# Patient Record
Sex: Male | Born: 1975 | Race: Black or African American | Hispanic: No | Smoking: Never smoker
Health system: Southern US, Community
[De-identification: ages and names within clinical notes are randomized; demographics above are authoritative.]

## PROBLEM LIST (undated history)

## (undated) DIAGNOSIS — S5401XA Injury of ulnar nerve at forearm level, right arm, initial encounter: Secondary | ICD-10-CM

## (undated) DIAGNOSIS — M1990 Unspecified osteoarthritis, unspecified site: Secondary | ICD-10-CM

## (undated) DIAGNOSIS — I82409 Acute embolism and thrombosis of unspecified deep veins of unspecified lower extremity: Secondary | ICD-10-CM

## (undated) DIAGNOSIS — IMO0002 Reserved for concepts with insufficient information to code with codable children: Secondary | ICD-10-CM

## (undated) DIAGNOSIS — I10 Essential (primary) hypertension: Secondary | ICD-10-CM

## (undated) DIAGNOSIS — F0781 Postconcussional syndrome: Secondary | ICD-10-CM

## (undated) DIAGNOSIS — S12500A Unspecified displaced fracture of sixth cervical vertebra, initial encounter for closed fracture: Secondary | ICD-10-CM

## (undated) DIAGNOSIS — E119 Type 2 diabetes mellitus without complications: Secondary | ICD-10-CM

## (undated) HISTORY — DX: Reserved for concepts with insufficient information to code with codable children: IMO0002

## (undated) HISTORY — DX: Essential (primary) hypertension: I10

## (undated) HISTORY — PX: SHOULDER ARTHROSCOPY: SHX128

## (undated) HISTORY — DX: Postconcussional syndrome: F07.81

## (undated) HISTORY — DX: Injury of ulnar nerve at forearm level, right arm, initial encounter: S54.01XA

## (undated) HISTORY — DX: Acute embolism and thrombosis of unspecified deep veins of unspecified lower extremity: I82.409

## (undated) HISTORY — DX: Unspecified displaced fracture of sixth cervical vertebra, initial encounter for closed fracture: S12.500A

## (undated) HISTORY — DX: Type 2 diabetes mellitus without complications: E11.9

## (undated) HISTORY — DX: Unspecified osteoarthritis, unspecified site: M19.90

## (undated) HISTORY — PX: KNEE ARTHROSCOPY: SUR90

---

## 2008-04-08 ENCOUNTER — Ambulatory Visit: Payer: Self-pay | Admitting: Internal Medicine

## 2008-04-08 DIAGNOSIS — E119 Type 2 diabetes mellitus without complications: Secondary | ICD-10-CM

## 2008-04-08 DIAGNOSIS — I152 Hypertension secondary to endocrine disorders: Secondary | ICD-10-CM | POA: Insufficient documentation

## 2008-04-08 DIAGNOSIS — I10 Essential (primary) hypertension: Secondary | ICD-10-CM

## 2008-04-08 DIAGNOSIS — M1990 Unspecified osteoarthritis, unspecified site: Secondary | ICD-10-CM

## 2008-04-08 DIAGNOSIS — E1159 Type 2 diabetes mellitus with other circulatory complications: Secondary | ICD-10-CM | POA: Insufficient documentation

## 2008-04-08 HISTORY — DX: Type 2 diabetes mellitus without complications: E11.9

## 2008-04-08 HISTORY — DX: Unspecified osteoarthritis, unspecified site: M19.90

## 2008-04-08 HISTORY — DX: Essential (primary) hypertension: I10

## 2008-04-08 LAB — CONVERTED CEMR LAB: Hgb A1c MFr Bld: 11 % — ABNORMAL HIGH (ref 4.6–6.0)

## 2008-04-11 ENCOUNTER — Telehealth (INDEPENDENT_AMBULATORY_CARE_PROVIDER_SITE_OTHER): Payer: Self-pay

## 2008-08-19 ENCOUNTER — Ambulatory Visit: Payer: Self-pay | Admitting: Internal Medicine

## 2008-08-19 LAB — HM DIABETES EYE EXAM: HM Diabetic Eye Exam: NORMAL

## 2008-09-05 ENCOUNTER — Encounter: Payer: Self-pay | Admitting: Internal Medicine

## 2008-10-05 ENCOUNTER — Telehealth: Payer: Self-pay | Admitting: Internal Medicine

## 2008-11-11 ENCOUNTER — Ambulatory Visit: Payer: Self-pay | Admitting: Internal Medicine

## 2009-07-14 ENCOUNTER — Telehealth: Payer: Self-pay | Admitting: Internal Medicine

## 2009-07-25 ENCOUNTER — Ambulatory Visit: Payer: Self-pay | Admitting: Internal Medicine

## 2009-07-25 LAB — CONVERTED CEMR LAB: Blood Glucose, Fingerstick: 236

## 2009-09-12 ENCOUNTER — Encounter: Admission: RE | Admit: 2009-09-12 | Discharge: 2009-09-12 | Payer: Self-pay | Admitting: Infectious Diseases

## 2010-01-08 ENCOUNTER — Telehealth: Payer: Self-pay | Admitting: Internal Medicine

## 2010-01-14 ENCOUNTER — Emergency Department (HOSPITAL_COMMUNITY): Admission: EM | Admit: 2010-01-14 | Discharge: 2010-01-15 | Payer: Self-pay | Admitting: Emergency Medicine

## 2010-03-27 NOTE — Assessment & Plan Note (Signed)
Summary: MED CHECK/REFILLS/CJR   Vital Signs:  Patient profile:   35 year old male Weight:      281 pounds Temp:     98.2 degrees F oral BP sitting:   100 / 80  (left arm) Cuff size:   large  Vitals Entered By: Duard Brady LPN (Jul 25, 2009 4:02 PM) CC: medication review, check blood sugar - not taking meds as scheduled Is Patient Diabetic? Yes CBG Result 236   CC:  medication review and check blood sugar - not taking meds as scheduled.  History of Present Illness: 35 year old patient who has a history of type 2 diabetes and hypertension.  He is not been seen here in the number of months.  He has been off most of his medication for per days to weeks.  Random blood sugar 236.  His last hemoglobin A1c last fall was in excess of 10.  he feels quite well. He has had a difficult time keeping appointments and maintaining a proper diet due to [place].  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 Amoxicillin (Amoxicillin)  Past History:  Past Medical History: Reviewed history from 04/08/2008 and no changes required. Diabetes mellitus, type II Hypertension Osteoarthritis  Family History: Reviewed history from 04/08/2008 and no changes required. father died age 32 of Legionella pneumonia, history of hypercholesterolemia mother is 23, hypothyroidism, history of colon cancer  Social History: Smoking Status:  never  Review of Systems       The patient complains of weight gain.  The patient denies anorexia, fever, weight loss, vision loss, decreased hearing, hoarseness, chest pain, syncope, dyspnea on exertion, peripheral edema, prolonged cough, headaches, hemoptysis, abdominal pain, melena, hematochezia, severe indigestion/heartburn, hematuria, incontinence, genital sores, muscle weakness, suspicious skin lesions, transient blindness, difficulty walking, depression,  unusual weight change, abnormal bleeding, enlarged lymph nodes, angioedema, breast masses, and testicular masses.    Physical Exam  General:  overweight-appearing.  blood pressure low normaloverweight-appearing.   Head:  Normocephalic and atraumatic without obvious abnormalities. No apparent alopecia or balding. Eyes:  No corneal or conjunctival inflammation noted. EOMI. Perrla. Funduscopic exam benign, without hemorrhages, exudates or papilledema. Vision grossly normal. Mouth:  Oral mucosa and oropharynx without lesions or exudates.  Teeth in good repair. Neck:  No deformities, masses, or tenderness noted. Lungs:  Normal respiratory effort, chest expands symmetrically. Lungs are clear to auscultation, no crackles or wheezes. Heart:  Normal rate and regular rhythm. S1 and S2 normal without gallop, murmur, click, rub or other extra sounds. Abdomen:  Bowel sounds positive,abdomen soft and non-tender without masses, organomegaly or hernias noted. Msk:  No deformity or scoliosis noted of thoracic or lumbar spine.   Pulses:  R and L carotid,radial,femoral,dorsalis pedis and posterior tibial pulses are full and equal bilaterally Skin:  Intact without suspicious lesions or rashes  Diabetes Management Exam:    Foot Exam (with socks and/or shoes not present):       Sensory-Pinprick/Light touch:          Left medial foot (L-4): normal          Left dorsal foot (L-5): normal          Left lateral foot (S-1): normal          Right medial foot (L-4): normal          Right dorsal foot (L-5): normal  Right lateral foot (S-1): normal       Sensory-Monofilament:          Left foot: normal          Right foot: normal       Inspection:          Left foot: normal          Right foot: normal       Nails:          Left foot: normal          Right foot: normal    Foot Exam by Podiatrist:       Date: 07/25/2009       Results: no diabetic findings       Done by: PCP   Complete Medication  List: 1)  Victoza 18 Mg/3ml Soln (Liraglutide) .... Use daily 2)  Actos 45 Mg Tabs (Pioglitazone hcl) .... One daily 3)  Metformin Hcl 1000 Mg Tabs (Metformin hcl) .... One twice daily 4)  Novofine 32g X 6 Mm Misc (Insulin pen needle) .... Uad 5)  Glipizide Xl 10 Mg Xr24h-tab (Glipizide) .... One daily 6)  Lisinopril 20 Mg Tabs (Lisinopril) .... One daily  Other Orders: Capillary Blood Glucose/CBG (64332)  Patient Instructions: 1)  Please schedule a follow-up appointment in 3 months. 2)  Limit your Sodium (Salt) to less than 2 grams a day(slightly less than 1/2 a teaspoon) to prevent fluid retention, swelling, or worsening of symptoms. 3)  It is important that you exercise regularly at least 20 minutes 5 times a week. If you develop chest pain, have severe difficulty breathing, or feel very tired , stop exercising immediately and seek medical attention. 4)  You need to lose weight. Consider a lower calorie diet and regular exercise.  5)  Check your blood sugars regularly. If your readings are usually above : or below 70 you should contact our office. 6)  It is important that your Diabetic A1c level is checked every 3 months. 7)  See your eye doctor yearly to check for diabetic eye damage. Prescriptions: LISINOPRIL 20 MG TABS (LISINOPRIL) one daily  #90 x 6   Entered and Authorized by:   [name]  MD   Signed by:   [name]  MD on 07/25/2009   Method used:   Electronically to        [facility] [address] [facility] [address] #[id number]* (retail)       [address] [place], [place]  [address]       Ph: [phone]       Fax: [phone]   RxID:   [id number] GLIPIZIDE XL 10 MG XR24H-TAB (GLIPIZIDE) one daily  #90 x 6   Entered and Authorized by:   [name]  MD   Signed by:   [name]  MD on 07/25/2009   Method used:   Electronically to        [facility] [address] #[id number]* (retail)       [address] [place], [place]  [address]       Ph:  [phone]       Fax: [phone]   RxID:   [id number] NOVOFINE 32G X 6 MM MISC (INSULIN PEN NEEDLE) UAD  #100 x 6   Entered and Authorized by:   [name]  MD   Signed by:   [name]  MD on 07/25/2009  Method used:   Electronically to        [facility] #[id number]* (retail)       [address] [place], [place]  [address]       Ph: [phone]       Fax: [phone]   RxID:   [id number] METFORMIN HCL 1000 MG TABS (METFORMIN HCL) one twice daily  #180 x 6   Entered and Authorized by:   [name]  MD   Signed by:   [name]  MD on 07/25/2009   Method used:   Electronically to        [facility] [address] #[id number]* (retail)       [address] [place], [place]  [address]       Ph: [phone]       Fax: [phone]   RxID:   [id number] ACTOS 45 MG TABS (PIOGLITAZONE HCL) one daily  #90 x 6   Entered and Authorized by:   [name]  MD   Signed by:   [name]  MD on 07/25/2009   Method used:   Electronically to        [facility] [address] #[id number]* (retail)       [address] [place], [place]  [address]       Ph: [phone]       Fax: [phone]   RxID:   [id number] JIRCVEL 38 MG/3ML SOLN (LIRAGLUTIDE) use daily  #3 x 6   Entered and Authorized by:   [name]  MD   Signed by:   [name]  MD on 07/25/2009   Method used:   Electronically to        [facility] [id number]* (retail)       [address] [place], [place]  [address]       Ph: [phone]       Fax: [phone]   RxID:   [id number]   Appended Document: MED CHECK/REFILLS/CJR Impression:  DM2- Poor control Noncompliance HTN

## 2010-03-27 NOTE — Progress Notes (Signed)
Summary: refill novofine needles   Phone Note Refill Request Message from:  Fax from Pharmacy on January 08, 2010 1:20 PM  Refills Requested: Medication #1:  NOVOFINE 32G X 6 MM MISC UAD [facility]    Method Requested: Fax to Local Pharmacy Initial call taken by: Duard Brady LPN,  January 08, 2010 1:20 PM    Prescriptions: NOVOFINE 32G X 6 MM MISC (INSULIN PEN NEEDLE) UAD  #100 x 6   Entered by:   Duard Brady LPN   Authorized by:   [name]  MD   Signed by:   Duard Brady LPN on 29/51/8841   Method used:   Historical   RxID:   [id number]  faxed back to [facility]   KIk

## 2010-03-27 NOTE — Progress Notes (Signed)
Summary: No Call No Show  Phone Note Outgoing Call   Call placed by: Duard Brady LPN,  Jul 14, 2009 4:58 PM Call placed to: Patient Summary of Call: NO Call No Show - rov , attempt to call - ans mach - LMTCB and r/s   Initial call taken by: Duard Brady LPN,  Jul 14, 2009 4:59 PM

## 2010-05-08 LAB — POCT I-STAT, CHEM 8
Calcium, Ion: 1.08 mmol/L — ABNORMAL LOW (ref 1.12–1.32)
Chloride: 100 mEq/L (ref 96–112)
Glucose, Bld: 450 mg/dL — ABNORMAL HIGH (ref 70–99)
HCT: 46 % (ref 39.0–52.0)
Hemoglobin: 15.6 g/dL (ref 13.0–17.0)
TCO2: 26 mmol/L (ref 0–100)

## 2010-05-08 LAB — CBC
HCT: 42.8 % (ref 39.0–52.0)
Hemoglobin: 15.2 g/dL (ref 13.0–17.0)
MCH: 31.4 pg (ref 26.0–34.0)
MCHC: 35.5 g/dL (ref 30.0–36.0)
MCV: 88.4 fL (ref 78.0–100.0)
RDW: 12.1 % (ref 11.5–15.5)

## 2010-05-08 LAB — DIFFERENTIAL
Basophils Absolute: 0 10*3/uL (ref 0.0–0.1)
Basophils Relative: 0 % (ref 0–1)
Eosinophils Absolute: 0.1 10*3/uL (ref 0.0–0.7)
Eosinophils Relative: 1 % (ref 0–5)
Monocytes Absolute: 0.9 10*3/uL (ref 0.1–1.0)
Monocytes Relative: 10 % (ref 3–12)
Neutro Abs: 5.2 10*3/uL (ref 1.7–7.7)

## 2010-05-08 LAB — GLUCOSE, CAPILLARY: Glucose-Capillary: 320 mg/dL — ABNORMAL HIGH (ref 70–99)

## 2010-08-12 ENCOUNTER — Other Ambulatory Visit: Payer: Self-pay | Admitting: Internal Medicine

## 2011-02-02 ENCOUNTER — Other Ambulatory Visit: Payer: Self-pay | Admitting: Internal Medicine

## 2011-02-04 NOTE — Telephone Encounter (Signed)
Must be seen for future refills - last seen 06/2009

## 2011-02-05 ENCOUNTER — Encounter: Payer: Self-pay | Admitting: Internal Medicine

## 2011-02-05 ENCOUNTER — Ambulatory Visit (INDEPENDENT_AMBULATORY_CARE_PROVIDER_SITE_OTHER): Payer: Self-pay | Admitting: Internal Medicine

## 2011-02-05 VITALS — BP 130/90 | Temp 98.3°F | Ht 76.25 in | Wt 257.0 lb

## 2011-02-05 DIAGNOSIS — I10 Essential (primary) hypertension: Secondary | ICD-10-CM

## 2011-02-05 DIAGNOSIS — E1169 Type 2 diabetes mellitus with other specified complication: Secondary | ICD-10-CM

## 2011-02-05 DIAGNOSIS — E119 Type 2 diabetes mellitus without complications: Secondary | ICD-10-CM

## 2011-02-05 MED ORDER — INSULIN PEN NEEDLE 32G X 6 MM MISC: Status: DC

## 2011-02-05 MED ORDER — GLIPIZIDE ER 10 MG PO TB24: 10.0000 mg | ORAL_TABLET | Freq: Every day | ORAL | Status: DC

## 2011-02-05 MED ORDER — METFORMIN HCL 1000 MG PO TABS: 1000.0000 mg | ORAL_TABLET | Freq: Two times a day (BID) | ORAL | Status: DC

## 2011-02-05 MED ORDER — LISINOPRIL 20 MG PO TABS: 20.0000 mg | ORAL_TABLET | Freq: Every day | ORAL | Status: DC

## 2011-02-05 MED ORDER — PIOGLITAZONE HCL 45 MG PO TABS: 45.0000 mg | ORAL_TABLET | Freq: Every day | ORAL | Status: DC

## 2011-02-05 MED ORDER — LIRAGLUTIDE 18 MG/3ML ~~LOC~~ SOLN: 1.2000 mg | SUBCUTANEOUS | Status: DC

## 2011-02-05 NOTE — Patient Instructions (Signed)
It is important that you exercise regularly, at least 20 minutes 3 to 4 times per week.  If you develop chest pain or shortness of breath seek  medical attention.   Please check your hemoglobin A1c every 3 months  You need to lose weight.  Consider a lower calorie diet and regular exercise. 

## 2011-02-05 NOTE — Progress Notes (Signed)
  Subjective:    Patient ID: Gene Weeks, male    DOB: 10-Dec-1975, 35 y.o.   MRN: [id number]  HPI  35 year old patient who has a history of type 2 diabetes. He has not been seen in over one year. He has lost his job and the health benefits and has been off of all his medications. He has lost a considerable amount of weight since his last visit here and is exercising regularly his prior weight was 281 and today 257 he has treated hypertension;  in general he feels well. 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  HENT: Negative for hearing loss, ear pain, congestion, sore throat, trouble swallowing, neck stiffness, dental problem, voice change and tinnitus.   Eyes: Negative for pain, discharge and visual disturbance.  Respiratory: Negative for cough, chest tightness, wheezing and stridor.   Cardiovascular: Negative for chest pain, palpitations and leg swelling.  Gastrointestinal: Negative for nausea, vomiting, abdominal pain, diarrhea, constipation, blood in stool and abdominal distention.  Genitourinary: Negative for urgency, hematuria, flank pain, discharge, difficulty urinating and genital sores.  Musculoskeletal: Negative for myalgias, back pain, joint swelling, arthralgias and gait problem.  Skin: Negative for rash.  Neurological: Negative for dizziness, syncope, speech difficulty, weakness, numbness and headaches.  Hematological: Negative for adenopathy. Does not bruise/bleed easily.  Psychiatric/Behavioral: Negative for behavioral problems and dysphoric mood. The patient is not nervous/anxious.        Objective:   Physical Exam  Constitutional: He is oriented to person, place, and time. He appears well-developed.  HENT:  Head: Normocephalic.  Right Ear: External ear normal.  Left Ear: External ear normal.  Eyes: Conjunctivae and EOM are normal.  Neck: Normal  range of motion.  Cardiovascular: Normal rate and normal heart sounds.   Pulmonary/Chest: Breath sounds normal.  Abdominal: Bowel sounds are normal.  Musculoskeletal: Normal range of motion. He exhibits no edema and no tenderness.  Neurological: He is alert and oriented to person, place, and time.  Psychiatric: He has a normal mood and affect. His behavior is normal.          Assessment & Plan:    Diabetes mellitus poor control off medications. Will resume all his medications and recheck in 1 month  Hypertension. We'll resume lisinopril   We'll continue efforts at exercise diet and weight loss

## 2011-04-15 ENCOUNTER — Other Ambulatory Visit: Payer: Self-pay | Admitting: Internal Medicine

## 2011-07-04 ENCOUNTER — Ambulatory Visit (INDEPENDENT_AMBULATORY_CARE_PROVIDER_SITE_OTHER): Payer: BC Managed Care – PPO | Admitting: Family

## 2011-07-04 ENCOUNTER — Encounter: Payer: Self-pay | Admitting: Family

## 2011-07-04 DIAGNOSIS — H6120 Impacted cerumen, unspecified ear: Secondary | ICD-10-CM

## 2011-07-04 DIAGNOSIS — J309 Allergic rhinitis, unspecified: Secondary | ICD-10-CM

## 2011-07-04 DIAGNOSIS — R221 Localized swelling, mass and lump, neck: Secondary | ICD-10-CM

## 2011-07-04 DIAGNOSIS — R220 Localized swelling, mass and lump, head: Secondary | ICD-10-CM

## 2011-07-04 MED ORDER — METHYLPREDNISOLONE 4 MG PO KIT: PACK | ORAL | Status: AC

## 2011-07-04 NOTE — Patient Instructions (Signed)
Allergic Rhinitis  Allergic rhinitis is when the mucous membranes in the nose respond to allergens. Allergens are particles in the air that cause your body to have an allergic reaction. This causes you to release allergic antibodies. Through a chain of events, these eventually cause you to release histamine into the blood stream (hence the use of antihistamines). Although meant to be protective to the body, it is this release that causes your discomfort, such as frequent sneezing, congestion and an itchy runny nose.    CAUSES    The pollen allergens may come from grasses, trees, and weeds. This is seasonal allergic rhinitis, or "hay fever." Other allergens cause year-round allergic rhinitis (perennial allergic rhinitis) such as house dust mite allergen, pet dander and mold spores.    SYMPTOMS     Nasal stuffiness (congestion).   Runny, itchy nose with sneezing and tearing of the eyes.   There is often an itching of the mouth, eyes and ears.  It cannot be cured, but it can be controlled with medications.  DIAGNOSIS    If you are unable to determine the offending allergen, skin or blood testing may find it.  TREATMENT     Avoid the allergen.   Medications and allergy shots (immunotherapy) can help.   Hay fever may often be treated with antihistamines in pill or nasal spray forms. Antihistamines block the effects of histamine. There are over-the-counter medicines that may help with nasal congestion and swelling around the eyes. Check with your caregiver before taking or giving this medicine.  If the treatment above does not work, there are many new medications your caregiver can prescribe. Stronger medications may be used if initial measures are ineffective. Desensitizing injections can be used if medications and avoidance fails. Desensitization is when a patient is given ongoing shots until the body becomes less sensitive to the allergen. Make sure you follow up with your caregiver if problems continue.  SEEK  MEDICAL CARE IF:     You develop fever (more than 100.5 F (38.1 C).   You develop a cough that does not stop easily (persistent).   You have shortness of breath.   You start wheezing.   Symptoms interfere with normal daily activities.  Document Released: 11/06/2000 Document Revised: 01/31/2011 Document Reviewed: 05/18/2008  [employer] Patient Information 2012 [employer].

## 2011-07-04 NOTE — Progress Notes (Signed)
Subjective:    Patient ID: Gene Weeks, male    DOB: Dec 09, 1975, 36 y.o.   MRN: [id number]  HPI 36 year old [place] American male, nonsmoker, patient of Dr. Kirtland Bouchard. is in today with complaints of a mass in his left neck times one week. He recently had an allergy flare with sneezing, coughing, and congestion. She's taken Zyrtec and feels much better today. However, the mass in his neck has not gone away. He also believes he has mild left side swelling to his face. He is allergic to pollen and dust.  Patient will acute ears checked. He believes he has a cerumen impaction. Has a history of cerumen impaction.   Review of Systems  Constitutional: Negative.   HENT: Positive for hearing loss, congestion, sneezing and postnasal drip.        Palpable lesion to the left neck x 1 week.   Eyes: Negative.   Respiratory: Negative.   Cardiovascular: Negative.   Gastrointestinal: Negative.   Neurological: Negative.   Psychiatric/Behavioral: Negative.    Past Medical History  Diagnosis Date  . DIABETES MELLITUS, TYPE II 04/08/2008  . HYPERTENSION 04/08/2008  . OSTEOARTHRITIS 04/08/2008    History   Social History  . Marital Status: Single    Spouse Name: N/A    Number of Children: N/A  . Years of Education: N/A   Occupational History  . Not on file.   Social History Main Topics  . Smoking status: Never Smoker   . Smokeless tobacco: Never Used  . Alcohol Use: Yes  . Drug Use: No  . Sexually Active: Not on file   Other Topics Concern  . Not on file   Social History Narrative  . No narrative on file    Past Surgical History  Procedure Date  . Knee arthroscopy     left    Family History  Problem Relation Age of Onset  . Cancer Mother     colon ca  . Hypothyroidism Mother   . Hyperlipidemia Father     Allergies  Allergen Reactions  . Amoxicillin     Current Outpatient Prescriptions on File Prior to Visit  Medication Sig Dispense Refill  . glipiZIDE (GLUCOTROL XL) 10 MG  24 hr tablet Take 1 tablet (10 mg total) by mouth daily.  90 tablet  2  . Insulin Pen Needle (NOVOFINE) 32G X 6 MM MISC These daily as directed  100 each  6  . Liraglutide (VICTOZA) 18 MG/3ML SOLN Inject 0.2 mLs (1.2 mg total) into the skin 1 day or 1 dose.  6 mL  4  . lisinopril (PRINIVIL,ZESTRIL) 20 MG tablet Take 1 tablet (20 mg total) by mouth daily.  90 tablet  5  . metFORMIN (GLUCOPHAGE) 1000 MG tablet Take 1 tablet (1,000 mg total) by mouth 2 (two) times daily with a meal.  120 tablet  4  . pioglitazone (ACTOS) 45 MG tablet Take 1 tablet (45 mg total) by mouth daily.  90 tablet  5  . VICTOZA 18 MG/3ML SOLN USE DAILY AS DIRECTED  6 mL  1    BP 118/90  Temp(Src) 98.8 F (37.1 C) (Oral)  Wt 255 lb (115.667 kg)chart    Objective:   Physical Exam  Constitutional: He is oriented to person, place, and time. He appears well-developed and well-nourished.  HENT:  Right Ear: External ear normal.  Left Ear: External ear normal.  Nose: Nose normal.  Mouth/Throat: Oropharynx is clear and moist.  Neck: Normal range of motion. Neck supple.  Small somewhat mobile mass to the left neck underneath the jawline. Tender to touch. Approximately 1 cm.  Cardiovascular: Normal rate, regular rhythm and normal heart sounds.   Pulmonary/Chest: Effort normal and breath sounds normal.  Abdominal: Soft.  Musculoskeletal: Normal range of motion.  Neurological: He is alert and oriented to person, place, and time.  Skin: Skin is warm and dry.  Psychiatric: He has a normal mood and affect.      Informed consent was obtained and peroxide gel was inserted into the ears bilaterally using the lavage kit the ears were lavaged until clean.Inspection with a cerumen spoon removed residual wax. Patient tolerated the procedure well.    Assessment & Plan:  Assessment: Left neck mass, Likely a swollen node, Allergic Rhinitis, Cerumen Impaction  Plan:Medrol dosepak as directed. Consider an [place] or CT of the neck if  the mass does not resolve in 1 week. Patient will call the office and let us know.

## 2011-07-10 ENCOUNTER — Ambulatory Visit: Payer: BC Managed Care – PPO | Admitting: Family

## 2011-07-11 ENCOUNTER — Ambulatory Visit (INDEPENDENT_AMBULATORY_CARE_PROVIDER_SITE_OTHER)
Admission: RE | Admit: 2011-07-11 | Discharge: 2011-07-11 | Disposition: A | Discharge status: Home / Self Care | Payer: BC Managed Care – PPO | Source: Ambulatory Visit | Attending: Family | Admitting: Family

## 2011-07-11 ENCOUNTER — Ambulatory Visit (INDEPENDENT_AMBULATORY_CARE_PROVIDER_SITE_OTHER): Payer: BC Managed Care – PPO | Admitting: Family

## 2011-07-11 ENCOUNTER — Other Ambulatory Visit: Payer: Self-pay

## 2011-07-11 DIAGNOSIS — R220 Localized swelling, mass and lump, head: Secondary | ICD-10-CM

## 2011-07-11 DIAGNOSIS — I10 Essential (primary) hypertension: Secondary | ICD-10-CM

## 2011-07-11 DIAGNOSIS — R221 Localized swelling, mass and lump, neck: Secondary | ICD-10-CM

## 2011-07-11 DIAGNOSIS — E119 Type 2 diabetes mellitus without complications: Secondary | ICD-10-CM

## 2011-07-11 LAB — COMPREHENSIVE METABOLIC PANEL
AST: 23 U/L (ref 0–37)
Albumin: 4.2 g/dL (ref 3.5–5.2)
Alkaline Phosphatase: 73 U/L (ref 39–117)
BUN: 15 mg/dL (ref 6–23)
Calcium: 9.4 mg/dL (ref 8.4–10.5)
Chloride: 99 mEq/L (ref 96–112)
Glucose, Bld: 290 mg/dL — ABNORMAL HIGH (ref 70–99)
Potassium: 4.3 mEq/L (ref 3.5–5.1)
Sodium: 135 mEq/L (ref 135–145)
Total Protein: 7.3 g/dL (ref 6.0–8.3)

## 2011-07-11 MED ORDER — IOHEXOL 300 MG/ML  SOLN
75.0000 mL | Freq: Once | INTRAMUSCULAR | Status: AC | PRN
  Administered 2011-07-11: 75 mL via INTRAVENOUS

## 2011-07-11 NOTE — Progress Notes (Signed)
  Subjective:    Patient ID: Gene Weeks, male    DOB: 1975/11/03, 36 y.o.   MRN: [id number]  HPI    Review of Systems     Objective:   Physical Exam  Error, no OV      Assessment & Plan:

## 2011-07-12 ENCOUNTER — Other Ambulatory Visit: Payer: Self-pay | Admitting: Family

## 2011-07-12 ENCOUNTER — Telehealth: Payer: Self-pay | Admitting: Family

## 2011-07-12 MED ORDER — DOXYCYCLINE HYCLATE 100 MG PO TABS: 100.0000 mg | ORAL_TABLET | Freq: Two times a day (BID) | ORAL | Status: AC

## 2011-07-12 NOTE — Telephone Encounter (Signed)
Pt would like ct scan of neck results. °

## 2011-07-12 NOTE — Telephone Encounter (Signed)
See lab or CT note

## 2011-09-23 ENCOUNTER — Other Ambulatory Visit: Payer: Self-pay | Admitting: Internal Medicine

## 2011-12-04 ENCOUNTER — Other Ambulatory Visit: Payer: Self-pay | Admitting: Internal Medicine

## 2012-01-22 ENCOUNTER — Other Ambulatory Visit: Payer: Self-pay | Admitting: Internal Medicine

## 2012-03-23 ENCOUNTER — Ambulatory Visit (INDEPENDENT_AMBULATORY_CARE_PROVIDER_SITE_OTHER): Payer: BC Managed Care – PPO | Admitting: Family Medicine

## 2012-03-23 ENCOUNTER — Encounter: Payer: Self-pay | Admitting: Family Medicine

## 2012-03-23 VITALS — BP 122/80 | HR 119 | Temp 98.1°F | Wt 263.0 lb

## 2012-03-23 DIAGNOSIS — G589 Mononeuropathy, unspecified: Secondary | ICD-10-CM

## 2012-03-23 DIAGNOSIS — E119 Type 2 diabetes mellitus without complications: Secondary | ICD-10-CM

## 2012-03-23 DIAGNOSIS — G629 Polyneuropathy, unspecified: Secondary | ICD-10-CM

## 2012-03-23 LAB — BASIC METABOLIC PANEL
BUN: 19 mg/dL (ref 6–23)
Calcium: 9.6 mg/dL (ref 8.4–10.5)
GFR: 109.54 mL/min (ref >60.00)
Glucose, Bld: 174 mg/dL — ABNORMAL HIGH (ref 70–99)
Sodium: 138 mEq/L (ref 135–145)

## 2012-03-23 LAB — HEPATIC FUNCTION PANEL
ALT: 25 U/L (ref 0–53)
AST: 22 U/L (ref 0–37)
Albumin: 4.3 g/dL (ref 3.5–5.2)

## 2012-03-23 LAB — CBC WITH DIFFERENTIAL/PLATELET
Basophils Absolute: 0 10*3/uL (ref 0.0–0.1)
Eosinophils Relative: 2.6 % (ref 0.0–5.0)
Lymphocytes Relative: 36.2 % (ref 12.0–46.0)
Lymphs Abs: 2.4 10*3/uL (ref 0.7–4.0)
Monocytes Relative: 6.6 % (ref 3.0–12.0)
Neutrophils Relative %: 54.1 % (ref 43.0–77.0)
Platelets: 212 10*3/uL (ref 150.0–400.0)
RDW: 12.7 % (ref 11.5–14.6)
WBC: 6.7 10*3/uL (ref 4.5–10.5)

## 2012-03-23 LAB — TSH: TSH: 0.17 u[IU]/mL — ABNORMAL LOW (ref 0.35–5.50)

## 2012-03-23 LAB — HEMOGLOBIN A1C: Hgb A1c MFr Bld: 11.7 % — ABNORMAL HIGH (ref 4.6–6.5)

## 2012-03-23 LAB — VITAMIN B12: Vitamin B-12: 495 pg/mL (ref 211–911)

## 2012-03-23 NOTE — Progress Notes (Signed)
  Subjective:    Patient ID: Gene Weeks, male    DOB: 12-17-1975, 37 y.o.   MRN: [id number]  HPI Here to discuss numbness in the feet which started about 3 months ago. The left foot is more involved than the right one. No burning or pain. No hand symptoms. He admits that he does not pay very much attention to his diabetes. He exercises but eats a poor diet. He does not check his glucoses at home. His last A1c from May 2013 was over 11.    Review of Systems  Constitutional: Negative.   Respiratory: Negative.   Cardiovascular: Negative.   Neurological: Positive for numbness. Negative for dizziness, tremors, seizures, syncope, facial asymmetry, speech difficulty, weakness, light-headedness and headaches.       Objective:   Physical Exam  Constitutional: He is oriented to person, place, and time. He appears well-developed and well-nourished.  Cardiovascular: Normal rate, regular rhythm, normal heart sounds and intact distal pulses.   Pulmonary/Chest: Effort normal and breath sounds normal.  Neurological: He is alert and oriented to person, place, and time.       Light touch is slightly decreased in both feet. Distal pulses are full. Skin is warm.           Assessment & Plan:  This looks like classic diabetic neuropathy. We will screen for other etiologies today including thyroid problems and B12 deficiency. I reminded him that he needs to follow a strict low calorie diet. Check an A1c today. He needs to follow up with Dr. Amador Cunas soon.

## 2012-03-27 NOTE — Progress Notes (Signed)
Quick Note:  I left voice message with results. ______ 

## 2012-03-31 ENCOUNTER — Other Ambulatory Visit: Payer: Self-pay | Admitting: Internal Medicine

## 2012-04-29 ENCOUNTER — Other Ambulatory Visit: Payer: Self-pay | Admitting: *Deleted

## 2012-04-29 MED ORDER — LISINOPRIL 20 MG PO TABS: 20.0000 mg | ORAL_TABLET | Freq: Every day | ORAL | Status: DC

## 2012-05-05 ENCOUNTER — Emergency Department (HOSPITAL_COMMUNITY)
Admission: EM | Admit: 2012-05-05 | Discharge: 2012-05-06 | Disposition: A | Discharge status: Home / Self Care | Payer: BC Managed Care – PPO | Attending: Emergency Medicine | Admitting: Emergency Medicine

## 2012-05-05 ENCOUNTER — Encounter (HOSPITAL_COMMUNITY): Payer: Self-pay | Admitting: Physical Medicine and Rehabilitation

## 2012-05-05 DIAGNOSIS — R1084 Generalized abdominal pain: Secondary | ICD-10-CM | POA: Insufficient documentation

## 2012-05-05 DIAGNOSIS — Z79899 Other long term (current) drug therapy: Secondary | ICD-10-CM | POA: Insufficient documentation

## 2012-05-05 DIAGNOSIS — Z8739 Personal history of other diseases of the musculoskeletal system and connective tissue: Secondary | ICD-10-CM | POA: Insufficient documentation

## 2012-05-05 DIAGNOSIS — I10 Essential (primary) hypertension: Secondary | ICD-10-CM | POA: Insufficient documentation

## 2012-05-05 DIAGNOSIS — E119 Type 2 diabetes mellitus without complications: Secondary | ICD-10-CM | POA: Insufficient documentation

## 2012-05-05 DIAGNOSIS — Z794 Long term (current) use of insulin: Secondary | ICD-10-CM | POA: Insufficient documentation

## 2012-05-05 DIAGNOSIS — R112 Nausea with vomiting, unspecified: Secondary | ICD-10-CM

## 2012-05-05 DIAGNOSIS — R197 Diarrhea, unspecified: Secondary | ICD-10-CM | POA: Insufficient documentation

## 2012-05-05 DIAGNOSIS — R509 Fever, unspecified: Secondary | ICD-10-CM

## 2012-05-05 DIAGNOSIS — R000 Tachycardia, unspecified: Secondary | ICD-10-CM | POA: Insufficient documentation

## 2012-05-05 LAB — CBC WITH DIFFERENTIAL/PLATELET
Basophils Absolute: 0 10*3/uL (ref 0.0–0.1)
Basophils Relative: 0 % (ref 0–1)
Eosinophils Absolute: 0.1 10*3/uL (ref 0.0–0.7)
Eosinophils Relative: 2 % (ref 0–5)
HCT: 44.3 % (ref 39.0–52.0)
Hemoglobin: 16.2 g/dL (ref 13.0–17.0)
Lymphocytes Relative: 8 % — ABNORMAL LOW (ref 12–46)
Lymphs Abs: 0.5 10*3/uL — ABNORMAL LOW (ref 0.7–4.0)
MCH: 32.5 pg (ref 26.0–34.0)
MCHC: 36.6 g/dL — ABNORMAL HIGH (ref 30.0–36.0)
MCV: 89 fL (ref 78.0–100.0)
Monocytes Absolute: 0.4 10*3/uL (ref 0.1–1.0)
Monocytes Relative: 6 % (ref 3–12)
Neutro Abs: 5.2 10*3/uL (ref 1.7–7.7)
Neutrophils Relative %: 84 % — ABNORMAL HIGH (ref 43–77)
Platelets: 187 10*3/uL (ref 150–400)
RBC: 4.98 MIL/uL (ref 4.22–5.81)
RDW: 12.7 % (ref 11.5–15.5)
WBC: 6.3 10*3/uL (ref 4.0–10.5)

## 2012-05-05 LAB — URINALYSIS, ROUTINE W REFLEX MICROSCOPIC
Bilirubin Urine: NEGATIVE
Glucose, UA: 500 mg/dL — AB
Hgb urine dipstick: NEGATIVE
Ketones, ur: 15 mg/dL — AB
Leukocytes, UA: NEGATIVE
Nitrite: NEGATIVE
Protein, ur: NEGATIVE mg/dL
Specific Gravity, Urine: 1.031 — ABNORMAL HIGH (ref 1.005–1.030)
Urobilinogen, UA: 0.2 mg/dL (ref 0.0–1.0)
pH: 5 (ref 5.0–8.0)

## 2012-05-05 LAB — COMPREHENSIVE METABOLIC PANEL
ALT: 21 U/L (ref 0–53)
AST: 20 U/L (ref 0–37)
Albumin: 4.2 g/dL (ref 3.5–5.2)
Alkaline Phosphatase: 69 U/L (ref 39–117)
BUN: 15 mg/dL (ref 6–23)
CO2: 26 mEq/L (ref 19–32)
Calcium: 9.7 mg/dL (ref 8.4–10.5)
Chloride: 100 mEq/L (ref 96–112)
Creatinine, Ser: 1.05 mg/dL (ref 0.50–1.35)
GFR calc Af Amer: >90 mL/min (ref >90)
GFR calc non Af Amer: 90 mL/min — ABNORMAL LOW (ref >90)
Glucose, Bld: 204 mg/dL — ABNORMAL HIGH (ref 70–99)
Potassium: 4.1 mEq/L (ref 3.5–5.1)
Sodium: 137 mEq/L (ref 135–145)
Total Bilirubin: 0.9 mg/dL (ref 0.3–1.2)
Total Protein: 7.7 g/dL (ref 6.0–8.3)

## 2012-05-05 LAB — LIPASE, BLOOD: Lipase: 21 U/L (ref 11–59)

## 2012-05-05 LAB — GLUCOSE, CAPILLARY: Glucose-Capillary: 196 mg/dL — ABNORMAL HIGH (ref 70–99)

## 2012-05-05 MED ORDER — ACETAMINOPHEN 325 MG PO TABS
650.0000 mg | ORAL_TABLET | Freq: Once | ORAL | Status: AC
Start: 2012-05-05 — End: 2012-05-05
  Administered 2012-05-05: 650 mg via ORAL
  Filled 2012-05-05: qty 2

## 2012-05-05 MED ORDER — ONDANSETRON HCL 4 MG PO TABS: 4.0000 mg | ORAL_TABLET | Freq: Four times a day (QID) | ORAL | Status: DC

## 2012-05-05 MED ORDER — ONDANSETRON HCL 4 MG/2ML IJ SOLN
4.0000 mg | Freq: Once | INTRAMUSCULAR | Status: AC
  Administered 2012-05-05: 4 mg via INTRAVENOUS
  Filled 2012-05-05: qty 2

## 2012-05-05 MED ORDER — SODIUM CHLORIDE 0.9 % IV BOLUS (SEPSIS)
2000.0000 mL | Freq: Once | INTRAVENOUS | Status: AC
  Administered 2012-05-05: 2000 mL via INTRAVENOUS

## 2012-05-05 NOTE — ED Notes (Signed)
Informed pt to take tylenol for fever. Verbalized understanding. Educated pt on need for diabetes medication.

## 2012-05-05 NOTE — ED Notes (Signed)
MD at bedside. 

## 2012-05-05 NOTE — ED Notes (Signed)
CBG 196 

## 2012-05-05 NOTE — ED Notes (Signed)
Pt presents to department for evaluation of diffuse abdominal pain and N/V/D. Onset this morning. Also states elevated blood sugar. 8/10 pain at the time. Denies urinary symptoms. Pt is alert and oriented x4. Skin warm and dry.

## 2012-05-05 NOTE — ED Notes (Signed)
Alecia Lemming (roommate): [phone]

## 2012-05-05 NOTE — ED Notes (Signed)
Pt. C/o N/V/D starting at 0900 this AM with midabdominal pain, tender to palpation. Pt also reports hx of diabetes, takes PO medicine, states PCP will be changing to insulin soon. Reports feet "feeling numb" occasionally.

## 2012-05-07 NOTE — ED Provider Notes (Addendum)
History     37 year old male with nausea, vomiting and diarrhea since around 9 o clock this morning. Multiple episodes of each. No blood in his emesis or stool. Diffuse crampy, pain. Waxes wanes but does not completely go away. No appreciable exacerbating factors. The urinary complaints. No sick contacts. Subjective fever.diabetic.  CSN: [id number]  Arrival date & time 05/05/12  1478   First MD Initiated Contact with Patient 05/05/12 1939      Chief Complaint  Patient presents with  . Nausea  . Emesis  . Diarrhea  . Abdominal Pain    (Consider location/radiation/quality/duration/timing/severity/associated sxs/prior treatment) HPI  Past Medical History  Diagnosis Date  . DIABETES MELLITUS, TYPE II 04/08/2008  . HYPERTENSION 04/08/2008  . OSTEOARTHRITIS 04/08/2008    Past Surgical History  Procedure Laterality Date  . Knee arthroscopy      left    Family History  Problem Relation Age of Onset  . Cancer Mother     colon ca  . Hypothyroidism Mother   . Hyperlipidemia Father     History  Substance Use Topics  . Smoking status: Never Smoker   . Smokeless tobacco: Never Used  . Alcohol Use: No      Review of Systems  All systems reviewed and negative, other than as noted in HPI.   Allergies  Amoxicillin  Home Medications   Current Outpatient Rx  Name  Route  Sig  Dispense  Refill  . glipiZIDE (GLUCOTROL XL) 10 MG 24 hr tablet   Oral   Take 1 tablet (10 mg total) by mouth daily.   90 tablet   2     Must be seen for future refills- last seen 06/2009   . Insulin Pen Needle (NOVOFINE) 32G X 6 MM MISC      These daily as directed   100 each   6     Must be seen for future refills - last seen 06/2009   . Liraglutide (VICTOZA) 18 MG/3ML SOLN injection   Subcutaneous   Inject 0.6 mg into the skin daily.         . ondansetron (ZOFRAN) 4 MG tablet   Oral   Take 1 tablet (4 mg total) by mouth every 6 (six) hours.   12 tablet   0     BP 141/79   Pulse 108  Temp(Src) 100.7 F (38.2 C) (Oral)  Resp 20  SpO2 99%  Physical Exam  Nursing note and vitals reviewed. Constitutional: He appears well-developed and well-nourished. No distress.  HENT:  Head: Normocephalic and atraumatic.  Eyes: Conjunctivae are normal. Right eye exhibits no discharge. Left eye exhibits no discharge.  Neck: Neck supple.  Cardiovascular: Regular rhythm and normal heart sounds.  Exam reveals no gallop and no friction rub.   No murmur heard. tachycardia  Pulmonary/Chest: Effort normal and breath sounds normal. No respiratory distress.  Abdominal: Soft. He exhibits no distension. There is tenderness. There is no rebound and no guarding.  Mild diffuse tenderness  Musculoskeletal: He exhibits no edema and no tenderness.  Neurological: He is alert.  Skin: Skin is warm and dry.  Psychiatric: He has a normal mood and affect. His behavior is normal. Thought content normal.    ED Course  Procedures (including critical care time)  Labs Reviewed  CBC WITH DIFFERENTIAL - Abnormal; Notable for the following:    MCHC 36.6 (*)    Neutrophils Relative 84 (*)    Lymphocytes Relative 8 (*)    Lymphs Abs  0.5 (*)    All other components within normal limits  COMPREHENSIVE METABOLIC PANEL - Abnormal; Notable for the following:    Glucose, Bld 204 (*)    GFR calc non Af Amer 90 (*)    All other components within normal limits  URINALYSIS, ROUTINE W REFLEX MICROSCOPIC - Abnormal; Notable for the following:    Specific Gravity, Urine 1.031 (*)    Glucose, UA 500 (*)    Ketones, ur 15 (*)    All other components within normal limits  GLUCOSE, CAPILLARY - Abnormal; Notable for the following:    Glucose-Capillary 196 (*)    All other components within normal limits  LIPASE, BLOOD   No results found.   1. Nausea vomiting and diarrhea   2. Fever       MDM  36yM with n/v/d and fever. Suspect viral illness. Mild diffuse tenderness on exam. Not peritoneal. Labs  reassuring. Tachycardia improved with IVF as well are symptoms. i feel he is safe for DC. Return precautions discussed.         Raeford Razor, MD 05/07/12 [id number]  Raeford Razor, MD 05/07/12 [id number]

## 2012-05-08 ENCOUNTER — Other Ambulatory Visit: Payer: Self-pay | Admitting: Internal Medicine

## 2012-05-08 ENCOUNTER — Ambulatory Visit (INDEPENDENT_AMBULATORY_CARE_PROVIDER_SITE_OTHER): Payer: BC Managed Care – PPO | Admitting: Internal Medicine

## 2012-05-08 ENCOUNTER — Encounter: Payer: Self-pay | Admitting: Internal Medicine

## 2012-05-08 VITALS — BP 140/90 | HR 108 | Temp 98.1°F | Resp 20 | Wt 265.0 lb

## 2012-05-08 DIAGNOSIS — I10 Essential (primary) hypertension: Secondary | ICD-10-CM

## 2012-05-08 DIAGNOSIS — E119 Type 2 diabetes mellitus without complications: Secondary | ICD-10-CM

## 2012-05-08 MED ORDER — SILDENAFIL CITRATE 100 MG PO TABS: 50.0000 mg | ORAL_TABLET | Freq: Every day | ORAL | Status: DC | PRN

## 2012-05-08 MED ORDER — TRAMADOL HCL 50 MG PO TABS: 50.0000 mg | ORAL_TABLET | Freq: Three times a day (TID) | ORAL | Status: DC | PRN

## 2012-05-08 MED ORDER — METFORMIN HCL ER 500 MG PO TB24: 1000.0000 mg | ORAL_TABLET | Freq: Every day | ORAL | Status: DC

## 2012-05-08 MED ORDER — GLUCOSE BLOOD VI STRP: 1.0000 | ORAL_STRIP | Freq: Two times a day (BID) | Status: DC | PRN

## 2012-05-08 MED ORDER — FREESTYLE LANCETS MISC: 1.0000 | Freq: Two times a day (BID) | Status: DC | PRN

## 2012-05-08 NOTE — Progress Notes (Signed)
Subjective:    Patient ID: Gene Weeks, male    DOB: Sep 08, 1975, 37 y.o.   MRN: [id number]  HPI  37 year old patient who has long-standing diabetes which has not been well tolerated. He has not been seen for followup in some time. He hasn't taken his medication sporadically mainly for cost concerns. He does use glipizide daily but has discontinue metformin do to periodic GI upset. He remains on Victoza but on a submaximal dose of 0.6 mg but he takes this only sporadically. He states when he exercises regularly and follows his diet blood sugars are occasionally reasonably controlled. He states his fasting blood sugar yesterday 138. Hemoglobin A1c is have been consistently greater than 11  Past Medical History  Diagnosis Date  . DIABETES MELLITUS, TYPE II 04/08/2008  . HYPERTENSION 04/08/2008  . OSTEOARTHRITIS 04/08/2008    History   Social History  . Marital Status: Single    Spouse Name: N/A    Number of Children: N/A  . Years of Education: N/A   Occupational History  . Not on file.   Social History Main Topics  . Smoking status: Never Smoker   . Smokeless tobacco: Never Used  . Alcohol Use: No  . Drug Use: No  . Sexually Active: Not on file   Other Topics Concern  . Not on file   Social History Narrative  . No narrative on file    Past Surgical History  Procedure Laterality Date  . Knee arthroscopy      left    Family History  Problem Relation Age of Onset  . Cancer Mother     colon ca  . Hypothyroidism Mother   . Hyperlipidemia Father     Allergies  Allergen Reactions  . Amoxicillin     Current Outpatient Prescriptions on File Prior to Visit  Medication Sig Dispense Refill  . glipiZIDE (GLUCOTROL XL) 10 MG 24 hr tablet Take 1 tablet (10 mg total) by mouth daily.  90 tablet  2  . Insulin Pen Needle (NOVOFINE) 32G X 6 MM MISC These daily as directed  100 each  6  . Liraglutide (VICTOZA) 18 MG/3ML SOLN injection Inject 0.6 mg into the skin daily.       No  current facility-administered medications on file prior to visit.    BP 140/90  Pulse 108  Temp(Src) 98.1 F (36.7 C) (Oral)  Resp 20  Wt 265 lb (120.203 kg)  BMI 32.04 kg/m2  SpO2 98%        Review of Systems  Constitutional: Negative for fever, chills, appetite change and fatigue.  HENT: Negative for hearing loss, ear pain, congestion, sore throat, trouble swallowing, neck stiffness, dental problem, voice change and tinnitus.   Eyes: Negative for pain, discharge and visual disturbance.  Respiratory: Negative for cough, chest tightness, wheezing and stridor.   Cardiovascular: Negative for chest pain, palpitations and leg swelling.  Gastrointestinal: Negative for nausea, vomiting, abdominal pain, diarrhea, constipation, blood in stool and abdominal distention.  Genitourinary: Negative for urgency, hematuria, flank pain, discharge, difficulty urinating and genital sores.  Musculoskeletal: Negative for myalgias, back pain, joint swelling, arthralgias and gait problem.  Skin: Negative for rash.  Neurological: Positive for numbness. Negative for dizziness, syncope, speech difficulty, weakness and headaches.  Hematological: Negative for adenopathy. Does not bruise/bleed easily.  Psychiatric/Behavioral: Negative for behavioral problems and dysphoric mood. The patient is not nervous/anxious.        Objective:   Physical Exam  Constitutional: He is oriented to person,  place, and time. He appears well-nourished. He appears distressed.  HENT:  Head: Normocephalic.  Right Ear: External ear normal.  Left Ear: External ear normal.  Eyes: Conjunctivae and EOM are normal.  Neck: Normal range of motion.  Cardiovascular: Normal rate and normal heart sounds.   Pulmonary/Chest: Breath sounds normal.  Abdominal: Bowel sounds are normal.  Musculoskeletal: Normal range of motion. He exhibits no edema and no tenderness.  Neurological: He is alert and oriented to person, place, and time.   Pedal pulses full Intact to vibratory sensation and monofilament testing  Psychiatric: He has a normal mood and affect. His behavior is normal.          Assessment & Plan:   Diabetes mellitus. Poor control. Possible early diabetic neuropathy. Importance of good diabetic control discussed at length  We'll continue glipizide 10 mg daily We'll resume metformin extended release. We'll start treatment with 2 500 mg tablets once daily and attempt to up titrate to 2 g daily but may be limited by GI side effects Increased Victoza  To 1.2 mg daily. If this is well tolerated increase to 1.6 in 2 weeks  Recheck 1 month

## 2012-05-08 NOTE — Patient Instructions (Addendum)
Limit your sodium (Salt) intake    It is important that you exercise regularly, at least 20 minutes 3 to 4 times per week.  If you develop chest pain or shortness of breath seek  medical attention.   Please check your hemoglobin A1c every 3 months  Metformin XL 500:  Titrate to 3 daily.  If this is well tolerated titrate to 4 daily (2 tablets twice daily)  Victoza-  increase dose to 1.2;  if this is well tolerated increase to the full dose of 1.8

## 2012-05-11 ENCOUNTER — Telehealth: Payer: Self-pay | Admitting: *Deleted

## 2012-05-11 MED ORDER — INSULIN PEN NEEDLE 32G X 6 MM MISC: Status: DC

## 2012-05-11 NOTE — Telephone Encounter (Signed)
Rx refill sent to pharmacy. 

## 2012-05-18 ENCOUNTER — Telehealth: Payer: Self-pay | Admitting: Internal Medicine

## 2012-05-18 MED ORDER — GLUCOSE BLOOD VI STRP: 1.0000 | ORAL_STRIP | Freq: Four times a day (QID) | Status: DC | PRN

## 2012-05-18 NOTE — Telephone Encounter (Signed)
Left message on voicemail.

## 2012-05-18 NOTE — Telephone Encounter (Signed)
Left detailed message for pt new Rx for test strips sent to pharmacy and have 1 box of test strips for pt to pick up here. Rx sent to pharmacy.

## 2012-05-18 NOTE — Telephone Encounter (Signed)
Patient called stating that he need samples of test strips for his free style lite glucose meter. Patient's rx states that he tests twice per day and patient is actually testing 4 times per day and has run completely out. Please assist.

## 2012-05-18 NOTE — Telephone Encounter (Signed)
Pt came by office told him sent new Rx to pharmacy for Test strips and gave him one bottle.

## 2012-05-21 ENCOUNTER — Other Ambulatory Visit: Payer: Self-pay | Admitting: *Deleted

## 2012-05-21 MED ORDER — GLUCOSE BLOOD VI STRP: 1.0000 | ORAL_STRIP | Freq: Four times a day (QID) | Status: DC | PRN

## 2012-05-22 ENCOUNTER — Telehealth: Payer: Self-pay | Admitting: Internal Medicine

## 2012-05-22 NOTE — Telephone Encounter (Signed)
Left message on voicemail to call office.  

## 2012-05-22 NOTE — Telephone Encounter (Signed)
[employer] does not cover FREESTYLE LITE test strips. Preferred strips are: Child psychotherapist) or Life Scan (One Touch).  Will only cover Freestyle if patient has: insulin pump, visual impairment, or physical/mental disability that prevent him from using one of the preferred. Please advise. Pt uses [facility] - [address]

## 2012-05-25 MED ORDER — GLUCOSE BLOOD VI STRP: 1.0000 | ORAL_STRIP | Freq: Three times a day (TID) | Status: DC | PRN

## 2012-05-25 MED ORDER — BAYER MICROLET LANCETS MISC: 1.0000 | Freq: Three times a day (TID) | Status: DC | PRN

## 2012-05-25 NOTE — Telephone Encounter (Signed)
Left detailed message for pt that need to change glucose meter and test strips due to insurance, have meter here for you. Please call office.

## 2012-05-25 NOTE — Telephone Encounter (Signed)
Pt stopped by office and went with Bayer Contour Next Meter. New Rx's given to pt to take to pharmacy.

## 2012-06-19 ENCOUNTER — Ambulatory Visit (INDEPENDENT_AMBULATORY_CARE_PROVIDER_SITE_OTHER): Payer: BC Managed Care – PPO | Admitting: Internal Medicine

## 2012-06-19 ENCOUNTER — Encounter: Payer: Self-pay | Admitting: Internal Medicine

## 2012-06-19 VITALS — BP 120/88 | HR 100 | Temp 98.6°F | Wt 263.0 lb

## 2012-06-19 DIAGNOSIS — E119 Type 2 diabetes mellitus without complications: Secondary | ICD-10-CM

## 2012-06-19 DIAGNOSIS — I10 Essential (primary) hypertension: Secondary | ICD-10-CM

## 2012-06-19 LAB — HEMOGLOBIN A1C
Hgb A1c MFr Bld: 7.1 % — ABNORMAL HIGH (ref <5.7)
Mean Plasma Glucose: 157 mg/dL — ABNORMAL HIGH (ref <117)

## 2012-06-19 MED ORDER — METFORMIN HCL ER 500 MG PO TB24: ORAL_TABLET | ORAL | Status: DC

## 2012-06-19 NOTE — Progress Notes (Signed)
Subjective:    Patient ID: Gene Weeks, male    DOB: 04/17/1975, 37 y.o.   MRN: [id number]  HPI  37 year old patient who is seen today for followup of diabetes. Has a history of treated hypertension. He has noted the greatly improved glycemic control since the addition ofVictoza. Presently he is at a dose of 1.2 mg daily. At the present time he is also up titrated metformin to 1500 mg tablets daily. He has had difficulty in tolerating this medication in the past do to GI symptoms. He is also on glipizide 10 mg daily. No symptomatic hypoglycemia but he has noted a single blood sugar in the 60s. He does exercise regularly almost daily  Past Medical History  Diagnosis Date  . DIABETES MELLITUS, TYPE II 04/08/2008  . HYPERTENSION 04/08/2008  . OSTEOARTHRITIS 04/08/2008    History   Social History  . Marital Status: Single    Spouse Name: N/A    Number of Children: N/A  . Years of Education: N/A   Occupational History  . Not on file.   Social History Main Topics  . Smoking status: Never Smoker   . Smokeless tobacco: Never Used  . Alcohol Use: No  . Drug Use: No  . Sexually Active: Not on file   Other Topics Concern  . Not on file   Social History Narrative  . No narrative on file    Past Surgical History  Procedure Laterality Date  . Knee arthroscopy      left    Family History  Problem Relation Age of Onset  . Cancer Mother     colon ca  . Hypothyroidism Mother   . Hyperlipidemia Father     Allergies  Allergen Reactions  . Amoxicillin     Current Outpatient Prescriptions on File Prior to Visit  Medication Sig Dispense Refill  . BAYER MICROLET LANCETS lancets 1 each by Other route 3 (three) times daily as needed for other. Use as instructed  100 each  12  . glipiZIDE (GLUCOTROL XL) 10 MG 24 hr tablet Take 1 tablet (10 mg total) by mouth daily.  90 tablet  2  . glucose blood (BAYER CONTOUR NEXT TEST) test strip 1 each by Other route 3 (three) times daily as  needed for other. Use as instructed  100 each  12  . Insulin Pen Needle (NOVOFINE) 32G X 6 MM MISC Test once daily PRN  100 each  6  . Liraglutide (VICTOZA) 18 MG/3ML SOLN injection Inject 1.2 mg into the skin daily.       . sildenafil (VIAGRA) 100 MG tablet Take 0.5-1 tablets (50-100 mg total) by mouth daily as needed for erectile dysfunction.  3 tablet  11  . traMADol (ULTRAM) 50 MG tablet Take 1 tablet (50 mg total) by mouth every 8 (eight) hours as needed for pain.  90 tablet  2   No current facility-administered medications on file prior to visit.    BP 120/88  Pulse 100  Temp(Src) 98.6 F (37 C) (Oral)  Wt 263 lb (119.296 kg)  BMI 31.8 kg/m2       Review of Systems  Constitutional: Negative for fever, chills, appetite change and fatigue.  HENT: Negative for hearing loss, ear pain, congestion, sore throat, trouble swallowing, neck stiffness, dental problem, voice change and tinnitus.   Eyes: Negative for pain, discharge and visual disturbance.  Respiratory: Negative for cough, chest tightness, wheezing and stridor.   Cardiovascular: Negative for chest pain, palpitations and leg  swelling.  Gastrointestinal: Negative for nausea, vomiting, abdominal pain, diarrhea, constipation, blood in stool and abdominal distention.  Genitourinary: Negative for urgency, hematuria, flank pain, discharge, difficulty urinating and genital sores.  Musculoskeletal: Negative for myalgias, back pain, joint swelling, arthralgias and gait problem.  Skin: Negative for rash.  Neurological: Negative for dizziness, syncope, speech difficulty, weakness, numbness and headaches.  Hematological: Negative for adenopathy. Does not bruise/bleed easily.  Psychiatric/Behavioral: Negative for behavioral problems and dysphoric mood. The patient is not nervous/anxious.        Objective:   Physical Exam  Constitutional: He appears well-developed and well-nourished. No distress.  Blood pressure 120/84  Weight 263           Assessment & Plan:   Diabetes mellitus. Will attempt to increase metformin to 2 g daily in divided dosages. If unable to tolerate well down titrate back to 1500 mg daily. Will check a hemoglobin A1c. Samples provided Hypertension blood pressure controlled off medications  Recheck 3 months

## 2012-06-19 NOTE — Patient Instructions (Signed)
Limit your sodium (Salt) intake    It is important that you exercise regularly, at least 20 minutes 3 to 4 times per week.  If you develop chest pain or shortness of breath seek  medical attention.   Please check your hemoglobin A1c every 3 months   

## 2012-09-02 ENCOUNTER — Other Ambulatory Visit: Payer: Self-pay | Admitting: Internal Medicine

## 2012-09-11 ENCOUNTER — Ambulatory Visit: Payer: BC Managed Care – PPO | Admitting: Internal Medicine

## 2012-12-28 ENCOUNTER — Other Ambulatory Visit: Payer: Self-pay | Admitting: Internal Medicine

## 2013-02-15 ENCOUNTER — Other Ambulatory Visit: Payer: Self-pay | Admitting: Internal Medicine

## 2013-02-15 NOTE — Telephone Encounter (Signed)
Pt has had a death in the family and must catch a flight at 12:45. Pt request a refill of Liraglutide (VICTOZA) 18 MG/3ML SOLN injection 1/bow w/ 3 in each [facility]/ [address]

## 2013-02-23 ENCOUNTER — Telehealth: Payer: Self-pay | Admitting: Internal Medicine

## 2013-02-23 NOTE — Telephone Encounter (Signed)
Please add on as  final appointment of the day tomorrow

## 2013-02-23 NOTE — Telephone Encounter (Signed)
lmom for pt to arrive at 12pm

## 2013-02-23 NOTE — Telephone Encounter (Signed)
Pt was dc'd from hosp on sat. Pt had a stroke. Pt is going out of town on thurs and asked to see dr Kirtland Bouchard tomorrow.  The hosp changed some of his meds and added some meds.  pt not comfortable w/ these decisions and says he must see dr Kirtland Bouchard tomorrow! pls advise.

## 2013-02-23 NOTE — Telephone Encounter (Signed)
Dr. Kirtland Bouchard, please advise there are no appointments.

## 2013-02-24 ENCOUNTER — Encounter: Payer: Self-pay | Admitting: Internal Medicine

## 2013-02-24 ENCOUNTER — Ambulatory Visit (INDEPENDENT_AMBULATORY_CARE_PROVIDER_SITE_OTHER): Payer: BC Managed Care – PPO | Admitting: Internal Medicine

## 2013-02-24 VITALS — BP 130/90 | HR 88 | Temp 98.0°F | Resp 20 | Wt 259.0 lb

## 2013-02-24 DIAGNOSIS — I6789 Other cerebrovascular disease: Secondary | ICD-10-CM

## 2013-02-24 DIAGNOSIS — IMO0002 Reserved for concepts with insufficient information to code with codable children: Secondary | ICD-10-CM | POA: Insufficient documentation

## 2013-02-24 DIAGNOSIS — I10 Essential (primary) hypertension: Secondary | ICD-10-CM

## 2013-02-24 DIAGNOSIS — E119 Type 2 diabetes mellitus without complications: Secondary | ICD-10-CM

## 2013-02-24 HISTORY — DX: Reserved for concepts with insufficient information to code with codable children: IMO0002

## 2013-02-24 MED ORDER — ATORVASTATIN CALCIUM 20 MG PO TABS: 20.0000 mg | ORAL_TABLET | Freq: Every day | ORAL | Status: DC

## 2013-02-24 MED ORDER — AMLODIPINE BESY-BENAZEPRIL HCL 5-20 MG PO CAPS: 1.0000 | ORAL_CAPSULE | Freq: Every day | ORAL | Status: DC

## 2013-02-24 MED ORDER — ASPIRIN EC 81 MG PO TBEC: 81.0000 mg | DELAYED_RELEASE_TABLET | Freq: Every day | ORAL | Status: DC

## 2013-02-24 NOTE — Progress Notes (Signed)
Pre-visit discussion using our clinic review tool. No additional management support is needed unless otherwise documented below in the visit note.  

## 2013-02-24 NOTE — Patient Instructions (Addendum)
Limit your sodium (Salt) intake    It is important that you exercise regularly, at least 20 minutes 3 to 4 times per week.  If you develop chest pain or shortness of breath seek  medical attention.   Please check your hemoglobin A1c every 3 months  Please check your blood pressure on a regular basis.  If it is consistently greater than 150/90, please make an office appointment.  

## 2013-02-24 NOTE — Progress Notes (Signed)
Subjective:    Patient ID: Gene Weeks, male    DOB: 1975-12-31, 37 y.o.   MRN: [id number]  HPI  37 year old patient who has a history of hypertension and diabetes who was hospitalized at [facility] on Christmas Eve. He presented with right-sided numbness and weakness and was treated with TPA. All symptoms resolved 20 minutes following infusion with total duration of symptoms approximately 60 minutes. Since his discharge he has done well. Postprocedure MRI was negative for infarction. Neurovascularly she included echocardiogram and carotid artery Doppler studies that were normal  Hospital records reviewed  Past Medical History  Diagnosis Date  . DIABETES MELLITUS, TYPE II 04/08/2008  . HYPERTENSION 04/08/2008  . OSTEOARTHRITIS 04/08/2008    History   Social History  . Marital Status: Single    Spouse Name: N/A    Number of Children: N/A  . Years of Education: N/A   Occupational History  . Not on file.   Social History Main Topics  . Smoking status: Never Smoker   . Smokeless tobacco: Never Used  . Alcohol Use: No  . Drug Use: No  . Sexual Activity: Not on file   Other Topics Concern  . Not on file   Social History Narrative  . No narrative on file    Past Surgical History  Procedure Laterality Date  . Knee arthroscopy      left    Family History  Problem Relation Age of Onset  . Cancer Mother     colon ca  . Hypothyroidism Mother   . Hyperlipidemia Father     Allergies  Allergen Reactions  . Amoxicillin     Current Outpatient Prescriptions on File Prior to Visit  Medication Sig Dispense Refill  . BAYER MICROLET LANCETS lancets 1 each by Other route 3 (three) times daily as needed for other. Use as instructed  100 each  12  . glipiZIDE (GLUCOTROL XL) 10 MG 24 hr tablet TAKE 1 TABLET BY MOUTH DAILY  90 tablet  1  . glucose blood (BAYER CONTOUR NEXT TEST) test strip 1 each by Other route 3 (three) times daily as needed for other. Use as  instructed  100 each  12  . Insulin Pen Needle (NOVOFINE) 32G X 6 MM MISC Test once daily PRN  100 each  6  . Liraglutide (VICTOZA) 18 MG/3ML SOLN injection Inject 1.2 mg into the skin daily.       . metFORMIN (GLUCOPHAGE-XR) 500 MG 24 hr tablet 2 tablets twice daily  180 tablet  6  . sildenafil (VIAGRA) 100 MG tablet Take 0.5-1 tablets (50-100 mg total) by mouth daily as needed for erectile dysfunction.  3 tablet  11  . traMADol (ULTRAM) 50 MG tablet Take 1 tablet (50 mg total) by mouth every 8 (eight) hours as needed for pain.  90 tablet  2  . VICTOZA 18 MG/3ML SOPN INJECT 0.6 MG(0.1 ML) INTO THE SKIN DAILY  9 mL  3   No current facility-administered medications on file prior to visit.    BP 130/90  Pulse 88  Temp(Src) 98 F (36.7 C) (Oral)  Resp 20  Wt 259 lb (117.482 kg)  SpO2 98%       Review of Systems  Constitutional: Negative for fever, chills, appetite change and fatigue.  HENT: Negative for congestion, dental problem, ear pain, hearing loss, sore throat, tinnitus, trouble swallowing and voice change.   Eyes: Negative for pain, discharge and visual disturbance.  Respiratory: Negative for  cough, chest tightness, wheezing and stridor.   Cardiovascular: Negative for chest pain, palpitations and leg swelling.  Gastrointestinal: Negative for nausea, vomiting, abdominal pain, diarrhea, constipation, blood in stool and abdominal distention.  Genitourinary: Negative for urgency, hematuria, flank pain, discharge, difficulty urinating and genital sores.  Musculoskeletal: Negative for arthralgias, back pain, gait problem, joint swelling, myalgias and neck stiffness.  Skin: Negative for rash.  Neurological: Positive for weakness and numbness. Negative for dizziness, syncope, speech difficulty and headaches.  Hematological: Negative for adenopathy. Does not bruise/bleed easily.  Psychiatric/Behavioral: Negative for behavioral problems and dysphoric mood. The patient is not  nervous/anxious.        Objective:   Physical Exam  Constitutional: He is oriented to person, place, and time. He appears well-developed.  Blood pressure 130/90  HENT:  Head: Normocephalic.  Right Ear: External ear normal.  Left Ear: External ear normal.  Eyes: Conjunctivae and EOM are normal.  Neck: Normal range of motion.  Cardiovascular: Normal rate and normal heart sounds.   Pulmonary/Chest: Breath sounds normal.  Abdominal: Bowel sounds are normal.  Musculoskeletal: Normal range of motion. He exhibits no edema and no tenderness.  Neurological: He is alert and oriented to person, place, and time. No cranial nerve deficit. Coordination normal.  Psychiatric: He has a normal mood and affect. His behavior is normal.          Assessment & Plan:   Status post acute left brain ischemic stroke. Status post TPA with full neurological recovery. We'll continue aggressive risk factor modification. We'll place on statin therapy today. Diabetic control and blood pressure control emphasized Diabetes mellitus. Last hemoglobin A1c 7.1 but this was in the spring. We'll check today and repeat in 3 months Hypertension. Blood pressure 130/90 without medications today. Home blood pressure monitor and encouraged we'll recheck in 3 months

## 2013-02-25 LAB — HEMOGLOBIN A1C
Hgb A1c MFr Bld: 9.6 % — ABNORMAL HIGH (ref <5.7)
Mean Plasma Glucose: 229 mg/dL — ABNORMAL HIGH (ref <117)

## 2013-03-01 ENCOUNTER — Other Ambulatory Visit: Payer: Self-pay | Admitting: *Deleted

## 2013-07-02 ENCOUNTER — Telehealth: Payer: Self-pay | Admitting: Internal Medicine

## 2013-07-02 MED ORDER — LIRAGLUTIDE 18 MG/3ML ~~LOC~~ SOPN: 1.2000 mg | PEN_INJECTOR | Freq: Every day | SUBCUTANEOUS | Status: DC

## 2013-07-02 NOTE — Telephone Encounter (Signed)
Pt states he is out of VICTOZA 18 MG/3ML SOPN Pt states he cannot get refill until 5/28.  Pt states dr Kirtland Bouchard took him up to 1.2 from .6 and he has run out. Pt is at work today, sugar is high and he is feeling sluggish.  Per donna, ok to pu sample.  Pt states for future reference, his rx may need to be changed so he doesn't run out again. pls advise [facility]

## 2013-07-02 NOTE — Telephone Encounter (Signed)
Left detailed message new Rx for Victoza was sent to pharmacy.

## 2013-09-03 ENCOUNTER — Telehealth: Payer: Self-pay | Admitting: Internal Medicine

## 2013-09-03 NOTE — Telephone Encounter (Signed)
lmom for pt to cb. Pt needs diabetic bundle a1c and ldl. Pt also needs follow up on diabetes

## 2013-09-08 NOTE — Telephone Encounter (Signed)
appt scheduled for pt.  

## 2013-10-11 ENCOUNTER — Other Ambulatory Visit: Payer: Self-pay | Admitting: Internal Medicine

## 2013-10-26 ENCOUNTER — Other Ambulatory Visit (INDEPENDENT_AMBULATORY_CARE_PROVIDER_SITE_OTHER): Payer: BC Managed Care – PPO

## 2013-10-26 ENCOUNTER — Other Ambulatory Visit: Payer: Self-pay | Admitting: *Deleted

## 2013-10-26 DIAGNOSIS — E785 Hyperlipidemia, unspecified: Secondary | ICD-10-CM

## 2013-10-26 DIAGNOSIS — IMO0001 Reserved for inherently not codable concepts without codable children: Secondary | ICD-10-CM

## 2013-10-26 DIAGNOSIS — E1165 Type 2 diabetes mellitus with hyperglycemia: Principal | ICD-10-CM

## 2013-10-26 LAB — LIPID PANEL
CHOLESTEROL: 154 mg/dL (ref 0–200)
HDL: 55 mg/dL (ref >39.00)
LDL CALC: 82 mg/dL (ref 0–99)
NonHDL: 99
TRIGLYCERIDES: 87 mg/dL (ref 0.0–149.0)
Total CHOL/HDL Ratio: 3
VLDL: 17.4 mg/dL (ref 0.0–40.0)

## 2013-10-26 LAB — HEMOGLOBIN A1C: HEMOGLOBIN A1C: 11 % — AB (ref 4.6–6.5)

## 2013-10-26 LAB — BASIC METABOLIC PANEL
BUN: 12 mg/dL (ref 6–23)
CHLORIDE: 98 meq/L (ref 96–112)
CO2: 28 mEq/L (ref 19–32)
Calcium: 9.1 mg/dL (ref 8.4–10.5)
Creatinine, Ser: 0.9 mg/dL (ref 0.4–1.5)
GFR: 124.41 mL/min (ref >60.00)
Glucose, Bld: 283 mg/dL — ABNORMAL HIGH (ref 70–99)
POTASSIUM: 3.9 meq/L (ref 3.5–5.1)
SODIUM: 134 meq/L — AB (ref 135–145)

## 2013-10-26 LAB — MICROALBUMIN / CREATININE URINE RATIO
CREATININE, U: 112.2 mg/dL
MICROALB UR: 1.5 mg/dL (ref 0.0–1.9)
MICROALB/CREAT RATIO: 1.3 mg/g (ref 0.0–30.0)

## 2013-10-26 MED ORDER — LIRAGLUTIDE 18 MG/3ML ~~LOC~~ SOPN: 1.2000 mg | PEN_INJECTOR | Freq: Every day | SUBCUTANEOUS | Status: DC

## 2013-11-02 ENCOUNTER — Ambulatory Visit (INDEPENDENT_AMBULATORY_CARE_PROVIDER_SITE_OTHER): Payer: BC Managed Care – PPO | Admitting: Internal Medicine

## 2013-11-02 ENCOUNTER — Encounter: Payer: Self-pay | Admitting: Internal Medicine

## 2013-11-02 VITALS — BP 110/78 | HR 100 | Temp 98.0°F | Resp 20 | Ht 76.25 in | Wt 264.0 lb

## 2013-11-02 DIAGNOSIS — Z23 Encounter for immunization: Secondary | ICD-10-CM

## 2013-11-02 DIAGNOSIS — I10 Essential (primary) hypertension: Secondary | ICD-10-CM

## 2013-11-02 DIAGNOSIS — E119 Type 2 diabetes mellitus without complications: Secondary | ICD-10-CM

## 2013-11-02 MED ORDER — LIRAGLUTIDE 18 MG/3ML ~~LOC~~ SOPN: 1.2000 mg | PEN_INJECTOR | Freq: Every day | SUBCUTANEOUS | Status: DC

## 2013-11-02 NOTE — Progress Notes (Signed)
Pre visit review using our clinic review tool, if applicable. No additional management support is needed unless otherwise documented below in the visit note. 

## 2013-11-02 NOTE — Progress Notes (Signed)
Subjective:    Patient ID: Gene Weeks, male    DOB: Dec 09, 1975, 38 y.o.   MRN: [id number]  HPI  38 year old patient who has not been seen in several months.  He has hypertension, type 2 diabetes.  He has been out of all his medications through most of the summer.  Recent hemoglobin A1c was 11.  He generally feels well. No focal neurological symptoms.  He has 2 hypertension, which has been controlled.  Remains on atorvastatin. No cardiopulmonary complaints  Past Medical History  Diagnosis Date  . DIABETES MELLITUS, TYPE II 04/08/2008  . HYPERTENSION 04/08/2008  . OSTEOARTHRITIS 04/08/2008    History   Social History  . Marital Status: Single    Spouse Name: N/A    Number of Children: N/A  . Years of Education: N/A   Occupational History  . Not on file.   Social History Main Topics  . Smoking status: Never Smoker   . Smokeless tobacco: Never Used  . Alcohol Use: No  . Drug Use: No  . Sexual Activity: Not on file   Other Topics Concern  . Not on file   Social History Narrative  . No narrative on file    Past Surgical History  Procedure Laterality Date  . Knee arthroscopy      left    Family History  Problem Relation Age of Onset  . Cancer Mother     colon ca  . Hypothyroidism Mother   . Hyperlipidemia Father     Allergies  Allergen Reactions  . Amoxicillin     Current Outpatient Prescriptions on File Prior to Visit  Medication Sig Dispense Refill  . aspirin EC 81 MG tablet Take 1 tablet (81 mg total) by mouth daily.      Marland Kitchen atorvastatin (LIPITOR) 20 MG tablet Take 1 tablet (20 mg total) by mouth daily.  90 tablet  3  . BAYER MICROLET LANCETS lancets 1 each by Other route 3 (three) times daily as needed for other. Use as instructed  100 each  12  . glipiZIDE (GLUCOTROL XL) 10 MG 24 hr tablet TAKE 1 TABLET BY MOUTH DAILY  90 tablet  1  . glucose blood (BAYER CONTOUR NEXT TEST) test strip 1 each by Other route 3 (three) times daily as needed for other.  Use as instructed  100 each  12  . Liraglutide (VICTOZA) 18 MG/3ML SOPN Inject 1.2 mg into the skin daily.  1 pen  0  . lisinopril (PRINIVIL,ZESTRIL) 20 MG tablet Take 20 mg by mouth daily.       . metFORMIN (GLUCOPHAGE-XR) 500 MG 24 hr tablet 2 tablets twice daily  180 tablet  6  . NOVOFINE 32G X 6 MM MISC TEST ONCE DAILY AS NEEDED  100 each  0  . sildenafil (VIAGRA) 100 MG tablet Take 0.5-1 tablets (50-100 mg total) by mouth daily as needed for erectile dysfunction.  3 tablet  11  . traMADol (ULTRAM) 50 MG tablet Take 1 tablet (50 mg total) by mouth every 8 (eight) hours as needed for pain.  90 tablet  2   No current facility-administered medications on file prior to visit.    BP 110/78  Pulse 100  Temp(Src) 98 F (36.7 C) (Oral)  Resp 20  Ht 6' 4.25" (1.937 m)  Wt 264 lb (119.75 kg)  BMI 31.92 kg/m2  SpO2 98%     Review of Systems  Constitutional: Negative for fever, chills, appetite change and fatigue.  HENT: Negative  for congestion, dental problem, ear pain, hearing loss, sore throat, tinnitus, trouble swallowing and voice change.   Eyes: Negative for pain, discharge and visual disturbance.  Respiratory: Negative for cough, chest tightness, wheezing and stridor.   Cardiovascular: Negative for chest pain, palpitations and leg swelling.  Gastrointestinal: Negative for nausea, vomiting, abdominal pain, diarrhea, constipation, blood in stool and abdominal distention.  Genitourinary: Negative for urgency, hematuria, flank pain, discharge, difficulty urinating and genital sores.  Musculoskeletal: Negative for arthralgias, back pain, gait problem, joint swelling, myalgias and neck stiffness.  Skin: Negative for rash.  Neurological: Negative for dizziness, syncope, speech difficulty, weakness, numbness and headaches.  Hematological: Negative for adenopathy. Does not bruise/bleed easily.  Psychiatric/Behavioral: Negative for behavioral problems and dysphoric mood. The patient is not  nervous/anxious.        Objective:   Physical Exam  Constitutional: He is oriented to person, place, and time. He appears well-developed.  HENT:  Head: Normocephalic.  Right Ear: External ear normal.  Left Ear: External ear normal.  Eyes: Conjunctivae and EOM are normal.  Neck: Normal range of motion.  Cardiovascular: Normal rate, normal heart sounds and intact distal pulses.   Pulmonary/Chest: Breath sounds normal.  Abdominal: Bowel sounds are normal.  Musculoskeletal: Normal range of motion. He exhibits no edema and no tenderness.  Neurological: He is alert and oriented to person, place, and time.  Psychiatric: He has a normal mood and affect. His behavior is normal.          Assessment & Plan:   Diabetes mellitus.  Compliance with his medications discussed.  All medications refilled.  We'll recheck in 3 months and check a hemoglobin A1c at that time Hypertension stable  Dyslipidemia.  Continue statin therapy History of stroke syndrome.  No focal neurological complaints  Recheck 3 months

## 2013-11-02 NOTE — Patient Instructions (Signed)
Limit your sodium (Salt) intake    It is important that you exercise regularly, at least 20 minutes 3 to 4 times per week.  If you develop chest pain or shortness of breath seek  medical attention.  You need to lose weight.  Consider a lower calorie diet and regular exercise. 

## 2013-12-20 ENCOUNTER — Other Ambulatory Visit: Payer: Self-pay | Admitting: Internal Medicine

## 2013-12-29 ENCOUNTER — Encounter: Payer: Self-pay | Admitting: Internal Medicine

## 2013-12-29 ENCOUNTER — Ambulatory Visit (INDEPENDENT_AMBULATORY_CARE_PROVIDER_SITE_OTHER): Payer: BC Managed Care – PPO | Admitting: Internal Medicine

## 2013-12-29 DIAGNOSIS — I10 Essential (primary) hypertension: Secondary | ICD-10-CM

## 2013-12-29 DIAGNOSIS — E119 Type 2 diabetes mellitus without complications: Secondary | ICD-10-CM

## 2013-12-29 DIAGNOSIS — M5442 Lumbago with sciatica, left side: Secondary | ICD-10-CM

## 2013-12-29 MED ORDER — METHYLPREDNISOLONE ACETATE 80 MG/ML IJ SUSP
40.0000 mg | Freq: Once | INTRAMUSCULAR | Status: AC
  Administered 2013-12-29: 40 mg via INTRAMUSCULAR

## 2013-12-29 MED ORDER — TRAMADOL HCL 50 MG PO TABS: 50.0000 mg | ORAL_TABLET | Freq: Three times a day (TID) | ORAL | Status: DC | PRN

## 2013-12-29 NOTE — Progress Notes (Signed)
Pre visit review using our clinic review tool, if applicable. No additional management support is needed unless otherwise documented below in the visit note. 

## 2013-12-29 NOTE — Progress Notes (Signed)
Subjective:    Patient ID: Gene Weeks, male    DOB: 1975/09/15, 38 y.o.   MRN: [id number]  HPI  38 year old patient who has had a history of episodic low back pain.  4 days ago the patient began noting tightness in the left lumbar area.  This progressed over 24 hours and now has had significant left lumbar pain with radiation to the left thigh region.  Pain is aggravated by movement and alleviated by rest. No motor weakness. He has a history of type 2 diabetes.  No medications today.  Last hemoglobin A1c poorly controlled  Past Medical History  Diagnosis Date  . DIABETES MELLITUS, TYPE II 04/08/2008  . HYPERTENSION 04/08/2008  . OSTEOARTHRITIS 04/08/2008    History   Social History  . Marital Status: Single    Spouse Name: N/A    Number of Children: N/A  . Years of Education: N/A   Occupational History  . Not on file.   Social History Main Topics  . Smoking status: Never Smoker   . Smokeless tobacco: Never Used  . Alcohol Use: No  . Drug Use: No  . Sexual Activity: Not on file   Other Topics Concern  . Not on file   Social History Narrative    Past Surgical History  Procedure Laterality Date  . Knee arthroscopy      left    Family History  Problem Relation Age of Onset  . Cancer Mother     colon ca  . Hypothyroidism Mother   . Hyperlipidemia Father     Allergies  Allergen Reactions  . Amoxicillin     Current Outpatient Prescriptions on File Prior to Visit  Medication Sig Dispense Refill  . aspirin EC 81 MG tablet Take 1 tablet (81 mg total) by mouth daily.    Marland Kitchen atorvastatin (LIPITOR) 20 MG tablet Take 1 tablet (20 mg total) by mouth daily. 90 tablet 3  . BAYER MICROLET LANCETS lancets 1 each by Other route 3 (three) times daily as needed for other. Use as instructed 100 each 12  . glipiZIDE (GLUCOTROL XL) 10 MG 24 hr tablet TAKE 1 TABLET BY MOUTH DAILY 90 tablet 1  . glucose blood (BAYER CONTOUR NEXT TEST) test strip 1 each by Other route 3 (three)  times daily as needed for other. Use as instructed 100 each 12  . Liraglutide (VICTOZA) 18 MG/3ML SOPN Inject 1.2 mg into the skin daily. 1 pen 0  . lisinopril (PRINIVIL,ZESTRIL) 20 MG tablet TAKE 1 TABLET BY MOUTH DAILY 90 tablet 1  . metFORMIN (GLUCOPHAGE-XR) 500 MG 24 hr tablet 2 tablets twice daily 180 tablet 6  . NOVOFINE 32G X 6 MM MISC TEST ONCE DAILY AS NEEDED 100 each 0  . sildenafil (VIAGRA) 100 MG tablet Take 0.5-1 tablets (50-100 mg total) by mouth daily as needed for erectile dysfunction. 3 tablet 11   No current facility-administered medications on file prior to visit.    BP 130/90 mmHg  Pulse 98  Temp(Src) 98.2 F (36.8 C) (Oral)  Resp 20  Ht 6' 4.25" (1.937 m)  Wt 263 lb (119.296 kg)  BMI 31.80 kg/m2  SpO2 98%     Review of Systems  Constitutional: Negative for fever, chills, appetite change and fatigue.  HENT: Negative for congestion, dental problem, ear pain, hearing loss, sore throat, tinnitus, trouble swallowing and voice change.   Eyes: Negative for pain, discharge and visual disturbance.  Respiratory: Negative for cough, chest tightness, wheezing and stridor.  Cardiovascular: Negative for chest pain, palpitations and leg swelling.  Gastrointestinal: Negative for nausea, vomiting, abdominal pain, diarrhea, constipation, blood in stool and abdominal distention.  Genitourinary: Negative for urgency, hematuria, flank pain, discharge, difficulty urinating and genital sores.  Musculoskeletal: Positive for back pain. Negative for myalgias, joint swelling, arthralgias, gait problem and neck stiffness.  Skin: Negative for rash.  Neurological: Negative for dizziness, syncope, speech difficulty, weakness, numbness and headaches.  Hematological: Negative for adenopathy. Does not bruise/bleed easily.  Psychiatric/Behavioral: Negative for behavioral problems and dysphoric mood. The patient is not nervous/anxious.        Objective:   Physical Exam  Constitutional: He  appears well-developed and well-nourished. No distress.  Musculoskeletal:  Bilateral straight leg test, more prominent on the left No motor weakness Reflexes intact          Assessment & Plan:   Acute low back pain with radicular symptoms.  Will treat with Depo-Medrol, rest, anti-inflammatories Note for excuse from work written  Diabetes mellitus.  Will recheck in 2 months as scheduled.  Compliance discussed and encouraged.  May need an endocrinology referral

## 2013-12-29 NOTE — Patient Instructions (Signed)
You  may move around, but avoid painful motions and activities.  Apply heat  to the sore area for 15 to 20 minutes 3 or 4 times daily for the next two to 3 days.Back Injury Prevention Back injuries can be extremely painful and difficult to heal. After having one back injury, you are much more likely to experience another later on. It is important to learn how to avoid injuring or re-injuring your back. The following tips can help you to prevent a back injury. PHYSICAL FITNESS  Exercise regularly and try to develop good tone in your abdominal muscles. Your abdominal muscles provide a lot of the support needed by your back.  Do aerobic exercises (walking, jogging, biking, swimming) regularly.  Do exercises that increase balance and strength (tai chi, yoga) regularly. This can decrease your risk of falling and injuring your back.  Stretch before and after exercising.  Maintain a healthy weight. The more you weigh, the more stress is placed on your back. For every pound of weight, 10 times that amount of pressure is placed on the back. DIET  Talk to your caregiver about how much calcium and vitamin D you need per day. These nutrients help to prevent weakening of the bones (osteoporosis). Osteoporosis can cause broken (fractured) bones that lead to back pain.  Include good sources of calcium in your diet, such as dairy products, green, leafy vegetables, and products with calcium added (fortified).  Include good sources of vitamin D in your diet, such as milk and foods that are fortified with vitamin D.  Consider taking a nutritional supplement or a multivitamin if needed.  Stop smoking if you smoke. POSTURE  Sit and stand up straight. Avoid leaning forward when you sit or hunching over when you stand.  Choose chairs with good low back (lumbar) support.  If you work at a desk, sit close to your work so you do not need to lean over. Keep your chin tucked in. Keep your neck drawn back and  elbows bent at a right angle. Your arms should look like the letter "L."  Sit high and close to the steering wheel when you drive. Add a lumbar support to your car seat if needed.  Avoid sitting or standing in one position for too long. Take breaks to get up, stretch, and walk around at least once every hour. Take breaks if you are driving for long periods of time.  Sleep on your side with your knees slightly bent, or sleep on your back with a pillow under your knees. Do not sleep on your stomach. LIFTING, TWISTING, AND REACHING  Avoid heavy lifting, especially repetitive lifting. If you must do heavy lifting:  Stretch before lifting.  Work slowly.  Rest between lifts.  Use carts and dollies to move objects when possible.  Make several small trips instead of carrying 1 heavy load.  Ask for help when you need it.  Ask for help when moving big, awkward objects.  Follow these steps when lifting:  Stand with your feet shoulder-width apart.  Get as close to the object as you can. Do not try to pick up heavy objects that are far from your body.  Use handles or lifting straps if they are available.  Bend at your knees. Squat down, but keep your heels off the floor.  Keep your shoulders pulled back, your chin tucked in, and your back straight.  Lift the object slowly, tightening the muscles in your legs, abdomen, and buttocks. Keep the  object as close to the center of your body as possible.  When you put a load down, use these same guidelines in reverse.  Do not:  Lift the object above your waist.  Twist at the waist while lifting or carrying a load. Move your feet if you need to turn, not your waist.  Bend over without bending at your knees.  Avoid reaching over your head, across a table, or for an object on a high surface. OTHER TIPS  Avoid wet floors and keep sidewalks clear of ice to prevent falls.  Do not sleep on a mattress that is too soft or too hard.  Keep  items that are used frequently within easy reach.  Put heavier objects on shelves at waist level and lighter objects on lower or higher shelves.  Find ways to decrease your stress, such as exercise, massage, or relaxation techniques. Stress can build up in your muscles. Tense muscles are more vulnerable to injury.  Seek treatment for depression or anxiety if needed. These conditions can increase your risk of developing back pain. SEEK MEDICAL CARE IF:  You injure your back.  You have questions about diet, exercise, or other ways to prevent back injuries. MAKE SURE YOU:  Understand these instructions.  Will watch your condition.  Will get help right away if you are not doing well or get worse. Document Released: 03/21/2004 Document Revised: 05/06/2011 Document Reviewed: 03/25/2011 [facility] Patient Information 2015 [place], [place]. This information is not intended to replace advice given to you by your health care provider. Make sure you discuss any questions you have with your health care provider. Lumbosacral Strain Lumbosacral strain is a strain of any of the parts that make up your lumbosacral vertebrae. Your lumbosacral vertebrae are the bones that make up the lower third of your backbone. Your lumbosacral vertebrae are held together by muscles and tough, fibrous tissue (ligaments).  CAUSES  A sudden blow to your back can cause lumbosacral strain. Also, anything that causes an excessive stretch of the muscles in the low back can cause this strain. This is typically seen when people exert themselves strenuously, fall, lift heavy objects, bend, or crouch repeatedly. RISK FACTORS  Physically demanding work.  Participation in pushing or pulling sports or sports that require a sudden twist of the back (tennis, golf, baseball).  Weight lifting.  Excessive lower back curvature.  Forward-tilted pelvis.  Weak back or abdominal muscles or both.  Tight hamstrings. SIGNS AND SYMPTOMS    Lumbosacral strain may cause pain in the area of your injury or pain that moves (radiates) down your leg.  DIAGNOSIS Your health care provider can often diagnose lumbosacral strain through a physical exam. In some cases, you may need tests such as X-ray exams.  TREATMENT  Treatment for your lower back injury depends on many factors that your clinician will have to evaluate. However, most treatment will include the use of anti-inflammatory medicines. HOME CARE INSTRUCTIONS   Avoid hard physical activities (tennis, racquetball, waterskiing) if you are not in proper physical condition for it. This may aggravate or create problems.  If you have a back problem, avoid sports requiring sudden body movements. Swimming and walking are generally safer activities.  Maintain good posture.  Maintain a healthy weight.  For acute conditions, you may put ice on the injured area.  Put ice in a plastic bag.  Place a towel between your skin and the bag.  Leave the ice on for 20 minutes, 2-3 times a day.  When the low back starts healing, stretching and strengthening exercises may be recommended. SEEK MEDICAL CARE IF:  Your back pain is getting worse.  You experience severe back pain not relieved with medicines. SEEK IMMEDIATE MEDICAL CARE IF:   You have numbness, tingling, weakness, or problems with the use of your arms or legs.  There is a change in bowel or bladder control.  You have increasing pain in any area of the body, including your belly (abdomen).  You notice shortness of breath, dizziness, or feel faint.  You feel sick to your stomach (nauseous), are throwing up (vomiting), or become sweaty.  You notice discoloration of your toes or legs, or your feet get very cold. MAKE SURE YOU:   Understand these instructions.  Will watch your condition.  Will get help right away if you are not doing well or get worse. Document Released: 11/21/2004 Document Revised: 02/16/2013 Document  Reviewed: 09/30/2012 [facility] Patient Information 2015 [place], [place]. This information is not intended to replace advice given to you by your health care provider. Make sure you discuss any questions you have with your health care provider.

## 2013-12-30 ENCOUNTER — Telehealth: Payer: Self-pay | Admitting: Internal Medicine

## 2013-12-30 NOTE — Telephone Encounter (Signed)
emmi emailed °

## 2014-01-25 ENCOUNTER — Ambulatory Visit: Payer: BC Managed Care – PPO | Admitting: Internal Medicine

## 2014-02-04 ENCOUNTER — Other Ambulatory Visit: Payer: Self-pay | Admitting: Internal Medicine

## 2014-03-14 ENCOUNTER — Other Ambulatory Visit: Payer: Self-pay | Admitting: Internal Medicine

## 2014-03-14 ENCOUNTER — Ambulatory Visit: Payer: BC Managed Care – PPO | Admitting: Internal Medicine

## 2014-06-24 ENCOUNTER — Other Ambulatory Visit: Payer: Self-pay | Admitting: Internal Medicine

## 2014-07-19 ENCOUNTER — Ambulatory Visit (INDEPENDENT_AMBULATORY_CARE_PROVIDER_SITE_OTHER): Payer: BC Managed Care – PPO | Admitting: Family Medicine

## 2014-07-19 ENCOUNTER — Encounter: Payer: Self-pay | Admitting: Family Medicine

## 2014-07-19 VITALS — BP 114/64 | HR 103 | Temp 99.4°F | Resp 14 | Wt 265.0 lb

## 2014-07-19 DIAGNOSIS — R6889 Other general symptoms and signs: Secondary | ICD-10-CM | POA: Diagnosis not present

## 2014-07-19 DIAGNOSIS — B349 Viral infection, unspecified: Secondary | ICD-10-CM | POA: Diagnosis not present

## 2014-07-19 LAB — POCT INFLUENZA A/B
INFLUENZA A, POC: NEGATIVE
INFLUENZA B, POC: NEGATIVE

## 2014-07-19 MED ORDER — HYDROCODONE-HOMATROPINE 5-1.5 MG/5ML PO SYRP: 5.0000 mL | ORAL_SOLUTION | Freq: Four times a day (QID) | ORAL | Status: AC | PRN

## 2014-07-19 NOTE — Patient Instructions (Signed)

## 2014-07-19 NOTE — Progress Notes (Signed)
   Subjective:    Patient ID: Gene Weeks, male    DOB: [birth year], 39 y.o.   MRN: [id number]  HPI Patient seen with onset 2 days ago of cough, body aches, and mild sore throat. He developed fever yesterday around 100.5. Body,aches have gotten even worse. He had occasional headaches. No nausea or vomiting. No diarrhea. No skin rashes.  Past Medical History  Diagnosis Date  . DIABETES MELLITUS, TYPE II 04/08/2008  . HYPERTENSION 04/08/2008  . OSTEOARTHRITIS 04/08/2008   Past Surgical History  Procedure Laterality Date  . Knee arthroscopy      left    reports that he has never smoked. He has never used smokeless tobacco. He reports that he does not drink alcohol or use illicit drugs. family history includes Cancer in his mother; Hyperlipidemia in his father; Hypothyroidism in his mother. Allergies  Allergen Reactions  . Amoxicillin       Review of Systems  Constitutional: Positive for fever, chills and fatigue.  HENT: Positive for congestion.   Respiratory: Positive for cough.   Musculoskeletal: Positive for myalgias.  Neurological: Positive for headaches.       Objective:   Physical Exam  Constitutional: He appears well-developed and well-nourished.  HENT:  Right Ear: External ear normal.  Left Ear: External ear normal.  Mouth/Throat: Oropharynx is clear and moist.  Neck: Neck supple.  Cardiovascular: Normal rate and regular rhythm.   Pulmonary/Chest: Effort normal and breath sounds normal. No respiratory distress. He has no wheezes. He has no rales.  Lymphadenopathy:    He has no cervical adenopathy.          Assessment & Plan:  Febrile illness. Suspect viral. Rule out influenza.   Flu screen negative.   Hycodan cough syrup as needed for cough.  symptomatic treatment.

## 2014-07-19 NOTE — Progress Notes (Signed)
Pre visit review using our clinic review tool, if applicable. No additional management support is needed unless otherwise documented below in the visit note. 

## 2014-08-23 ENCOUNTER — Other Ambulatory Visit: Payer: Self-pay | Admitting: Internal Medicine

## 2014-09-06 ENCOUNTER — Encounter: Payer: Self-pay | Admitting: Internal Medicine

## 2014-09-06 ENCOUNTER — Ambulatory Visit (INDEPENDENT_AMBULATORY_CARE_PROVIDER_SITE_OTHER): Payer: BC Managed Care – PPO | Admitting: Internal Medicine

## 2014-09-06 VITALS — BP 122/84 | HR 116 | Temp 97.0°F | Resp 20 | Ht 76.25 in | Wt 263.0 lb

## 2014-09-06 DIAGNOSIS — E1151 Type 2 diabetes mellitus with diabetic peripheral angiopathy without gangrene: Secondary | ICD-10-CM

## 2014-09-06 DIAGNOSIS — E1159 Type 2 diabetes mellitus with other circulatory complications: Secondary | ICD-10-CM | POA: Insufficient documentation

## 2014-09-06 DIAGNOSIS — I10 Essential (primary) hypertension: Secondary | ICD-10-CM | POA: Diagnosis not present

## 2014-09-06 DIAGNOSIS — E1165 Type 2 diabetes mellitus with hyperglycemia: Secondary | ICD-10-CM | POA: Insufficient documentation

## 2014-09-06 LAB — MICROALBUMIN / CREATININE URINE RATIO
CREATININE, U: 111 mg/dL
MICROALB UR: 0.7 mg/dL (ref 0.0–1.9)
MICROALB/CREAT RATIO: 0.6 mg/g (ref 0.0–30.0)

## 2014-09-06 LAB — HEMOGLOBIN A1C: Hgb A1c MFr Bld: 11.5 % — ABNORMAL HIGH (ref 4.6–6.5)

## 2014-09-06 NOTE — Progress Notes (Signed)
Subjective:    Patient ID: Gene Weeks, male    DOB: 12/14/1975, 39 y.o.   MRN: [id number]  HPI 39 year old patient who has a history of diabetes since his mid twenties.  He was seen today as a walk-in complaining of numbness and tingling of his feet.  He has not been seen in approximately 8 months and hemoglobin A1c was poorly controlled.  He states that he is having a difficult time due to the cost of his medications.  He has been out of Victoza for a few days.  He states he works 2 jobs and hasn't had time for his exercise program.  He has never been on insulin therapy He has a history of ED and uses Viagra. One of his jobs is at [employer] and he is on his feet walking most of the day. He describes his feet is being tight and also a burning sensation  Lab Results  Component Value Date   HGBA1C 11.0* 10/26/2013     Past Medical History  Diagnosis Date  . DIABETES MELLITUS, TYPE II 04/08/2008  . HYPERTENSION 04/08/2008  . OSTEOARTHRITIS 04/08/2008    History   Social History  . Marital Status: Single    Spouse Name: N/A  . Number of Children: N/A  . Years of Education: N/A   Occupational History  . Not on file.   Social History Main Topics  . Smoking status: Never Smoker   . Smokeless tobacco: Never Used  . Alcohol Use: No  . Drug Use: No  . Sexual Activity: Not on file   Other Topics Concern  . Not on file   Social History Narrative    Past Surgical History  Procedure Laterality Date  . Knee arthroscopy      left    Family History  Problem Relation Age of Onset  . Cancer Mother     colon ca  . Hypothyroidism Mother   . Hyperlipidemia Father     Allergies  Allergen Reactions  . Amoxicillin     Current Outpatient Prescriptions on File Prior to Visit  Medication Sig Dispense Refill  . aspirin EC 81 MG tablet Take 1 tablet (81 mg total) by mouth daily.    Marland Kitchen atorvastatin (LIPITOR) 20 MG tablet Take 1 tablet (20 mg total) by mouth daily. 90 tablet 3  .  BAYER MICROLET LANCETS lancets 1 each by Other route 3 (three) times daily as needed for other. Use as instructed 100 each 12  . glipiZIDE (GLUCOTROL XL) 10 MG 24 hr tablet TAKE 1 TABLET BY MOUTH DAILY 90 tablet 1  . glucose blood (BAYER CONTOUR NEXT TEST) test strip 1 each by Other route 3 (three) times daily as needed for other. Use as instructed 100 each 12  . ibuprofen (ADVIL,MOTRIN) 200 MG tablet Take 800 mg by mouth every 6 (six) hours as needed.    . Liraglutide (VICTOZA) 18 MG/3ML SOPN Inject 1.2 mg into the skin daily. 1 pen 0  . lisinopril (PRINIVIL,ZESTRIL) 20 MG tablet TAKE 1 TABLET BY MOUTH DAILY 90 tablet 1  . metFORMIN (GLUCOPHAGE-XR) 500 MG 24 hr tablet TAKE 2 TABLETS BY MOUTH TWICE DAILY 180 tablet 0  . NOVOFINE 32G X 6 MM MISC TEST ONCE DAILY AS NEEDED 100 each 0  . sildenafil (VIAGRA) 100 MG tablet Take 0.5-1 tablets (50-100 mg total) by mouth daily as needed for erectile dysfunction. 3 tablet 11  . traMADol (ULTRAM) 50 MG tablet Take 1 tablet (50 mg total) by  mouth every 8 (eight) hours as needed. 90 tablet 2  . VICTOZA 18 MG/3ML SOPN INJECT 1.2 MG UNDER THE SKIN DAILY 12 mL 1   No current facility-administered medications on file prior to visit.    BP 122/84 mmHg  Pulse 116  Temp(Src) 97 F (36.1 C) (Oral)  Resp 20  Ht 6' 4.25" (1.937 m)  Wt 263 lb (119.296 kg)  BMI 31.80 kg/m2  SpO2 97%      Review of Systems  Constitutional: Negative for fever, chills, appetite change and fatigue.  HENT: Negative for congestion, dental problem, ear pain, hearing loss, sore throat, tinnitus, trouble swallowing and voice change.   Eyes: Negative for pain, discharge and visual disturbance.  Respiratory: Negative for cough, chest tightness, wheezing and stridor.   Cardiovascular: Negative for chest pain, palpitations and leg swelling.  Gastrointestinal: Negative for nausea, vomiting, abdominal pain, diarrhea, constipation, blood in stool and abdominal distention.  Genitourinary:  Negative for urgency, hematuria, flank pain, discharge, difficulty urinating and genital sores.  Musculoskeletal: Negative for myalgias, back pain, joint swelling, arthralgias, gait problem and neck stiffness.  Skin: Negative for rash.  Neurological: Positive for numbness. Negative for dizziness, syncope, speech difficulty, weakness and headaches.  Hematological: Negative for adenopathy. Does not bruise/bleed easily.  Psychiatric/Behavioral: Negative for behavioral problems and dysphoric mood. The patient is not nervous/anxious.        Objective:   Physical Exam  Constitutional: He appears well-developed and well-nourished. No distress.  Blood pressure 120/82  Cardiovascular: Intact distal pulses.   Musculoskeletal:  Hammertoe deformities of the left second, third and fourth toes Callus formation over the lateral aspects of the feet  Increase hypesthesia involving the fifth toes bilaterally Full pedal pulses          Assessment & Plan:   Diabetes mellitus.  Poorly controlled.  Will check hemoglobin A1c.  Likely will refer to endocrinology for initiation of insulin therapy and further management.  Annual eye examination encouraged.  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

## 2014-09-06 NOTE — Progress Notes (Signed)
Pre visit review using our clinic review tool, if applicable. No additional management support is needed unless otherwise documented below in the visit note. 

## 2014-09-06 NOTE — Patient Instructions (Addendum)
Please check your hemoglobin A1c every 3 months  Diabetes and Foot Care Diabetes may cause you to have problems because of poor blood supply (circulation) to your feet and legs. This may cause the skin on your feet to become thinner, break easier, and heal more slowly. Your skin may become dry, and the skin may peel and crack. You may also have nerve damage in your legs and feet causing decreased feeling in them. You may not notice minor injuries to your feet that could lead to infections or more serious problems. Taking care of your feet is one of the most important things you can do for yourself.  HOME CARE INSTRUCTIONS  Wear shoes at all times, even in the house. Do not go barefoot. Bare feet are easily injured.  Check your feet daily for blisters, cuts, and redness. If you cannot see the bottom of your feet, use a mirror or ask someone for help.  Wash your feet with warm water (do not use hot water) and mild soap. Then pat your feet and the areas between your toes until they are completely dry. Do not soak your feet as this can dry your skin.  Apply a moisturizing lotion or petroleum jelly (that does not contain alcohol and is unscented) to the skin on your feet and to dry, brittle toenails. Do not apply lotion between your toes.  Trim your toenails straight across. Do not dig under them or around the cuticle. File the edges of your nails with an emery board or nail file.  Do not cut corns or calluses or try to remove them with medicine.  Wear clean socks or stockings every day. Make sure they are not too tight. Do not wear knee-high stockings since they may decrease blood flow to your legs.  Wear shoes that fit properly and have enough cushioning. To break in new shoes, wear them for just a few hours a day. This prevents you from injuring your feet. Always look in your shoes before you put them on to be sure there are no objects inside.  Do not cross your legs. This may decrease the blood  flow to your feet.  If you find a minor scrape, cut, or break in the skin on your feet, keep it and the skin around it clean and dry. These areas may be cleansed with mild soap and water. Do not cleanse the area with peroxide, alcohol, or iodine.  When you remove an adhesive bandage, be sure not to damage the skin around it.  If you have a wound, look at it several times a day to make sure it is healing.  Do not use heating pads or hot water bottles. They may burn your skin. If you have lost feeling in your feet or legs, you may not know it is happening until it is too late.  Make sure your health care provider performs a complete foot exam at least annually or more often if you have foot problems. Report any cuts, sores, or bruises to your health care provider immediately. SEEK MEDICAL CARE IF:   You have an injury that is not healing.  You have cuts or breaks in the skin.  You have an ingrown nail.  You notice redness on your legs or feet.  You feel burning or tingling in your legs or feet.  You have pain or cramps in your legs and feet.  Your legs or feet are numb.  Your feet always feel cold. SEEK IMMEDIATE  MEDICAL CARE IF:   There is increasing redness, swelling, or pain in or around a wound.  There is a red line that goes up your leg.  Pus is coming from a wound.  You develop a fever or as directed by your health care provider.  You notice a bad smell coming from an ulcer or wound. Document Released: 02/09/2000 Document Revised: 10/14/2012 Document Reviewed: 07/21/2012 [facility] Patient Information 2015 [place], [place]. This information is not intended to replace advice given to you by your health care provider. Make sure you discuss any questions you have with your health care provider.   Please see your eye doctor yearly to check for diabetic eye damage

## 2014-09-07 ENCOUNTER — Other Ambulatory Visit: Payer: Self-pay | Admitting: Internal Medicine

## 2014-09-07 DIAGNOSIS — E1159 Type 2 diabetes mellitus with other circulatory complications: Secondary | ICD-10-CM

## 2014-09-08 ENCOUNTER — Ambulatory Visit (INDEPENDENT_AMBULATORY_CARE_PROVIDER_SITE_OTHER): Payer: BC Managed Care – PPO | Admitting: Internal Medicine

## 2014-09-08 ENCOUNTER — Encounter: Payer: Self-pay | Admitting: Internal Medicine

## 2014-09-08 VITALS — BP 112/78 | HR 105 | Temp 98.6°F | Resp 20 | Ht 76.25 in | Wt 266.0 lb

## 2014-09-08 DIAGNOSIS — E1159 Type 2 diabetes mellitus with other circulatory complications: Secondary | ICD-10-CM

## 2014-09-08 DIAGNOSIS — I10 Essential (primary) hypertension: Secondary | ICD-10-CM

## 2014-09-08 DIAGNOSIS — E1151 Type 2 diabetes mellitus with diabetic peripheral angiopathy without gangrene: Secondary | ICD-10-CM

## 2014-09-08 MED ORDER — LIRAGLUTIDE 18 MG/3ML ~~LOC~~ SOPN: 1.6000 mg | PEN_INJECTOR | Freq: Every day | SUBCUTANEOUS | Status: DC

## 2014-09-08 MED ORDER — LIRAGLUTIDE 18 MG/3ML ~~LOC~~ SOPN
1.6000 mg | PEN_INJECTOR | Freq: Every morning | SUBCUTANEOUS | Status: DC
Start: 2014-09-08 — End: 2014-10-06

## 2014-09-08 MED ORDER — INSULIN ASPART 100 UNIT/ML FLEXPEN: PEN_INJECTOR | SUBCUTANEOUS | Status: DC

## 2014-09-08 MED ORDER — INSULIN DEGLUDEC 200 UNIT/ML ~~LOC~~ SOPN: 20.0000 [IU] | PEN_INJECTOR | Freq: Every day | SUBCUTANEOUS | Status: DC

## 2014-09-08 NOTE — Patient Instructions (Addendum)
Endocrinology follow-up as scheduled  Discontinue glyburide  Check blood sugars 3 times daily before each meal  Humalog/Nololog prior to your evening meal.  As directed  Keep diary of your blood sugars and call early next week to consider further insulin titration

## 2014-09-08 NOTE — Progress Notes (Signed)
Subjective:    Patient ID: Gene Weeks, male    DOB: 1975-04-24, 39 y.o.   MRN: [id number]  HPI  39 year old patient who is seen today to initiate insulin therapy due to poorly controlled diabetes  Lab Results  Component Value Date   HGBA1C 11.5* 09/06/2014    He seems motivated, but is having a very difficult time due to cost.  He was seen recently and given samples of Victoza, which he was not able to afford.  No home blood sugar monitoring, also due to cost considerations.  The patient has been referred to endocrinology, consultation pending  He works a varied schedule and therefore has a variable bedtime.  His largest meal is his evening meal, which he eats quite late.  He often works to 10 or 11 PM.  Past Medical History  Diagnosis Date  . DIABETES MELLITUS, TYPE II 04/08/2008  . HYPERTENSION 04/08/2008  . OSTEOARTHRITIS 04/08/2008    History   Social History  . Marital Status: Single    Spouse Name: N/A  . Number of Children: N/A  . Years of Education: N/A   Occupational History  . Not on file.   Social History Main Topics  . Smoking status: Never Smoker   . Smokeless tobacco: Never Used  . Alcohol Use: No  . Drug Use: No  . Sexual Activity: Not on file   Other Topics Concern  . Not on file   Social History Narrative    Past Surgical History  Procedure Laterality Date  . Knee arthroscopy      left    Family History  Problem Relation Age of Onset  . Cancer Mother     colon ca  . Hypothyroidism Mother   . Hyperlipidemia Father     Allergies  Allergen Reactions  . Amoxicillin     Current Outpatient Prescriptions on File Prior to Visit  Medication Sig Dispense Refill  . aspirin EC 81 MG tablet Take 1 tablet (81 mg total) by mouth daily.    Marland Kitchen atorvastatin (LIPITOR) 20 MG tablet Take 1 tablet (20 mg total) by mouth daily. 90 tablet 3  . BAYER MICROLET LANCETS lancets 1 each by Other route 3 (three) times daily as needed for other. Use as  instructed 100 each 12  . glucose blood (BAYER CONTOUR NEXT TEST) test strip 1 each by Other route 3 (three) times daily as needed for other. Use as instructed 100 each 12  . ibuprofen (ADVIL,MOTRIN) 200 MG tablet Take 800 mg by mouth every 6 (six) hours as needed.    Marland Kitchen lisinopril (PRINIVIL,ZESTRIL) 20 MG tablet TAKE 1 TABLET BY MOUTH DAILY 90 tablet 1  . metFORMIN (GLUCOPHAGE-XR) 500 MG 24 hr tablet TAKE 2 TABLETS BY MOUTH TWICE DAILY 180 tablet 0  . NOVOFINE 32G X 6 MM MISC TEST ONCE DAILY AS NEEDED 100 each 0  . sildenafil (VIAGRA) 100 MG tablet Take 0.5-1 tablets (50-100 mg total) by mouth daily as needed for erectile dysfunction. 3 tablet 11  . traMADol (ULTRAM) 50 MG tablet Take 1 tablet (50 mg total) by mouth every 8 (eight) hours as needed. 90 tablet 2   No current facility-administered medications on file prior to visit.    BP 112/78 mmHg  Pulse 105  Temp(Src) 98.6 F (37 C) (Oral)  Resp 20  Ht 6' 4.25" (1.937 m)  Wt 266 lb (120.657 kg)  BMI 32.16 kg/m2  SpO2 98%     Review of Systems  No hyperglycemic  symptoms    Objective:   Physical Exam  Constitutional:  Blood pressure low normal Weight 266          Assessment & Plan:   Diabetes mellitus.  Poorly controlled.  Basal bolus insulin therapy started today with mealtime insulin at his largest meal only.  Will likely need multiple daily injections for glycemic control.  Patient remains on Victoza (samples provided) , but unclear whether he will be able to afford this medicine in the future.  Glipizide discontinued but will continue metformin  Will start Tresiba as basal insulin due to his variable bedtime schedule  A new home glucometer dispensed  Considerable diabetic information/instructions dispensed today  Detailed patient instructions discussed and dispensed concerning insulin therapy  Patient has been asked to call the office early next week for further titration of his insulin pending evaluation by  endocrine

## 2014-09-08 NOTE — Progress Notes (Signed)
Pre visit review using our clinic review tool, if applicable. No additional management support is needed unless otherwise documented below in the visit note. 

## 2014-09-09 ENCOUNTER — Other Ambulatory Visit: Payer: Self-pay | Admitting: *Deleted

## 2014-09-09 MED ORDER — ONETOUCH LANCETS MISC
Status: DC
Start: 2014-09-09 — End: 2017-12-17

## 2014-09-09 MED ORDER — GLUCOSE BLOOD VI STRP: ORAL_STRIP | Status: DC

## 2014-09-16 ENCOUNTER — Telehealth: Payer: Self-pay | Admitting: *Deleted

## 2014-09-16 NOTE — Telephone Encounter (Signed)
Pt called back. Asked him how his sugars have been running? Pt stated they are better and his feet feel a little better. Sugars are running: Breakfast 171-190, Lunch 94-168, Dinner 150-160. Pt said he is not taking Victoza every morning, did one morning and sugar dropped and did not feel good so has been trying without. Told him okay I will let Dr.K know and if you need anything please call. Pt verbalized understanding.

## 2014-09-16 NOTE — Telephone Encounter (Signed)
Noted.  Endocrine referral as scheduled

## 2014-09-16 NOTE — Telephone Encounter (Signed)
Left message on voicemail to call office. Called to see how pt's sugars are doing?

## 2014-09-16 NOTE — Telephone Encounter (Signed)
Dr. Kirtland Bouchard, [place] Child[place] see message.

## 2014-09-28 ENCOUNTER — Other Ambulatory Visit: Payer: Self-pay | Admitting: Internal Medicine

## 2014-10-06 ENCOUNTER — Ambulatory Visit (INDEPENDENT_AMBULATORY_CARE_PROVIDER_SITE_OTHER): Payer: BC Managed Care – PPO | Admitting: Endocrinology

## 2014-10-06 ENCOUNTER — Encounter: Payer: Self-pay | Admitting: Endocrinology

## 2014-10-06 VITALS — BP 138/80 | HR 95 | Temp 98.2°F | Ht 76.25 in | Wt 277.0 lb

## 2014-10-06 DIAGNOSIS — N529 Male erectile dysfunction, unspecified: Secondary | ICD-10-CM | POA: Insufficient documentation

## 2014-10-06 LAB — TESTOSTERONE: Testosterone: 449.16 ng/dL (ref 300.00–890.00)

## 2014-10-06 MED ORDER — INSULIN DEGLUDEC 200 UNIT/ML ~~LOC~~ SOPN: 30.0000 [IU] | PEN_INJECTOR | Freq: Every day | SUBCUTANEOUS | Status: DC

## 2014-10-06 NOTE — Progress Notes (Signed)
Subjective:    Patient ID: Gene Weeks, male    DOB: [birth year], 39 y.o.   MRN: [id number]  HPI pt states DM was dx'ed in 2001; he has mild neuropathy of the lower extremities, and associated PAD; he has been on insulin x 1 month; pt says his diet is not good, but exercise is very good; he has never had pancreatitis, severe hypoglycemia or DKA.  He says he sometimes does not fill prescriptions, due to cost (especially the victoza).  He takes novolog only at breakfast (8-12 units).  He seldom consumes EtOH.  he brings a record of his cbg's which i have reviewed today.  It varies from 75-185.   Past Medical History  Diagnosis Date  . DIABETES MELLITUS, TYPE II 04/08/2008  . HYPERTENSION 04/08/2008  . OSTEOARTHRITIS 04/08/2008    Past Surgical History  Procedure Laterality Date  . Knee arthroscopy      left    Social History   Social History  . Marital Status: Single    Spouse Name: N/A  . Number of Children: N/A  . Years of Education: N/A   Occupational History  . Not on file.   Social History Main Topics  . Smoking status: Never Smoker   . Smokeless tobacco: Never Used  . Alcohol Use: No  . Drug Use: No  . Sexual Activity: Not on file   Other Topics Concern  . Not on file   Social History Narrative    Current Outpatient Prescriptions on File Prior to Visit  Medication Sig Dispense Refill  . glucose blood (ONETOUCH VERIO) test strip USE TO CHECK BLOOD SUGAR THREE TIMES A DAY BEFORE MEALS AND PRN 100 each 12  . lisinopril (PRINIVIL,ZESTRIL) 20 MG tablet TAKE 1 TABLET BY MOUTH DAILY 90 tablet 0  . metFORMIN (GLUCOPHAGE-XR) 500 MG 24 hr tablet TAKE 2 TABLETS BY MOUTH TWICE DAILY 180 tablet 0  . NOVOFINE 32G X 6 MM MISC TEST ONCE DAILY AS NEEDED 100 each 0  . ONE TOUCH LANCETS MISC USE TO CHECK BLOOD SUGAR THREE TIMES A BEFORE MEALS AND PRN 100 each 12  . sildenafil (VIAGRA) 100 MG tablet Take 0.5-1 tablets (50-100 mg total) by mouth daily as needed for erectile  dysfunction. 3 tablet 11  . traMADol (ULTRAM) 50 MG tablet Take 1 tablet (50 mg total) by mouth every 8 (eight) hours as needed. 90 tablet 2  . aspirin EC 81 MG tablet Take 1 tablet (81 mg total) by mouth daily. (Patient not taking: Reported on 10/06/2014)    . atorvastatin (LIPITOR) 20 MG tablet Take 1 tablet (20 mg total) by mouth daily. (Patient not taking: Reported on 10/06/2014) 90 tablet 3  . ibuprofen (ADVIL,MOTRIN) 200 MG tablet Take 800 mg by mouth every 6 (six) hours as needed.     No current facility-administered medications on file prior to visit.    Allergies  Allergen Reactions  . Amoxicillin     Family History  Problem Relation Age of Onset  . Cancer Mother     colon ca  . Hypothyroidism Mother   . Hyperlipidemia Father   . Diabetes Cousin     BP 138/80 mmHg  Pulse 95  Temp(Src) 98.2 F (36.8 C) (Oral)  Ht 6' 4.25" (1.937 m)  Wt 277 lb (125.646 kg)  BMI 33.49 kg/m2  SpO2 97%  Review of Systems denies blurry vision, headache, chest pain, sob, n/v, urinary frequency, muscle cramps, excessive diaphoresis, depression, cold intolerance, rhinorrhea, and easy bruising.  He has ED sxs.  He has weight gain.     Objective:   Physical Exam VS: see vs page GEN: no distress HEAD: head: no deformity eyes: no periorbital swelling, no proptosis external nose and ears are normal mouth: no lesion seen NECK: supple, thyroid is not enlarged CHEST WALL: no deformity LUNGS: clear to auscultation BREASTS:  No gynecomastia.   CV: reg rate and rhythm, no murmur ABD: abdomen is soft, nontender.  no hepatosplenomegaly.  not distended.  no hernia MUSCULOSKELETAL: muscle bulk and strength are grossly normal.  no obvious joint swelling.  gait is normal and steady EXTEMITIES: no deformity.  no ulcer on the feet.  feet are of normal color and temp.  no edema PULSES: dorsalis pedis intact bilat.  no carotid bruit NEURO:  cn 2-12 grossly intact.   readily moves all 4's.  sensation is  intact to touch on the feet SKIN:  Normal texture and temperature.  No rash or suspicious lesion is visible.   NODES:  None palpable at the neck PSYCH: alert, well-oriented.  Does not appear anxious nor depressed.   Lab Results  Component Value Date   HGBA1C 11.5* 09/06/2014  i personally reviewed electrocardiogram tracing (02/17/13): only borderline abnormal. Lab Results  Component Value Date   TESTOSTERONE 449.16 10/06/2014      Assessment & Plan:  DM: severe exacerbation.  therapy limited by multiple copays. Weight gain: new to me ED: new to me: i offered ref urol.  Patient is advised the following: Patient Instructions  good diet and exercise significantly improve the control of your diabetes.  please let me know if you wish to be referred to a dietician.  high blood sugar is very risky to your health.  you should see an eye doctor and dentist every year.  It is very important to get all recommended vaccinations.  controlling your blood pressure and cholesterol drastically reduces the damage diabetes does to your body.  Those who smoke should quit.  please discuss these with your doctor.  check your blood sugar twice a day.  vary the time of day when you check, between before the 3 meals, and at bedtime.  also check if you have symptoms of your blood sugar being too high or too low.  please keep a record of the readings and bring it to your next appointment here.  You can write it on any piece of paper.  please call us sooner if your blood sugar goes below 70, or if you have a lot of readings over 200. For now, please stop taking the victoza and novolog.   Please increase the tresiba to 30 units daily.   Please continue the same metformin.    Please call us next week, to tell us how the blood sugar is doing.   Please come back for a follow-up appointment in 6 weeks.   blood tests are requested for you today.  We'll let you know about the results.  If this is normal, i would be happy  to refer you to a specialist.

## 2014-10-06 NOTE — Patient Instructions (Addendum)
good diet and exercise significantly improve the control of your diabetes.  please let me know if you wish to be referred to a dietician.  high blood sugar is very risky to your health.  you should see an eye doctor and dentist every year.  It is very important to get all recommended vaccinations.  controlling your blood pressure and cholesterol drastically reduces the damage diabetes does to your body.  Those who smoke should quit.  please discuss these with your doctor.  check your blood sugar twice a day.  vary the time of day when you check, between before the 3 meals, and at bedtime.  also check if you have symptoms of your blood sugar being too high or too low.  please keep a record of the readings and bring it to your next appointment here.  You can write it on any piece of paper.  please call us sooner if your blood sugar goes below 70, or if you have a lot of readings over 200. For now, please stop taking the victoza and novolog.   Please increase the tresiba to 30 units daily.   Please continue the same metformin.    Please call us next week, to tell us how the blood sugar is doing.   Please come back for a follow-up appointment in 6 weeks.   blood tests are requested for you today.  We'll let you know about the results.  If this is normal, i would be happy to refer you to a specialist.

## 2014-11-05 ENCOUNTER — Other Ambulatory Visit: Payer: Self-pay | Admitting: Internal Medicine

## 2014-11-17 ENCOUNTER — Ambulatory Visit (INDEPENDENT_AMBULATORY_CARE_PROVIDER_SITE_OTHER): Payer: BC Managed Care – PPO | Admitting: Endocrinology

## 2014-11-17 ENCOUNTER — Encounter: Payer: Self-pay | Admitting: Endocrinology

## 2014-11-17 VITALS — BP 118/86 | HR 107 | Temp 98.2°F | Resp 17 | Ht 77.0 in | Wt 285.0 lb

## 2014-11-17 DIAGNOSIS — E1159 Type 2 diabetes mellitus with other circulatory complications: Secondary | ICD-10-CM

## 2014-11-17 DIAGNOSIS — E1151 Type 2 diabetes mellitus with diabetic peripheral angiopathy without gangrene: Secondary | ICD-10-CM | POA: Diagnosis not present

## 2014-11-17 MED ORDER — CANAGLIFLOZIN 300 MG PO TABS: 300.0000 mg | ORAL_TABLET | Freq: Every day | ORAL | Status: DC

## 2014-11-17 NOTE — Progress Notes (Signed)
Subjective:    Patient ID: Gene Weeks, male    DOB: 11-04-1975, 39 y.o.   MRN: [id number]  HPI Pt returns for f/u of diabetes mellitus: DM type: Insulin-requiring type 2 Dx'ed: 2001 Complications: polyneuropathy and PAD Therapy: insulin since mid-2016 DKA: never Severe hypoglycemia: never Pancreatitis: never Other: therapy has been limited by pt's need for low copays. Interval history: no cbg record, but states cbg's vary from 69-250.  It is in general higher as the day goes on.  pt states he feels well in general.  He wants to d/c insulin and also minimize copays, if possible.   Past Medical History  Diagnosis Date  . DIABETES MELLITUS, TYPE II 04/08/2008  . HYPERTENSION 04/08/2008  . OSTEOARTHRITIS 04/08/2008    Past Surgical History  Procedure Laterality Date  . Knee arthroscopy      left    Social History   Social History  . Marital Status: Single    Spouse Name: N/A  . Number of Children: N/A  . Years of Education: N/A   Occupational History  . Not on file.   Social History Main Topics  . Smoking status: Never Smoker   . Smokeless tobacco: Never Used  . Alcohol Use: No  . Drug Use: No  . Sexual Activity: Not on file   Other Topics Concern  . Not on file   Social History Narrative    Current Outpatient Prescriptions on File Prior to Visit  Medication Sig Dispense Refill  . glucose blood (ONETOUCH VERIO) test strip USE TO CHECK BLOOD SUGAR THREE TIMES A DAY BEFORE MEALS AND PRN 100 each 12  . ibuprofen (ADVIL,MOTRIN) 200 MG tablet Take 800 mg by mouth every 6 (six) hours as needed.    . Insulin Degludec (TRESIBA FLEXTOUCH) 200 UNIT/ML SOPN Inject 30 Units into the skin at bedtime. 3 mL 6  . lisinopril (PRINIVIL,ZESTRIL) 20 MG tablet TAKE 1 TABLET BY MOUTH DAILY 90 tablet 0  . metFORMIN (GLUCOPHAGE-XR) 500 MG 24 hr tablet TAKE 2 TABLETS BY MOUTH TWICE DAILY 180 tablet 0  . NOVOFINE 32G X 6 MM MISC TEST ONCE DAILY AS NEEDED 100 each 4  . ONE TOUCH  LANCETS MISC USE TO CHECK BLOOD SUGAR THREE TIMES A BEFORE MEALS AND PRN 100 each 12  . sildenafil (VIAGRA) 100 MG tablet Take 0.5-1 tablets (50-100 mg total) by mouth daily as needed for erectile dysfunction. 3 tablet 11  . aspirin EC 81 MG tablet Take 1 tablet (81 mg total) by mouth daily. (Patient not taking: Reported on 10/06/2014)    . atorvastatin (LIPITOR) 20 MG tablet Take 1 tablet (20 mg total) by mouth daily. (Patient not taking: Reported on 10/06/2014) 90 tablet 3  . traMADol (ULTRAM) 50 MG tablet Take 1 tablet (50 mg total) by mouth every 8 (eight) hours as needed. (Patient not taking: Reported on 11/17/2014) 90 tablet 2   No current facility-administered medications on file prior to visit.    Allergies  Allergen Reactions  . Amoxicillin     Family History  Problem Relation Age of Onset  . Cancer Mother     colon ca  . Hypothyroidism Mother   . Hyperlipidemia Father   . Diabetes Cousin     BP 118/86 mmHg  Pulse 107  Temp(Src) 98.2 F (36.8 C) (Oral)  Resp 17  Ht 6\' 5"  (1.956 m)  Wt 285 lb (129.275 kg)  BMI 33.79 kg/m2  SpO2 97%   Review of Systems He denies weight  change.     Objective:   Physical Exam VITAL SIGNS:  See vs page GENERAL: no distress Ext: no edema.    Lab Results  Component Value Date   CREATININE 0.9 10/26/2013   BUN 12 10/26/2013   NA 134* 10/26/2013   K 3.9 10/26/2013   CL 98 10/26/2013   CO2 28 10/26/2013       Assessment & Plan:  DM: i told pt we'll try to rx low low copays, and d/c insulin if possible.   Patient is advised the following: Patient Instructions  check your blood sugar twice a day.  vary the time of day when you check, between before the 3 meals, and at bedtime.  also check if you have symptoms of your blood sugar being too high or too low.  please keep a record of the readings and bring it to your next appointment here.  You can write it on any piece of paper.  please call us sooner if your blood sugar goes below  70, or if you have a lot of readings over 200.   i have sent a prescription to your pharmacy, to add "invokana."  Please continue the same metformin and tresiba.   Please come back for a follow-up appointment in 6 weeks.

## 2014-11-17 NOTE — Patient Instructions (Addendum)
check your blood sugar twice a day.  vary the time of day when you check, between before the 3 meals, and at bedtime.  also check if you have symptoms of your blood sugar being too high or too low.  please keep a record of the readings and bring it to your next appointment here.  You can write it on any piece of paper.  please call us sooner if your blood sugar goes below 70, or if you have a lot of readings over 200.   i have sent a prescription to your pharmacy, to add "invokana."  Please continue the same metformin and tresiba.   Please come back for a follow-up appointment in 6 weeks.

## 2014-11-21 ENCOUNTER — Other Ambulatory Visit: Payer: Self-pay | Admitting: Internal Medicine

## 2014-12-20 ENCOUNTER — Other Ambulatory Visit: Payer: Self-pay | Admitting: Internal Medicine

## 2014-12-21 ENCOUNTER — Telehealth: Payer: Self-pay | Admitting: Endocrinology

## 2014-12-21 ENCOUNTER — Telehealth: Payer: Self-pay | Admitting: Internal Medicine

## 2014-12-21 NOTE — Telephone Encounter (Signed)
Pt is not on Novolog he is on [place] and is followed by Dr. Everardo All for diabetes.

## 2014-12-21 NOTE — Telephone Encounter (Signed)
Pt will call dr Everardo All office

## 2014-12-21 NOTE — Telephone Encounter (Signed)
Pt needs samples of novolog flex pen and needles. Pt is out and has no money

## 2014-12-21 NOTE — Telephone Encounter (Signed)
Patient called stating that he is currently out of novolog insulin  He wanted to know if we have a sample pen ?  Please advise patient   Pharmacy: [facility] [address]    Thank you

## 2014-12-22 NOTE — Telephone Encounter (Signed)
Left voicemail advising the patient we not keep samples for medications in our office.

## 2014-12-29 ENCOUNTER — Ambulatory Visit: Payer: BC Managed Care – PPO | Admitting: Endocrinology

## 2014-12-29 DIAGNOSIS — Z0289 Encounter for other administrative examinations: Secondary | ICD-10-CM

## 2015-01-24 ENCOUNTER — Other Ambulatory Visit: Payer: Self-pay | Admitting: Internal Medicine

## 2015-03-22 ENCOUNTER — Telehealth: Payer: Self-pay | Admitting: Endocrinology

## 2015-03-22 NOTE — Telephone Encounter (Signed)
please call patient: ov is due

## 2015-03-23 NOTE — Telephone Encounter (Signed)
Left a voicemail advising of note below. Requested the pt to call back and schedule his appointment.

## 2015-03-27 ENCOUNTER — Ambulatory Visit (INDEPENDENT_AMBULATORY_CARE_PROVIDER_SITE_OTHER): Payer: BC Managed Care – PPO | Admitting: Endocrinology

## 2015-03-27 ENCOUNTER — Encounter: Payer: Self-pay | Admitting: Endocrinology

## 2015-03-27 VITALS — BP 152/92 | HR 85 | Temp 98.7°F | Ht 77.0 in | Wt 291.0 lb

## 2015-03-27 DIAGNOSIS — E1159 Type 2 diabetes mellitus with other circulatory complications: Secondary | ICD-10-CM | POA: Diagnosis not present

## 2015-03-27 LAB — POCT GLYCOSYLATED HEMOGLOBIN (HGB A1C): HEMOGLOBIN A1C: 8.2

## 2015-03-27 MED ORDER — GLIPIZIDE ER 10 MG PO TB24: 10.0000 mg | ORAL_TABLET | Freq: Every day | ORAL | Status: DC

## 2015-03-27 MED ORDER — DAPAGLIFLOZIN PROPANEDIOL 10 MG PO TABS: 10.0000 mg | ORAL_TABLET | Freq: Every day | ORAL | Status: DC

## 2015-03-27 MED ORDER — METFORMIN HCL ER 500 MG PO TB24: 1000.0000 mg | ORAL_TABLET | Freq: Two times a day (BID) | ORAL | Status: DC

## 2015-03-27 NOTE — Progress Notes (Signed)
Subjective:    Patient ID: Gene Weeks, male    DOB: [birth year], 40 y.o.   MRN: [id number]  HPI Pt returns for f/u of diabetes mellitus: DM type: Insulin-requiring type 2 Dx'ed: 2001 Complications: polyneuropathy and PAD Therapy: insulin (since mid-2016), and 3 oral meds DKA: never Severe hypoglycemia: never Pancreatitis: never Other: therapy has been limited by pt's need for low copays. Interval history:  pt states he feels well in general.  He wants to d/c insulin and also minimize copays, if possible.  He takes tresiba, 16-30 units qd.  He also takes novolog, 0-17 units, 3 times a day (just before each meal) Past Medical History  Diagnosis Date  . DIABETES MELLITUS, TYPE II 04/08/2008  . HYPERTENSION 04/08/2008  . OSTEOARTHRITIS 04/08/2008    Past Surgical History  Procedure Laterality Date  . Knee arthroscopy      left    Social History   Social History  . Marital Status: Single    Spouse Name: N/A  . Number of Children: N/A  . Years of Education: N/A   Occupational History  . Not on file.   Social History Main Topics  . Smoking status: Never Smoker   . Smokeless tobacco: Never Used  . Alcohol Use: No  . Drug Use: No  . Sexual Activity: Not on file   Other Topics Concern  . Not on file   Social History Narrative    Current Outpatient Prescriptions on File Prior to Visit  Medication Sig Dispense Refill  . glucose blood (ONETOUCH VERIO) test strip USE TO CHECK BLOOD SUGAR THREE TIMES A DAY BEFORE MEALS AND PRN 100 each 12  . lisinopril (PRINIVIL,ZESTRIL) 20 MG tablet TAKE 1 TABLET BY MOUTH DAILY 90 tablet 0  . NOVOFINE 32G X 6 MM MISC TEST ONCE DAILY AS NEEDED 100 each 4  . ONE TOUCH LANCETS MISC USE TO CHECK BLOOD SUGAR THREE TIMES A BEFORE MEALS AND PRN 100 each 12  . aspirin EC 81 MG tablet Take 1 tablet (81 mg total) by mouth daily. (Patient not taking: Reported on 10/06/2014)    . atorvastatin (LIPITOR) 20 MG tablet Take 1 tablet (20 mg total) by  mouth daily. (Patient not taking: Reported on 10/06/2014) 90 tablet 3  . ibuprofen (ADVIL,MOTRIN) 200 MG tablet Take 800 mg by mouth every 6 (six) hours as needed. Reported on 03/27/2015    . sildenafil (VIAGRA) 100 MG tablet Take 0.5-1 tablets (50-100 mg total) by mouth daily as needed for erectile dysfunction. (Patient not taking: Reported on 03/27/2015) 3 tablet 11  . traMADol (ULTRAM) 50 MG tablet Take 1 tablet (50 mg total) by mouth every 8 (eight) hours as needed. (Patient not taking: Reported on 11/17/2014) 90 tablet 2   No current facility-administered medications on file prior to visit.    Allergies  Allergen Reactions  . Amoxicillin     Family History  Problem Relation Age of Onset  . Cancer Mother     colon ca  . Hypothyroidism Mother   . Hyperlipidemia Father   . Diabetes Cousin     BP 152/92 mmHg  Pulse 85  Temp(Src) 98.7 F (37.1 C) (Oral)  Ht  (1.956 m)  Wt 291 lb (131.997 kg)  BMI 34.50 kg/m2  SpO2 96%  Review of Systems He denies hypoglycemia    Objective:   Physical Exam VITAL SIGNS:  See vs page GENERAL: no distress Pulses: dorsalis pedis intact bilat.   MSK: no deformity of the feet  CV: no leg edema Skin:  no ulcer on the feet.  normal color and temp on the feet.  Neuro: sensation is intact to touch on the feet.   A1c=8.2%    Assessment & Plan:  DM: he may be manageable off insulin.    Patient is advised the following: Patient Instructions  Please resume the glipizide and metformin.   i have also sent a prescription to your pharmacy, to change invokana to farxiga. If this does not work, please call, and I'll do the prior authorization for invokana.  Please try stopping the tresiba, and take the novolog as your only insulin. Out goal is to see if you can get by without the insulin.   Please call next week, to tell us how the blood sugar is doing.  If necessary, i can prescribe for an additional pill, to further reduce your need for insulin.     Please come back for a follow-up appointment in 1 month.  check your blood sugar twice a day.  vary the time of day when you check, between before the 3 meals, and at bedtime.  also check if you have symptoms of your blood sugar being too high or too low.  please keep a record of the readings and bring it to your next appointment here (or you can bring the meter itself).  You can write it on any piece of paper.  please call us sooner if your blood sugar goes below 70, or if you have a lot of readings over 200.

## 2015-03-27 NOTE — Patient Instructions (Addendum)
Please resume the glipizide and metformin.   i have also sent a prescription to your pharmacy, to change invokana to farxiga. If this does not work, please call, and I'll do the prior authorization for invokana.  Please try stopping the tresiba, and take the novolog as your only insulin. Out goal is to see if you can get by without the insulin.   Please call next week, to tell us how the blood sugar is doing.  If necessary, i can prescribe for an additional pill, to further reduce your need for insulin.   Please come back for a follow-up appointment in 1 month.  check your blood sugar twice a day.  vary the time of day when you check, between before the 3 meals, and at bedtime.  also check if you have symptoms of your blood sugar being too high or too low.  please keep a record of the readings and bring it to your next appointment here (or you can bring the meter itself).  You can write it on any piece of paper.  please call us sooner if your blood sugar goes below 70, or if you have a lot of readings over 200.

## 2015-04-26 ENCOUNTER — Emergency Department (HOSPITAL_COMMUNITY)
Admission: EM | Admit: 2015-04-26 | Discharge: 2015-04-26 | Disposition: A | Discharge status: Home / Self Care | Payer: BC Managed Care – PPO | Attending: Emergency Medicine | Admitting: Emergency Medicine

## 2015-04-26 ENCOUNTER — Encounter (HOSPITAL_COMMUNITY): Payer: Self-pay | Admitting: Emergency Medicine

## 2015-04-26 ENCOUNTER — Emergency Department (HOSPITAL_COMMUNITY): Payer: BC Managed Care – PPO

## 2015-04-26 DIAGNOSIS — Z880 Allergy status to penicillin: Secondary | ICD-10-CM | POA: Diagnosis not present

## 2015-04-26 DIAGNOSIS — S0181XA Laceration without foreign body of other part of head, initial encounter: Secondary | ICD-10-CM | POA: Insufficient documentation

## 2015-04-26 DIAGNOSIS — S12501A Unspecified nondisplaced fracture of sixth cervical vertebra, initial encounter for closed fracture: Secondary | ICD-10-CM | POA: Insufficient documentation

## 2015-04-26 DIAGNOSIS — S12601A Unspecified nondisplaced fracture of seventh cervical vertebra, initial encounter for closed fracture: Secondary | ICD-10-CM | POA: Diagnosis not present

## 2015-04-26 DIAGNOSIS — S0990XA Unspecified injury of head, initial encounter: Secondary | ICD-10-CM | POA: Diagnosis not present

## 2015-04-26 DIAGNOSIS — Y92410 Unspecified street and highway as the place of occurrence of the external cause: Secondary | ICD-10-CM | POA: Diagnosis not present

## 2015-04-26 DIAGNOSIS — Y9389 Activity, other specified: Secondary | ICD-10-CM | POA: Diagnosis not present

## 2015-04-26 DIAGNOSIS — Z7984 Long term (current) use of oral hypoglycemic drugs: Secondary | ICD-10-CM | POA: Insufficient documentation

## 2015-04-26 DIAGNOSIS — I10 Essential (primary) hypertension: Secondary | ICD-10-CM | POA: Insufficient documentation

## 2015-04-26 DIAGNOSIS — E119 Type 2 diabetes mellitus without complications: Secondary | ICD-10-CM | POA: Diagnosis not present

## 2015-04-26 DIAGNOSIS — Z23 Encounter for immunization: Secondary | ICD-10-CM | POA: Insufficient documentation

## 2015-04-26 DIAGNOSIS — Z79899 Other long term (current) drug therapy: Secondary | ICD-10-CM | POA: Diagnosis not present

## 2015-04-26 DIAGNOSIS — S129XXA Fracture of neck, unspecified, initial encounter: Secondary | ICD-10-CM

## 2015-04-26 DIAGNOSIS — Y998 Other external cause status: Secondary | ICD-10-CM | POA: Diagnosis not present

## 2015-04-26 DIAGNOSIS — Z794 Long term (current) use of insulin: Secondary | ICD-10-CM | POA: Diagnosis not present

## 2015-04-26 DIAGNOSIS — S199XXA Unspecified injury of neck, initial encounter: Secondary | ICD-10-CM | POA: Diagnosis present

## 2015-04-26 DIAGNOSIS — M1990 Unspecified osteoarthritis, unspecified site: Secondary | ICD-10-CM | POA: Diagnosis not present

## 2015-04-26 DIAGNOSIS — R55 Syncope and collapse: Secondary | ICD-10-CM | POA: Insufficient documentation

## 2015-04-26 MED ORDER — METHOCARBAMOL 500 MG PO TABS
500.0000 mg | ORAL_TABLET | Freq: Two times a day (BID) | ORAL | Status: DC
Start: 2015-04-26 — End: 2015-06-07

## 2015-04-26 MED ORDER — HYDROCODONE-ACETAMINOPHEN 5-325 MG PO TABS
2.0000 | ORAL_TABLET | Freq: Once | ORAL | Status: AC
  Administered 2015-04-26: 2 via ORAL
  Filled 2015-04-26: qty 2

## 2015-04-26 MED ORDER — HYDROCODONE-ACETAMINOPHEN 5-325 MG PO TABS: 2.0000 | ORAL_TABLET | ORAL | Status: DC | PRN

## 2015-04-26 MED ORDER — TETANUS-DIPHTH-ACELL PERTUSSIS 5-2.5-18.5 LF-MCG/0.5 IM SUSP
0.5000 mL | Freq: Once | INTRAMUSCULAR | Status: AC
  Administered 2015-04-26: 0.5 mL via INTRAMUSCULAR
  Filled 2015-04-26: qty 0.5
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
## 2015-05-03 ENCOUNTER — Other Ambulatory Visit: Payer: Self-pay | Admitting: *Deleted

## 2015-05-03 MED ORDER — HYDROCODONE-ACETAMINOPHEN 5-325 MG PO TABS: 2.0000 | ORAL_TABLET | ORAL | Status: DC | PRN
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
## 2015-05-05 ENCOUNTER — Other Ambulatory Visit: Payer: Self-pay | Admitting: Neurosurgery

## 2015-05-05 DIAGNOSIS — S12501A Unspecified nondisplaced fracture of sixth cervical vertebra, initial encounter for closed fracture: Secondary | ICD-10-CM

## 2015-05-12 ENCOUNTER — Other Ambulatory Visit: Payer: Self-pay | Admitting: Internal Medicine

## 2015-05-13 ENCOUNTER — Ambulatory Visit
Admission: RE | Admit: 2015-05-13 | Discharge: 2015-05-13 | Disposition: A | Discharge status: Home / Self Care | Payer: BC Managed Care – PPO | Source: Ambulatory Visit | Attending: Neurosurgery | Admitting: Neurosurgery

## 2015-05-13 DIAGNOSIS — S12501A Unspecified nondisplaced fracture of sixth cervical vertebra, initial encounter for closed fracture: Secondary | ICD-10-CM

## 2015-06-06 ENCOUNTER — Ambulatory Visit: Payer: BC Managed Care – PPO | Admitting: Physical Medicine & Rehabilitation

## 2015-06-07 ENCOUNTER — Encounter
Payer: BC Managed Care – PPO | Attending: Physical Medicine & Rehabilitation | Admitting: Physical Medicine & Rehabilitation

## 2015-06-07 ENCOUNTER — Encounter: Payer: Self-pay | Admitting: Physical Medicine & Rehabilitation

## 2015-06-07 VITALS — BP 156/73 | HR 107

## 2015-06-07 DIAGNOSIS — M25522 Pain in left elbow: Secondary | ICD-10-CM | POA: Diagnosis not present

## 2015-06-07 DIAGNOSIS — S4992XA Unspecified injury of left shoulder and upper arm, initial encounter: Secondary | ICD-10-CM | POA: Diagnosis not present

## 2015-06-07 DIAGNOSIS — Z79899 Other long term (current) drug therapy: Secondary | ICD-10-CM

## 2015-06-07 DIAGNOSIS — G894 Chronic pain syndrome: Secondary | ICD-10-CM | POA: Insufficient documentation

## 2015-06-07 DIAGNOSIS — E119 Type 2 diabetes mellitus without complications: Secondary | ICD-10-CM | POA: Insufficient documentation

## 2015-06-07 DIAGNOSIS — M25512 Pain in left shoulder: Secondary | ICD-10-CM | POA: Insufficient documentation

## 2015-06-07 DIAGNOSIS — F0781 Postconcussional syndrome: Secondary | ICD-10-CM | POA: Diagnosis not present

## 2015-06-07 DIAGNOSIS — R4189 Other symptoms and signs involving cognitive functions and awareness: Secondary | ICD-10-CM | POA: Diagnosis present

## 2015-06-07 DIAGNOSIS — I10 Essential (primary) hypertension: Secondary | ICD-10-CM | POA: Diagnosis not present

## 2015-06-07 DIAGNOSIS — M1990 Unspecified osteoarthritis, unspecified site: Secondary | ICD-10-CM | POA: Diagnosis not present

## 2015-06-07 DIAGNOSIS — S5401XA Injury of ulnar nerve at forearm level, right arm, initial encounter: Secondary | ICD-10-CM

## 2015-06-07 DIAGNOSIS — R51 Headache: Secondary | ICD-10-CM | POA: Insufficient documentation

## 2015-06-07 DIAGNOSIS — G4700 Insomnia, unspecified: Secondary | ICD-10-CM | POA: Insufficient documentation

## 2015-06-07 DIAGNOSIS — R202 Paresthesia of skin: Secondary | ICD-10-CM | POA: Diagnosis not present

## 2015-06-07 DIAGNOSIS — Z5181 Encounter for therapeutic drug level monitoring: Secondary | ICD-10-CM | POA: Diagnosis not present

## 2015-06-07 DIAGNOSIS — S12500A Unspecified displaced fracture of sixth cervical vertebra, initial encounter for closed fracture: Secondary | ICD-10-CM | POA: Diagnosis not present

## 2015-06-07 DIAGNOSIS — F4310 Post-traumatic stress disorder, unspecified: Secondary | ICD-10-CM | POA: Insufficient documentation

## 2015-06-07 DIAGNOSIS — S5401XS Injury of ulnar nerve at forearm level, right arm, sequela: Secondary | ICD-10-CM

## 2015-06-07 HISTORY — DX: Injury of ulnar nerve at forearm level, right arm, initial encounter: S54.01XA

## 2015-06-07 MED ORDER — TOPIRAMATE 25 MG PO TABS: 25.0000 mg | ORAL_TABLET | Freq: Every day | ORAL | Status: DC

## 2015-06-07 MED ORDER — ESCITALOPRAM OXALATE 5 MG PO TABS: 5.0000 mg | ORAL_TABLET | Freq: Every day | ORAL | Status: DC
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
## 2015-06-07 NOTE — Patient Instructions (Signed)
  PLEASE CALL ME WITH ANY PROBLEMS OR QUESTIONS (#[phone]).      

## 2015-06-13 LAB — TOXASSURE SELECT,+ANTIDEPR,UR: PDF: 0

## 2015-06-15 NOTE — Addendum Note (Signed)
Addended by: Doreene Eland on: 06/15/2015 09:57 AM   Modules accepted: Orders, Medications

## 2015-06-15 NOTE — Progress Notes (Signed)
Urine drug screen for this encounter is consistent for no prescribed narcotic medication 

## 2015-06-23 ENCOUNTER — Ambulatory Visit: Payer: BC Managed Care – PPO | Attending: Psychology | Admitting: Psychology

## 2015-06-23 DIAGNOSIS — F0781 Postconcussional syndrome: Secondary | ICD-10-CM | POA: Insufficient documentation

## 2015-06-23 DIAGNOSIS — F4310 Post-traumatic stress disorder, unspecified: Secondary | ICD-10-CM | POA: Diagnosis not present
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
## 2015-06-27 ENCOUNTER — Encounter: Payer: Self-pay | Admitting: Psychology

## 2015-06-30 ENCOUNTER — Ambulatory Visit: Payer: BC Managed Care – PPO | Attending: Psychology | Admitting: Psychology

## 2015-06-30 ENCOUNTER — Encounter: Payer: Self-pay | Admitting: Psychology

## 2015-06-30 DIAGNOSIS — F0781 Postconcussional syndrome: Secondary | ICD-10-CM | POA: Insufficient documentation

## 2015-06-30 DIAGNOSIS — F4310 Post-traumatic stress disorder, unspecified: Secondary | ICD-10-CM | POA: Diagnosis not present
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
## 2015-07-04 ENCOUNTER — Encounter
Payer: BC Managed Care – PPO | Attending: Physical Medicine & Rehabilitation | Admitting: Physical Medicine & Rehabilitation

## 2015-07-04 ENCOUNTER — Encounter: Payer: Self-pay | Admitting: Physical Medicine & Rehabilitation

## 2015-07-04 VITALS — BP 133/72 | HR 102 | Resp 15

## 2015-07-04 DIAGNOSIS — S5401XS Injury of ulnar nerve at forearm level, right arm, sequela: Secondary | ICD-10-CM | POA: Diagnosis not present

## 2015-07-04 DIAGNOSIS — I10 Essential (primary) hypertension: Secondary | ICD-10-CM | POA: Insufficient documentation

## 2015-07-04 DIAGNOSIS — M25512 Pain in left shoulder: Secondary | ICD-10-CM | POA: Insufficient documentation

## 2015-07-04 DIAGNOSIS — R51 Headache: Secondary | ICD-10-CM | POA: Diagnosis not present

## 2015-07-04 DIAGNOSIS — E119 Type 2 diabetes mellitus without complications: Secondary | ICD-10-CM | POA: Insufficient documentation

## 2015-07-04 DIAGNOSIS — F4310 Post-traumatic stress disorder, unspecified: Secondary | ICD-10-CM

## 2015-07-04 DIAGNOSIS — S4992XA Unspecified injury of left shoulder and upper arm, initial encounter: Secondary | ICD-10-CM | POA: Insufficient documentation

## 2015-07-04 DIAGNOSIS — S12500A Unspecified displaced fracture of sixth cervical vertebra, initial encounter for closed fracture: Secondary | ICD-10-CM

## 2015-07-04 DIAGNOSIS — G4700 Insomnia, unspecified: Secondary | ICD-10-CM | POA: Insufficient documentation

## 2015-07-04 DIAGNOSIS — X58XXXA Exposure to other specified factors, initial encounter: Secondary | ICD-10-CM | POA: Insufficient documentation

## 2015-07-04 DIAGNOSIS — M25522 Pain in left elbow: Secondary | ICD-10-CM | POA: Insufficient documentation

## 2015-07-04 DIAGNOSIS — R4189 Other symptoms and signs involving cognitive functions and awareness: Secondary | ICD-10-CM | POA: Insufficient documentation

## 2015-07-04 DIAGNOSIS — F0781 Postconcussional syndrome: Secondary | ICD-10-CM | POA: Diagnosis present

## 2015-07-04 DIAGNOSIS — G894 Chronic pain syndrome: Secondary | ICD-10-CM | POA: Insufficient documentation

## 2015-07-04 DIAGNOSIS — M1990 Unspecified osteoarthritis, unspecified site: Secondary | ICD-10-CM | POA: Insufficient documentation

## 2015-07-04 DIAGNOSIS — R202 Paresthesia of skin: Secondary | ICD-10-CM | POA: Insufficient documentation

## 2015-07-04 MED ORDER — ESCITALOPRAM OXALATE 10 MG PO TABS: 10.0000 mg | ORAL_TABLET | Freq: Every day | ORAL | Status: DC

## 2015-07-04 NOTE — Patient Instructions (Signed)
ASK PT IF THEY HAVE ORDERS TO WORK ON YOUR LEFT ARM.
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
## 2015-07-07 ENCOUNTER — Ambulatory Visit (INDEPENDENT_AMBULATORY_CARE_PROVIDER_SITE_OTHER): Payer: BC Managed Care – PPO | Admitting: Psychology

## 2015-07-07 DIAGNOSIS — F0781 Postconcussional syndrome: Secondary | ICD-10-CM

## 2015-07-07 DIAGNOSIS — F4310 Post-traumatic stress disorder, unspecified: Secondary | ICD-10-CM | POA: Diagnosis not present
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
## 2015-07-10 ENCOUNTER — Other Ambulatory Visit: Payer: Self-pay | Admitting: Neurosurgery

## 2015-07-10 DIAGNOSIS — S12501A Unspecified nondisplaced fracture of sixth cervical vertebra, initial encounter for closed fracture: Secondary | ICD-10-CM

## 2015-07-14 ENCOUNTER — Ambulatory Visit
Admission: RE | Admit: 2015-07-14 | Discharge: 2015-07-14 | Disposition: A | Discharge status: Home / Self Care | Payer: BC Managed Care – PPO | Source: Ambulatory Visit | Attending: Neurosurgery | Admitting: Neurosurgery

## 2015-07-14 ENCOUNTER — Ambulatory Visit (INDEPENDENT_AMBULATORY_CARE_PROVIDER_SITE_OTHER): Payer: BC Managed Care – PPO | Admitting: Psychology

## 2015-07-14 DIAGNOSIS — F4310 Post-traumatic stress disorder, unspecified: Secondary | ICD-10-CM | POA: Diagnosis not present

## 2015-07-14 DIAGNOSIS — S12501A Unspecified nondisplaced fracture of sixth cervical vertebra, initial encounter for closed fracture: Secondary | ICD-10-CM
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
## 2015-07-17 ENCOUNTER — Encounter: Payer: Self-pay | Admitting: Psychology

## 2015-07-21 ENCOUNTER — Ambulatory Visit (INDEPENDENT_AMBULATORY_CARE_PROVIDER_SITE_OTHER): Payer: BC Managed Care – PPO | Admitting: Psychology

## 2015-07-21 DIAGNOSIS — F4310 Post-traumatic stress disorder, unspecified: Secondary | ICD-10-CM
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
## 2015-08-02 ENCOUNTER — Encounter
Payer: BC Managed Care – PPO | Attending: Physical Medicine & Rehabilitation | Admitting: Physical Medicine & Rehabilitation

## 2015-08-02 ENCOUNTER — Encounter: Payer: Self-pay | Admitting: Physical Medicine & Rehabilitation

## 2015-08-02 VITALS — BP 108/55 | HR 110 | Resp 16

## 2015-08-02 DIAGNOSIS — M1990 Unspecified osteoarthritis, unspecified site: Secondary | ICD-10-CM | POA: Diagnosis not present

## 2015-08-02 DIAGNOSIS — F0781 Postconcussional syndrome: Secondary | ICD-10-CM

## 2015-08-02 DIAGNOSIS — E119 Type 2 diabetes mellitus without complications: Secondary | ICD-10-CM | POA: Diagnosis not present

## 2015-08-02 DIAGNOSIS — G894 Chronic pain syndrome: Secondary | ICD-10-CM | POA: Insufficient documentation

## 2015-08-02 DIAGNOSIS — S12500A Unspecified displaced fracture of sixth cervical vertebra, initial encounter for closed fracture: Secondary | ICD-10-CM

## 2015-08-02 DIAGNOSIS — S5401XS Injury of ulnar nerve at forearm level, right arm, sequela: Secondary | ICD-10-CM | POA: Diagnosis not present

## 2015-08-02 DIAGNOSIS — R51 Headache: Secondary | ICD-10-CM | POA: Diagnosis not present

## 2015-08-02 DIAGNOSIS — S4992XA Unspecified injury of left shoulder and upper arm, initial encounter: Secondary | ICD-10-CM | POA: Insufficient documentation

## 2015-08-02 DIAGNOSIS — I10 Essential (primary) hypertension: Secondary | ICD-10-CM | POA: Insufficient documentation

## 2015-08-02 DIAGNOSIS — M25512 Pain in left shoulder: Secondary | ICD-10-CM | POA: Diagnosis not present

## 2015-08-02 DIAGNOSIS — M25522 Pain in left elbow: Secondary | ICD-10-CM | POA: Insufficient documentation

## 2015-08-02 DIAGNOSIS — G4700 Insomnia, unspecified: Secondary | ICD-10-CM | POA: Diagnosis not present

## 2015-08-02 DIAGNOSIS — R4189 Other symptoms and signs involving cognitive functions and awareness: Secondary | ICD-10-CM | POA: Diagnosis present

## 2015-08-02 DIAGNOSIS — R202 Paresthesia of skin: Secondary | ICD-10-CM | POA: Diagnosis not present

## 2015-08-02 DIAGNOSIS — F4310 Post-traumatic stress disorder, unspecified: Secondary | ICD-10-CM | POA: Diagnosis present
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
## 2015-08-03 ENCOUNTER — Ambulatory Visit: Payer: BC Managed Care – PPO | Admitting: Psychology

## 2015-08-04 ENCOUNTER — Ambulatory Visit: Payer: BC Managed Care – PPO | Attending: Psychology | Admitting: Psychology

## 2015-08-04 ENCOUNTER — Encounter: Payer: Self-pay | Admitting: Psychology

## 2015-08-04 DIAGNOSIS — F4310 Post-traumatic stress disorder, unspecified: Secondary | ICD-10-CM | POA: Diagnosis not present

## 2015-08-04 NOTE — Progress Notes (Addendum)
[facility]  [address]   Telephone [phone] [address] Fax [phone] [place], [place] [address]   Psychology Progress Note   Name:  Gene Weeks Date of Birth:  11-03-1975 MRN:  [id number]  Date:08/04/2015 (15m psychotherapy He expressed worry mostly about his orthopedic injuries today. He reported experiencing increased neck muscle spasms and a having a greater awareness of how weak his neck muscles have become since he was approved to wear a soft cervical collar last week. He reported no longer taking opioid pain medications but still relying on muscle relaxers. He reported feeling less anxious in general though still anxious about being a passenger in a car, especially in rainy weather. He has been successfully fighting his inclination to avoid being in a car though it has been tough for him to do do when it is raining. No new stressors reported.    Diagnostic Impression Post traumatic stress [F43.10]  Developed with him a set of conditions that only if met will he allow himself to avoid being a passenger in a car during inclement weather. Conditions were bald/balding tires, driver unusually young or old, driver texting or using cell phone.   Return on 08/15/15.   MJamey Ripa Ph.D Licensed Psychologist

## 2015-08-15 ENCOUNTER — Ambulatory Visit (INDEPENDENT_AMBULATORY_CARE_PROVIDER_SITE_OTHER): Payer: BC Managed Care – PPO | Admitting: Psychology

## 2015-08-15 ENCOUNTER — Encounter: Payer: Self-pay | Admitting: Psychology

## 2015-08-15 DIAGNOSIS — F4310 Post-traumatic stress disorder, unspecified: Secondary | ICD-10-CM | POA: Diagnosis not present

## 2015-08-15 NOTE — Progress Notes (Signed)
[facility]  [address]   Telephone [phone] [address] Fax [phone] [place], [place] [address]   Psychology Progress Note   Name:  Gene Weeks Date of Birth:  1975-10-11 MRN:  [id number]  Date:08/15/2015 (46m) psychotherapy [name] reported that he was able to ride with a friend while it was raining. He used the checklist we developed last session. He stated that he was very anxious during the trip, less so on the return home drive, but completed it. He was compelled to take this trip by his need to get his diabetes medication.   He reported that his neck spasms have reduced in intensity. Lack of income continues to be a source of stress.     Diagnostic Impression Post traumatic stress disorder [F43.10]  Making steady progress. Encouraged him to seek out the opportunity to ride in a car during the rain without having a pressing reason to do so.   Return on 08/28/15.   Gladstone Pih, Ph.D Licensed Psychologist

## 2015-08-16 ENCOUNTER — Other Ambulatory Visit: Payer: Self-pay | Admitting: Internal Medicine

## 2015-08-17 ENCOUNTER — Telehealth: Payer: Self-pay | Admitting: Internal Medicine

## 2015-08-17 NOTE — Telephone Encounter (Signed)
Pt presented to the office and was given samples of pen needles for his insulin pen.

## 2015-08-17 NOTE — Telephone Encounter (Signed)
Pt was in [place] in [place] and no longer has a job. Pt would like samples of needles for novolog flex pens

## 2015-08-25 ENCOUNTER — Encounter: Payer: Self-pay | Admitting: Internal Medicine

## 2015-08-25 ENCOUNTER — Ambulatory Visit (INDEPENDENT_AMBULATORY_CARE_PROVIDER_SITE_OTHER): Payer: BC Managed Care – PPO | Admitting: Internal Medicine

## 2015-08-25 VITALS — BP 112/72 | HR 120 | Temp 98.7°F | Resp 18 | Ht 77.0 in | Wt 266.0 lb

## 2015-08-25 DIAGNOSIS — I10 Essential (primary) hypertension: Secondary | ICD-10-CM | POA: Diagnosis not present

## 2015-08-25 DIAGNOSIS — M7989 Other specified soft tissue disorders: Secondary | ICD-10-CM

## 2015-08-25 DIAGNOSIS — F0781 Postconcussional syndrome: Secondary | ICD-10-CM

## 2015-08-25 DIAGNOSIS — E1159 Type 2 diabetes mellitus with other circulatory complications: Secondary | ICD-10-CM
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
## 2015-08-25 NOTE — Patient Instructions (Signed)
Xarelto 15 mg twice daily  Doppler ultrasound  left leg- discontinue xarelto if this study is negative

## 2015-08-28 ENCOUNTER — Ambulatory Visit: Payer: BC Managed Care – PPO | Attending: Psychology | Admitting: Psychology

## 2015-08-28 ENCOUNTER — Telehealth: Payer: Self-pay | Admitting: Internal Medicine

## 2015-08-28 ENCOUNTER — Ambulatory Visit (HOSPITAL_COMMUNITY)
Admission: RE | Admit: 2015-08-28 | Discharge: 2015-08-28 | Disposition: A | Discharge status: Home / Self Care | Payer: BC Managed Care – PPO | Source: Ambulatory Visit | Attending: Internal Medicine | Admitting: Internal Medicine

## 2015-08-28 ENCOUNTER — Other Ambulatory Visit: Payer: Self-pay | Admitting: Internal Medicine

## 2015-08-28 DIAGNOSIS — F4310 Post-traumatic stress disorder, unspecified: Secondary | ICD-10-CM | POA: Insufficient documentation

## 2015-08-28 DIAGNOSIS — R936 Abnormal findings on diagnostic imaging of limbs: Secondary | ICD-10-CM | POA: Diagnosis not present

## 2015-08-28 DIAGNOSIS — M7989 Other specified soft tissue disorders: Secondary | ICD-10-CM

## 2015-08-28 NOTE — Progress Notes (Signed)
VASCULAR LAB PRELIMINARY  PRELIMINARY  PRELIMINARY  PRELIMINARY  Bilateral lower extremity venous duplex completed.    Preliminary report:  Right:  No evidence of DVT, superficial thrombosis, or Baker's cyst. Left ; Acute DVT noted  coursing from the posterior tibial vein through the popliteal, femoral and into the distal common femoral veins.  No evidence of superficial thrombosis.  No Baker's cyst.  Neela Zecca, RVS 08/28/2015, 3:08 PM

## 2015-08-28 NOTE — Telephone Encounter (Signed)
Pt had ultrasound and per pt dr Kirtland Bouchard told him to follow up with him on wed. Dr Kirtland Bouchard does not have available appt. Can I create slot? Pt has been sch for friday
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
## 2015-08-30 NOTE — Telephone Encounter (Signed)
Pt states he has enough medication for today and Thursday but not for Friday appt is @ 4:15

## 2015-08-30 NOTE — Telephone Encounter (Signed)
Pt returned your call.  

## 2015-08-30 NOTE — Telephone Encounter (Signed)
Dr.K, wants to see him today. Please call and add to the schedule if pt can if not keep Friday.

## 2015-08-31 NOTE — Telephone Encounter (Signed)
Spoke to pt, asked him if he has enough Xarelto for tomorrow until he sees Dr.K? Pt said yes, has enough for tomorrow morning. Told him okay good, see you tomorrow. Pt verbalized understanding.

## 2015-09-01 ENCOUNTER — Ambulatory Visit (INDEPENDENT_AMBULATORY_CARE_PROVIDER_SITE_OTHER): Payer: BC Managed Care – PPO | Admitting: Internal Medicine

## 2015-09-01 ENCOUNTER — Encounter: Payer: Self-pay | Admitting: Internal Medicine

## 2015-09-01 VITALS — BP 90/60 | HR 110 | Temp 98.3°F | Resp 20 | Ht 77.0 in | Wt 275.0 lb

## 2015-09-01 DIAGNOSIS — I10 Essential (primary) hypertension: Secondary | ICD-10-CM | POA: Diagnosis not present

## 2015-09-01 DIAGNOSIS — I82402 Acute embolism and thrombosis of unspecified deep veins of left lower extremity: Secondary | ICD-10-CM

## 2015-09-01 DIAGNOSIS — E1159 Type 2 diabetes mellitus with other circulatory complications: Secondary | ICD-10-CM | POA: Diagnosis not present

## 2015-09-01 DIAGNOSIS — Z86718 Personal history of other venous thrombosis and embolism: Secondary | ICD-10-CM | POA: Insufficient documentation

## 2015-09-01 MED ORDER — RIVAROXABAN 20 MG PO TABS: 20.0000 mg | ORAL_TABLET | Freq: Every day | ORAL | Status: DC
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
## 2015-09-01 NOTE — Progress Notes (Signed)
Pre visit review using our clinic review tool, if applicable. No additional management support is needed unless otherwise documented below in the visit note. 

## 2015-09-01 NOTE — Patient Instructions (Addendum)
Deep Vein Thrombosis A deep vein thrombosis (DVT) is a blood clot (thrombus) that usually occurs in a deep, larger vein of the lower leg or the pelvis, or in an upper extremity such as the arm. These are dangerous and can lead to serious and even life-threatening complications if the clot travels to the lungs. A DVT can damage the valves in your leg veins so that instead of flowing upward, the blood pools in the lower leg. This is called post-thrombotic syndrome, and it can result in pain, swelling, discoloration, and sores on the leg. CAUSES A DVT is caused by the formation of a blood clot in your leg, pelvis, or arm. Usually, several things contribute to the formation of blood clots. A clot may develop when:  Your blood flow slows down.  Your vein becomes damaged in some way.  You have a condition that makes your blood clot more easily. RISK FACTORS A DVT is more likely to develop in:  People who are older, especially over 57 years of age.  People who are overweight (obese).  People who sit or lie still for a long time, such as during long-distance travel (over 4 hours), bed rest, hospitalization, or during recovery from certain medical conditions like a stroke.  People who do not engage in much physical activity (sedentary lifestyle).  People who have chronic breathing disorders.  People who have a personal or family history of blood clots or blood clotting disease.  People who have peripheral vascular disease (PVD), diabetes, or some types of cancer.  People who have heart disease, especially if the person had a recent heart attack or has congestive heart failure.  People who have neurological diseases that affect the legs (leg paresis).  People who have had a traumatic injury, such as breaking a hip or leg.  People who have recently had major or lengthy surgery, especially on the hip, knee, or abdomen.  People who have had a central line placed inside a large vein.  People  who take medicines that contain the hormone estrogen. These include birth control pills and hormone replacement therapy.  Pregnancy or during childbirth or the postpartum period.  Long plane flights (over 8 hours). SIGNS AND SYMPTOMS Symptoms of a DVT can include:   Swelling of your leg or arm, especially if one side is much worse.  Warmth and redness of your leg or arm, especially if one side is much worse.  Pain in your arm or leg. If the clot is in your leg, symptoms may be more noticeable or worse when you stand or walk.  A feeling of pins and needles, if the clot is in the arm. The symptoms of a DVT that has traveled to the lungs (pulmonary embolism, PE) usually start suddenly and include:  Shortness of breath while active or at rest.  Coughing or coughing up blood or blood-tinged mucus.  Chest pain that is often worse with deep breaths.  Rapid or irregular heartbeat.  Feeling light-headed or dizzy.  Fainting.  Feeling anxious.  Sweating. There may also be pain and swelling in a leg if that is where the blood clot started. These symptoms may represent a serious problem that is an emergency. Do not wait to see if the symptoms will go away. Get medical help right away. Call your local emergency services (911 in the [place] Do not drive yourself to the hospital. DIAGNOSIS Your health care provider will take a medical history and perform a physical exam. You may also  have other tests, including:  Blood tests to assess the clotting properties of your blood.  Imaging tests, such as CT, ultrasound, MRI, X-ray, and other tests to see if you have clots anywhere in your body. TREATMENT After a DVT is identified, it can be treated. The type of treatment that you receive depends on many factors, such as the cause of your DVT, your risk for bleeding or developing more clots, and other medical conditions that you have. Sometimes, a combination of treatments is necessary. Treatment  options may be combined and include:  Monitoring the blood clot with ultrasound.  Taking medicines by mouth, such as newer blood thinners (anticoagulants), thrombolytics, or warfarin.  Taking anticoagulant medicine by injection or through an IV tube.  Wearing compression stockings or using different types ofdevices.  Surgery (rare) to remove the blood clot or to place a filter in your abdomen to stop the blood clot from traveling to your lungs. Treatments for a DVT are often divided into immediate treatment and long-term treatment (up to 3 months after DVT). You can work with your health care provider to choose the treatment program that is best for you. HOME CARE INSTRUCTIONS If you are taking a newer oral anticoagulant:  Take the medicine every single day at the same time each day.  Understand what foods and drugs interact with this medicine.  Understand that there are no regular blood tests required when using this medicine.  Understand the side effects of this medicine, including excessive bruising or bleeding. Ask your health care provider or pharmacist about other possible side effects. If you are taking warfarin:  Understand how to take warfarin and know which foods can affect how warfarin works in Veterinary surgeon.  Understand that it is dangerous to take too much or too little warfarin. Too much warfarin increases the risk of bleeding. Too little warfarin continues to allow the risk for blood clots.  Follow your PT and INR blood testing schedule. The PT and INR results allow your health care provider to adjust your dose of warfarin. It is very important that you have your PT and INR tested as often as told by your health care provider.  Avoid major changes in your diet, or tell your health care provider before you change your diet. Arrange a visit with a registered dietitian to answer your questions. Many foods, especially foods that are high in vitamin K, can interfere with warfarin  and affect the PT and INR results. Eat a consistent amount of foods that are high in vitamin K, such as:  Spinach, kale, broccoli, cabbage, collard greens, turnip greens, Brussels sprouts, peas, cauliflower, seaweed, and parsley.  Beef liver and pork liver.  Green tea.  Soybean oil.  Tell your health care provider about any and all medicines, vitamins, and supplements that you take, including aspirin and other over-the-counter anti-inflammatory medicines. Be especially cautious with aspirin and anti-inflammatory medicines. Do not take those before you ask your health care provider if it is safe to do so. This is important because many medicines can interfere with warfarin and affect the PT and INR results.  Do not start or stop taking any over-the-counter or prescription medicine unless your health care provider or pharmacist tells you to do so. If you take warfarin, you will also need to do these things:  Hold pressure over cuts for longer than usual.  Tell your dentist and other health care providers that you are taking warfarin before you have any procedures in which  bleeding may occur.  Avoid alcohol or drink very small amounts. Tell your health care provider if you change your alcohol intake.  Do not use tobacco products, including cigarettes, chewing tobacco, and e-cigarettes. If you need help quitting, ask your health care provider.  Avoid contact sports. General Instructions  Take over-the-counter and prescription medicines only as told by your health care provider. Anticoagulant medicines can have side effects, including easy bruising and difficulty stopping bleeding. If you are prescribed an anticoagulant, you will also need to do these things:  Hold pressure over cuts for longer than usual.  Tell your dentist and other health care providers that you are taking anticoagulants before you have any procedures in which bleeding may occur.  Avoid contact sports.  Wear a medical  alert bracelet or carry a medical alert card that says you have had a PE.  Ask your health care provider how soon you can go back to your normal activities. Stay active to prevent new blood clots from forming.  Make sure to exercise while traveling or when you have been sitting or standing for a long period of time. It is very important to exercise. Exercise your legs by walking or by tightening and relaxing your leg muscles often. Take frequent walks.  Wear compression stockings as told by your health care provider to help prevent more blood clots from forming.  Do not use tobacco products, including cigarettes, chewing tobacco, and e-cigarettes. If you need help quitting, ask your health care provider.  Keep all follow-up appointments with your health care provider. This is important. PREVENTION Take these actions to decrease your risk of developing another DVT:  Exercise regularly. For at least 30 minutes every day, engage in:  Activity that involves moving your arms and legs.  Activity that encourages good blood flow through your body by increasing your heart rate.  Exercise your arms and legs every hour during long-distance travel (over 4 hours). Drink plenty of water and avoid drinking alcohol while traveling.  Avoid sitting or lying in bed for long periods of time without moving your legs.  Maintain a weight that is appropriate for your height. Ask your health care provider what weight is healthy for you.  If you are a woman who is over 11 years of age, avoid unnecessary use of medicines that contain estrogen. These include birth control pills.  Do not smoke, especially if you take estrogen medicines. If you need help quitting, ask your health care provider. If you are hospitalized, prevention measures may include:  Early walking after surgery, as soon as your health care provider says that it is safe.  Receiving anticoagulants to prevent blood clots.If you cannot take  anticoagulants, other options may be available, such as wearing compression stockings or using different types of devices. SEEK IMMEDIATE MEDICAL CARE IF:  You have new or increased pain, swelling, or redness in an arm or leg.  You have numbness or tingling in an arm or leg.  You have shortness of breath while active or at rest.  You have chest pain.  You have a rapid or irregular heartbeat.  You feel light-headed or dizzy.  You cough up blood.  You notice blood in your vomit, bowel movement, or urine. These symptoms may represent a serious problem that is an emergency. Do not wait to see if the symptoms will go away. Get medical help right away. Call your local emergency services (911 in the [place] Do not drive yourself to the hospital.  This information is not intended to replace advice given to you by your health care provider. Make sure you discuss any questions you have with your health care provider.   Document Released: 02/11/2005 Document Revised: 11/02/2014 Document Reviewed: 06/08/2014 Elsevier Interactive Patient Education 2016 [employer]   Discontinue meloxicam, ibuprofen, or any other anti-inflammatory medications while on your anticoagulant

## 2015-09-11 ENCOUNTER — Ambulatory Visit (INDEPENDENT_AMBULATORY_CARE_PROVIDER_SITE_OTHER): Payer: BC Managed Care – PPO | Admitting: Psychology

## 2015-09-11 DIAGNOSIS — F4310 Post-traumatic stress disorder, unspecified: Secondary | ICD-10-CM
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
## 2015-09-21 ENCOUNTER — Other Ambulatory Visit: Payer: Self-pay | Admitting: Internal Medicine

## 2015-09-26 ENCOUNTER — Ambulatory Visit: Payer: BC Managed Care – PPO | Attending: Psychology | Admitting: Psychology

## 2015-09-26 DIAGNOSIS — F4310 Post-traumatic stress disorder, unspecified: Secondary | ICD-10-CM | POA: Diagnosis not present

## 2015-09-26 NOTE — Progress Notes (Signed)
[facility]  [address]   Telephone [phone] [address] Fax [phone] [place], [place] [address]   Psychology Progress Note   Name:  Gene Weeks Date of Birth:  [birth year] MRN:  [id number]  Date:09/26/2015 (75m) psychotherapy [name] reports ongoing gradual reduction in his level of anxiety and PTSD symptoms. He continues to be anxious and hypervigilant in a car. He expressed trepidation about resuming driving (he reported that last week he was given clearance from his neurosurgeon to drive again), in part due to reduced range of neck motion. Mood reported as mostly good.  He reported that his neck spasms and headache have much reduced. He no longer needs to wear a neck brace. He reported that he will need to have left shoulder surgery soon due to tears in his labrum and rotator cuff.   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  Diagnostic Impressions Posttraumatic stress disorder, resolving [F43.10]  Commended him on his decision to return to work as part of the process of normalization. He agreed with the goal of driving at least one way to his job each day using his mother's car.  I informed him that I will be leaving my position with [facility] as of 11/10/15. I offered the options of being referred to another provider, continuing treatment with me at my new practice location in [place], [place] or terminating psychological services by that date. We will discuss again next time.  Return in two weeks.   Gladstone Pih, Ph.D Licensed Psychologist

## 2015-10-09 ENCOUNTER — Encounter
Payer: BC Managed Care – PPO | Attending: Physical Medicine & Rehabilitation | Admitting: Physical Medicine & Rehabilitation

## 2015-10-09 ENCOUNTER — Encounter: Payer: Self-pay | Admitting: Physical Medicine & Rehabilitation

## 2015-10-09 VITALS — BP 124/88 | HR 109

## 2015-10-09 DIAGNOSIS — F0781 Postconcussional syndrome: Secondary | ICD-10-CM

## 2015-10-09 DIAGNOSIS — I10 Essential (primary) hypertension: Secondary | ICD-10-CM | POA: Diagnosis not present

## 2015-10-09 DIAGNOSIS — F4310 Post-traumatic stress disorder, unspecified: Secondary | ICD-10-CM | POA: Diagnosis present

## 2015-10-09 DIAGNOSIS — R202 Paresthesia of skin: Secondary | ICD-10-CM | POA: Insufficient documentation

## 2015-10-09 DIAGNOSIS — M1990 Unspecified osteoarthritis, unspecified site: Secondary | ICD-10-CM | POA: Diagnosis not present

## 2015-10-09 DIAGNOSIS — G894 Chronic pain syndrome: Secondary | ICD-10-CM | POA: Insufficient documentation

## 2015-10-09 DIAGNOSIS — S12500A Unspecified displaced fracture of sixth cervical vertebra, initial encounter for closed fracture: Secondary | ICD-10-CM | POA: Diagnosis not present

## 2015-10-09 DIAGNOSIS — M25512 Pain in left shoulder: Secondary | ICD-10-CM | POA: Diagnosis not present

## 2015-10-09 DIAGNOSIS — R51 Headache: Secondary | ICD-10-CM | POA: Insufficient documentation

## 2015-10-09 DIAGNOSIS — G4700 Insomnia, unspecified: Secondary | ICD-10-CM | POA: Insufficient documentation

## 2015-10-09 DIAGNOSIS — E119 Type 2 diabetes mellitus without complications: Secondary | ICD-10-CM | POA: Diagnosis not present

## 2015-10-09 DIAGNOSIS — R4189 Other symptoms and signs involving cognitive functions and awareness: Secondary | ICD-10-CM | POA: Insufficient documentation

## 2015-10-09 DIAGNOSIS — S4992XA Unspecified injury of left shoulder and upper arm, initial encounter: Secondary | ICD-10-CM | POA: Diagnosis not present

## 2015-10-09 DIAGNOSIS — M25522 Pain in left elbow: Secondary | ICD-10-CM | POA: Diagnosis not present

## 2015-10-09 NOTE — Patient Instructions (Signed)
PLEASE CALL ME WITH ANY PROBLEMS OR QUESTIONS ([phone])  
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
## 2015-10-10 ENCOUNTER — Telehealth: Payer: Self-pay | Admitting: Internal Medicine

## 2015-10-10 ENCOUNTER — Ambulatory Visit: Payer: BC Managed Care – PPO | Admitting: Psychology

## 2015-10-10 NOTE — Telephone Encounter (Signed)
Pt is needing a surgical clearance letter to have L shoulder surgery on 10/11/15.  [facility]  Phone [phone] Dr. Clare Charon. Gene Weeks  Fax: [phone]

## 2015-10-11 NOTE — Telephone Encounter (Signed)
Correction:   For the surgery clearance letter [facility] in [place]  Phone: [phone]   Fax: [phone]  Attn: Dr. Haze Justin Hayes/[facility] Scheduler.  Pt would like to have a call when this is better.

## 2015-10-11 NOTE — Telephone Encounter (Signed)
Patient is scheduled to see Dr. Kirtland Bouchard on 8-18 for surgical clearance

## 2015-10-13 ENCOUNTER — Encounter: Payer: Self-pay | Admitting: Internal Medicine

## 2015-10-13 ENCOUNTER — Ambulatory Visit (INDEPENDENT_AMBULATORY_CARE_PROVIDER_SITE_OTHER): Payer: BC Managed Care – PPO | Admitting: Psychology

## 2015-10-13 ENCOUNTER — Other Ambulatory Visit: Payer: Self-pay | Admitting: Internal Medicine

## 2015-10-13 ENCOUNTER — Ambulatory Visit (INDEPENDENT_AMBULATORY_CARE_PROVIDER_SITE_OTHER): Payer: BC Managed Care – PPO | Admitting: Internal Medicine

## 2015-10-13 VITALS — BP 120/80 | HR 80 | Temp 98.1°F | Ht 77.0 in | Wt 282.0 lb

## 2015-10-13 DIAGNOSIS — I10 Essential (primary) hypertension: Secondary | ICD-10-CM

## 2015-10-13 DIAGNOSIS — E1159 Type 2 diabetes mellitus with other circulatory complications: Secondary | ICD-10-CM | POA: Diagnosis not present

## 2015-10-13 DIAGNOSIS — F4310 Post-traumatic stress disorder, unspecified: Secondary | ICD-10-CM

## 2015-10-13 DIAGNOSIS — I82402 Acute embolism and thrombosis of unspecified deep veins of left lower extremity: Secondary | ICD-10-CM

## 2015-10-13 LAB — LIPID PANEL
Cholesterol: 173 mg/dL (ref 0–200)
HDL: 62.1 mg/dL (ref >39.00)
LDL Cholesterol: 96 mg/dL (ref 0–99)
NonHDL: 111.31
TRIGLYCERIDES: 75 mg/dL (ref 0.0–149.0)
Total CHOL/HDL Ratio: 3
VLDL: 15 mg/dL (ref 0.0–40.0)

## 2015-10-13 LAB — MICROALBUMIN / CREATININE URINE RATIO
CREATININE, U: 123.3 mg/dL
MICROALB/CREAT RATIO: 0.6 mg/g (ref 0.0–30.0)
Microalb, Ur: <0.7 mg/dL (ref 0.0–1.9)

## 2015-10-13 LAB — HEMOGLOBIN A1C: Hgb A1c MFr Bld: 8.7 % — ABNORMAL HIGH (ref 4.6–6.5)

## 2015-10-13 NOTE — Patient Instructions (Signed)
Please schedule endocrinology follow-up  Limit your sodium (Salt) intake    It is important that you exercise regularly, at least 20 minutes 3 to 4 times per week.  If you develop chest pain or shortness of breath seek  medical attention.   Please check your hemoglobin A1c every 3 months

## 2015-10-13 NOTE — Progress Notes (Signed)
Subjective:    Patient ID: Gene Weeks, male    DOB: 05-24-1975, 40 y.o.   MRN: [id number]  HPI  40 year old patient who is seen today for preoperative clearance.  He is scheduled for left rotator cuff arthroscopic surgery. He has been treated for left leg DVT diagnosed July 3.  He has been on Xarelto  Anticoagulation. He has type 2 diabetes.  He has been on mealtime insulin as well as multiple oral medications.  Last hemoglobin A1c 8.1.  No recent endocrine follow-up He has essential hypertension  Past Medical History:  Diagnosis Date  . DIABETES MELLITUS, TYPE II 04/08/2008  . DVT (deep venous thrombosis) (HCC)    left leg  . HYPERTENSION 04/08/2008  . OSTEOARTHRITIS 04/08/2008     Social History   Social History  . Marital status: Single    Spouse name: N/A  . Number of children: N/A  . Years of education: N/A   Occupational History  . Not on file.   Social History Main Topics  . Smoking status: Never Smoker  . Smokeless tobacco: Never Used  . Alcohol use No  . Drug use: No  . Sexual activity: Not on file   Other Topics Concern  . Not on file   Social History Narrative  . No narrative on file    Past Surgical History:  Procedure Laterality Date  . KNEE ARTHROSCOPY     left    Family History  Problem Relation Age of Onset  . Cancer Mother     colon ca  . Hypothyroidism Mother   . Hyperlipidemia Father   . Diabetes Cousin     Allergies  Allergen Reactions  . Amoxicillin     Current Outpatient Prescriptions on File Prior to Visit  Medication Sig Dispense Refill  . atorvastatin (LIPITOR) 20 MG tablet Take 1 tablet (20 mg total) by mouth daily. 90 tablet 3  . carisoprodol (SOMA) 350 MG tablet Take 1 tablet by mouth 3 (three) times daily as needed.  0  . dapagliflozin propanediol (FARXIGA) 10 MG TABS tablet Take 10 mg by mouth daily. 30 tablet 11  . escitalopram (LEXAPRO) 10 MG tablet Take 1 tablet (10 mg total) by mouth daily. 30 tablet 3  .  glipiZIDE (GLUCOTROL XL) 10 MG 24 hr tablet Take 1 tablet (10 mg total) by mouth daily. 90 tablet 3  . glucose blood (ONETOUCH VERIO) test strip USE TO CHECK BLOOD SUGAR THREE TIMES A DAY BEFORE MEALS AND PRN 100 each 12  . Insulin Aspart (NOVOLOG FLEXPEN [place]) Inject 8-18 Units into the skin daily.    Marland Kitchen lisinopril (PRINIVIL,ZESTRIL) 20 MG tablet TAKE 1 TABLET BY MOUTH EVERY DAY 90 tablet 0  . metFORMIN (GLUCOPHAGE-XR) 500 MG 24 hr tablet Take 2 tablets (1,000 mg total) by mouth 2 (two) times daily. 180 tablet 3  . NOVOFINE 32G X 6 MM MISC TEST ONCE DAILY AS NEEDED 100 each 4  . NOVOLOG FLEXPEN 100 UNIT/ML FlexPen BS<70=0 UNITS, BS 71-140=8 UNITS, 141-200=12 UNITS,>200=16 UNITS 15 mL 0  . ONE TOUCH LANCETS MISC USE TO CHECK BLOOD SUGAR THREE TIMES A BEFORE MEALS AND PRN 100 each 12  . rivaroxaban (XARELTO) 20 MG TABS tablet Take 1 tablet (20 mg total) by mouth daily with supper. 60 tablet 0  . sildenafil (VIAGRA) 100 MG tablet Take 0.5-1 tablets (50-100 mg total) by mouth daily as needed for erectile dysfunction. 3 tablet 11  . topiramate (TOPAMAX) 25 MG tablet Take 1-2 tablets (25-50  mg total) by mouth at bedtime. 60 tablet 2  . traMADol (ULTRAM) 50 MG tablet Take 1 tablet (50 mg total) by mouth every 8 (eight) hours as needed. 90 tablet 2   No current facility-administered medications on file prior to visit.     BP 120/80 (BP Location: Right Arm, Patient Position: Sitting, Cuff Size: Large)   Pulse 80   Temp 98.1 F (36.7 C) (Oral)   Ht 6\' 5"  (1.956 m)   Wt 282 lb (127.9 kg)   SpO2 98%   BMI 33.44 kg/m     Review of Systems  Constitutional: Negative for appetite change, chills, fatigue and fever.  HENT: Negative for congestion, dental problem, ear pain, hearing loss, sore throat, tinnitus, trouble swallowing and voice change.   Eyes: Negative for pain, discharge and visual disturbance.  Respiratory: Negative for cough, chest tightness, wheezing and stridor.   Cardiovascular: Negative  for chest pain, palpitations and leg swelling.  Gastrointestinal: Negative for abdominal distention, abdominal pain, blood in stool, constipation, diarrhea, nausea and vomiting.  Genitourinary: Negative for difficulty urinating, discharge, flank pain, genital sores, hematuria and urgency.  Musculoskeletal: Positive for arthralgias and back pain. Negative for gait problem, joint swelling, myalgias and neck stiffness.  Skin: Negative for rash.  Neurological: Negative for dizziness, syncope, speech difficulty, weakness, numbness and headaches.  Hematological: Negative for adenopathy. Does not bruise/bleed easily.  Psychiatric/Behavioral: Negative for behavioral problems and dysphoric mood. The patient is not nervous/anxious.        Objective:   Physical Exam  Constitutional: He is oriented to person, place, and time. He appears well-developed.  Blood pressure 120/80  HENT:  Head: Normocephalic.  Right Ear: External ear normal.  Left Ear: External ear normal.  Eyes: Conjunctivae and EOM are normal.  Neck: Normal range of motion.  Cardiovascular: Normal rate and normal heart sounds.   Pulmonary/Chest: Breath sounds normal.  Abdominal: Bowel sounds are normal.  Musculoskeletal: Normal range of motion. He exhibits no edema or tenderness.  Neurological: He is alert and oriented to person, place, and time.  Psychiatric: He has a normal mood and affect. His behavior is normal.          Assessment & Plan:   Essential hypertension, stable Diabetes mellitus.  Will check a hemoglobin A1c Left leg DVT.  Presurgical clearance.  Letter faxed on his behalf to withhold anticoagulation 1 day preoperatively and resume immediately in the postop period  Rogelia BogaKWIATKOWSKI,PETER FRANK, MD
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
## 2015-10-17 ENCOUNTER — Encounter: Payer: Self-pay | Admitting: *Deleted

## 2015-10-17 ENCOUNTER — Telehealth: Payer: Self-pay | Admitting: Internal Medicine

## 2015-10-17 NOTE — Telephone Encounter (Signed)
° °  Pt call to ask if his letter for surgical clearance was sent to his surgeon Dr Madilyn Fireman at [facility]

## 2015-10-17 NOTE — Telephone Encounter (Signed)
Contacted Rita and she infromed me that she needed documentation of Gene Weeks surgical clearance to provide the anesthesiologist with. She informed me that no note was not received via fax. I asked her would the Dr's notes be valid enough or would she like to fax over a from an I would have another provider review her request and Dr.K's notes. [place] Sink informed me that the notes should be valid enough and if anything else was needed she would reach out to the office.  Dr.notes was Faxed to [employer] number was provided for any further request

## 2015-10-17 NOTE — Telephone Encounter (Signed)
Delta Sink from [facility] called also regarding Mr. Bastyr' surgical clearance. She's wondering if it can be faxed to her today since his surgery is tomorrow. Please give her a phone call if needed.  Rita's ph# [phone] Rita's fax# [phone]  Thank you.

## 2015-10-31 ENCOUNTER — Ambulatory Visit: Payer: BC Managed Care – PPO | Admitting: Psychology

## 2015-11-01 ENCOUNTER — Ambulatory Visit: Payer: BC Managed Care – PPO | Attending: Psychology | Admitting: Psychology

## 2015-11-01 DIAGNOSIS — F4310 Post-traumatic stress disorder, unspecified: Secondary | ICD-10-CM | POA: Insufficient documentation

## 2015-11-01 NOTE — Progress Notes (Addendum)
[facility]  [address]   Telephone [phone] [address] Fax [phone] [place], [place] [address]   Psychology Progress Note   Name:  Gene Weeks Date of Birth:  April 07, 1975 MRN:  [id number]  Date:11/01/2015 (26m psychotherapy Final session.  RDelsa [name] that he has been driving without limitations or excessive anxiety since our last session. 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  He is recuperating from left rotator cuff shoulder surgery last week.   No new complaints or problems reported.  Diagnostic Impression Post traumatic stress disorder, resolved [F43.10]  We agreed that al goals of psychological treatment have been met. No further treatment services are planned at this time. I gave him the phone number of my new practice to contact me as needed.    MJamey Ripa Ph.D Licensed Psychologist

## 2015-11-03 ENCOUNTER — Encounter: Payer: Self-pay | Admitting: Internal Medicine

## 2015-11-03 ENCOUNTER — Ambulatory Visit (INDEPENDENT_AMBULATORY_CARE_PROVIDER_SITE_OTHER): Payer: BC Managed Care – PPO | Admitting: Internal Medicine

## 2015-11-03 VITALS — BP 110/70 | HR 105 | Temp 98.2°F | Resp 20 | Ht 77.0 in | Wt 280.0 lb

## 2015-11-03 DIAGNOSIS — E1159 Type 2 diabetes mellitus with other circulatory complications: Secondary | ICD-10-CM

## 2015-11-03 DIAGNOSIS — I10 Essential (primary) hypertension: Secondary | ICD-10-CM | POA: Diagnosis not present

## 2015-11-03 NOTE — Progress Notes (Signed)
Pre visit review using our clinic review tool, if applicable. No additional management support is needed unless otherwise documented below in the visit note. 

## 2015-11-03 NOTE — Progress Notes (Signed)
Subjective:    Patient ID: Gene Weeks, male    DOB: 1975/12/04, 40 y.o.   MRN: [id number]  HPI 40 year old patient who is seen today for follow-up. He has diabetes which has not been well controlled.  He has been seen by endocrine, but not recently.  Basal insulin was discontinued and now is back on S U therapy.  He remains on mealtime insulin sliding scale .  Fasting blood sugar earlier today 160  .  He has recently had left arthroscopic shoulder surgery.  Plans return to work next week.  Requesting a letter for light duty  .  He has essential hypertension which has been stable  Past Medical History:  Diagnosis Date  . DIABETES MELLITUS, TYPE II 04/08/2008  . DVT (deep venous thrombosis) (HCC)    left leg  . HYPERTENSION 04/08/2008  . OSTEOARTHRITIS 04/08/2008     Social History   Social History  . Marital status: Single    Spouse name: N/A  . Number of children: N/A  . Years of education: N/A   Occupational History  . Not on file.   Social History Main Topics  . Smoking status: Never Smoker  . Smokeless tobacco: Never Used  . Alcohol use No  . Drug use: No  . Sexual activity: Not on file   Other Topics Concern  . Not on file   Social History Narrative  . No narrative on file    Past Surgical History:  Procedure Laterality Date  . KNEE ARTHROSCOPY     left    Family History  Problem Relation Age of Onset  . Cancer Mother     colon ca  . Hypothyroidism Mother   . Hyperlipidemia Father   . Diabetes Cousin     Allergies  Allergen Reactions  . Amoxicillin     Current Outpatient Prescriptions on File Prior to Visit  Medication Sig Dispense Refill  . dapagliflozin propanediol (FARXIGA) 10 MG TABS tablet Take 10 mg by mouth daily. 30 tablet 11  . glipiZIDE (GLUCOTROL XL) 10 MG 24 hr tablet Take 1 tablet (10 mg total) by mouth daily. 90 tablet 3  . glucose blood (ONETOUCH VERIO) test strip USE TO CHECK BLOOD SUGAR THREE TIMES A DAY BEFORE MEALS AND  PRN 100 each 12  . Insulin Aspart (NOVOLOG FLEXPEN [employer]) Inject 8-18 Units into the skin daily.    Marland Kitchen. lisinopril (PRINIVIL,ZESTRIL) 20 MG tablet TAKE 1 TABLET BY MOUTH EVERY DAY 90 tablet 0  . metFORMIN (GLUCOPHAGE-XR) 500 MG 24 hr tablet Take 2 tablets (1,000 mg total) by mouth 2 (two) times daily. 180 tablet 3  . NOVOFINE 32G X 6 MM MISC TEST ONCE DAILY AS NEEDED 100 each 4  . NOVOLOG FLEXPEN 100 UNIT/ML FlexPen BS<70=0 UNITS, BS 71-140=8 UNITS, 141-200=12 UNITS,>200=16 UNITS 15 mL 0  . ONE TOUCH LANCETS MISC USE TO CHECK BLOOD SUGAR THREE TIMES A BEFORE MEALS AND PRN 100 each 12  . rivaroxaban (XARELTO) 20 MG TABS tablet Take 1 tablet (20 mg total) by mouth daily with supper. 60 tablet 0  . sildenafil (VIAGRA) 100 MG tablet Take 0.5-1 tablets (50-100 mg total) by mouth daily as needed for erectile dysfunction. 3 tablet 11  . traMADol (ULTRAM) 50 MG tablet Take 1 tablet (50 mg total) by mouth every 8 (eight) hours as needed. 90 tablet 2  . atorvastatin (LIPITOR) 20 MG tablet Take 1 tablet (20 mg total) by mouth daily. (Patient not taking: Reported on 11/03/2015) 90 tablet  3   No current facility-administered medications on file prior to visit.     BP 110/70 (BP Location: Left Arm, Patient Position: Sitting, Cuff Size: Large)   Pulse (!) 105   Temp 98.2 F (36.8 C) (Oral)   Resp 20   Ht 6\' 5"  (1.956 m)   Wt 280 lb (127 kg)   SpO2 98%   BMI 33.20 kg/m      Review of Systems  Constitutional: Negative for appetite change, chills, fatigue and fever.  HENT: Negative for congestion, dental problem, ear pain, hearing loss, sore throat, tinnitus, trouble swallowing and voice change.   Eyes: Negative for pain, discharge and visual disturbance.  Respiratory: Negative for cough, chest tightness, wheezing and stridor.   Cardiovascular: Negative for chest pain, palpitations and leg swelling.  Gastrointestinal: Negative for abdominal distention, abdominal pain, blood in stool, constipation,  diarrhea, nausea and vomiting.  Genitourinary: Negative for difficulty urinating, discharge, flank pain, genital sores, hematuria and urgency.  Musculoskeletal: Positive for arthralgias, neck pain and neck stiffness. Negative for back pain, gait problem, joint swelling and myalgias.  Skin: Negative for rash.  Neurological: Negative for dizziness, syncope, speech difficulty, weakness, numbness and headaches.  Hematological: Negative for adenopathy. Does not bruise/bleed easily.  Psychiatric/Behavioral: Negative for behavioral problems and dysphoric mood. The patient is not nervous/anxious.        Objective:   Physical Exam  Constitutional: He appears well-developed and well-nourished. No distress.  Blood pressure 110/70  Cardiovascular: Intact distal pulses.   Pulmonary/Chest: Effort normal and breath sounds normal.  Musculoskeletal: He exhibits no edema.          Assessment & Plan:     Diabetes mellitus.  Samples dispensed.  Follow-up endocrinology  essential hypertension, stable .  Status post left shoulder arthroscopic surgery.  Letter dictated for light duty  .  Return here 3 months  KWIATKOWSKI,PETER Homero Fellers

## 2015-11-03 NOTE — Patient Instructions (Signed)
Please check your hemoglobin A1c every 3 months  Limit your sodium (Salt) intake   endocrinology follow-up

## 2015-11-14 ENCOUNTER — Encounter
Payer: BC Managed Care – PPO | Attending: Physical Medicine & Rehabilitation | Admitting: Physical Medicine & Rehabilitation

## 2015-11-14 ENCOUNTER — Encounter: Payer: Self-pay | Admitting: Physical Medicine & Rehabilitation

## 2015-11-14 VITALS — BP 135/90 | HR 93 | Resp 14

## 2015-11-14 DIAGNOSIS — S5401XS Injury of ulnar nerve at forearm level, right arm, sequela: Secondary | ICD-10-CM

## 2015-11-14 DIAGNOSIS — R202 Paresthesia of skin: Secondary | ICD-10-CM | POA: Insufficient documentation

## 2015-11-14 DIAGNOSIS — M25522 Pain in left elbow: Secondary | ICD-10-CM | POA: Diagnosis not present

## 2015-11-14 DIAGNOSIS — R4189 Other symptoms and signs involving cognitive functions and awareness: Secondary | ICD-10-CM | POA: Insufficient documentation

## 2015-11-14 DIAGNOSIS — R51 Headache: Secondary | ICD-10-CM | POA: Diagnosis not present

## 2015-11-14 DIAGNOSIS — G894 Chronic pain syndrome: Secondary | ICD-10-CM | POA: Insufficient documentation

## 2015-11-14 DIAGNOSIS — I10 Essential (primary) hypertension: Secondary | ICD-10-CM | POA: Insufficient documentation

## 2015-11-14 DIAGNOSIS — M1990 Unspecified osteoarthritis, unspecified site: Secondary | ICD-10-CM | POA: Diagnosis not present

## 2015-11-14 DIAGNOSIS — G4700 Insomnia, unspecified: Secondary | ICD-10-CM | POA: Diagnosis not present

## 2015-11-14 DIAGNOSIS — E119 Type 2 diabetes mellitus without complications: Secondary | ICD-10-CM | POA: Insufficient documentation

## 2015-11-14 DIAGNOSIS — S4992XA Unspecified injury of left shoulder and upper arm, initial encounter: Secondary | ICD-10-CM | POA: Insufficient documentation

## 2015-11-14 DIAGNOSIS — F4310 Post-traumatic stress disorder, unspecified: Secondary | ICD-10-CM | POA: Insufficient documentation

## 2015-11-14 DIAGNOSIS — M25512 Pain in left shoulder: Secondary | ICD-10-CM | POA: Insufficient documentation

## 2015-11-14 DIAGNOSIS — F0781 Postconcussional syndrome: Secondary | ICD-10-CM | POA: Insufficient documentation

## 2015-11-14 NOTE — Patient Instructions (Signed)
PLEASE CALL ME WITH ANY PROBLEMS OR QUESTIONS ([phone])  
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
## 2015-12-10 ENCOUNTER — Other Ambulatory Visit: Payer: Self-pay | Admitting: Endocrinology

## 2015-12-10 ENCOUNTER — Other Ambulatory Visit: Payer: Self-pay | Admitting: Internal Medicine

## 2015-12-10 NOTE — Telephone Encounter (Signed)
Please refill x 1 Ov is due  

## 2015-12-13 ENCOUNTER — Other Ambulatory Visit: Payer: Self-pay | Admitting: Internal Medicine

## 2016-01-01 ENCOUNTER — Other Ambulatory Visit: Payer: Self-pay | Admitting: Internal Medicine

## 2016-01-16 ENCOUNTER — Encounter: Payer: Self-pay | Admitting: Internal Medicine

## 2016-01-16 ENCOUNTER — Ambulatory Visit (INDEPENDENT_AMBULATORY_CARE_PROVIDER_SITE_OTHER): Payer: BC Managed Care – PPO | Admitting: Internal Medicine

## 2016-01-16 VITALS — BP 110/70 | HR 86 | Temp 98.5°F | Ht 77.0 in | Wt 290.8 lb

## 2016-01-16 DIAGNOSIS — I10 Essential (primary) hypertension: Secondary | ICD-10-CM | POA: Diagnosis not present

## 2016-01-16 DIAGNOSIS — E1159 Type 2 diabetes mellitus with other circulatory complications: Secondary | ICD-10-CM

## 2016-01-16 DIAGNOSIS — Z23 Encounter for immunization: Secondary | ICD-10-CM

## 2016-01-16 DIAGNOSIS — I82412 Acute embolism and thrombosis of left femoral vein: Secondary | ICD-10-CM

## 2016-01-16 LAB — HEMOGLOBIN A1C: Hgb A1c MFr Bld: 8.2 % — ABNORMAL HIGH (ref 4.6–6.5)

## 2016-01-16 NOTE — Patient Instructions (Signed)
Limit your sodium (Salt) intake  Please check your blood pressure on a regular basis.  If it is consistently greater than 150/90, please make an office appointment.   Please check your hemoglobin A1c every 3 months    It is important that you exercise regularly, at least 20 minutes 3 to 4 times per week.  If you develop chest pain or shortness of breath seek  medical attention.  You need to lose weight.  Consider a lower calorie diet and regular exercise. 
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
## 2016-01-16 NOTE — Progress Notes (Signed)
Pre visit review using our clinic review tool, if applicable. No additional management support is needed unless otherwise documented below in the visit note. 

## 2016-02-05 ENCOUNTER — Encounter
Payer: BC Managed Care – PPO | Attending: Physical Medicine & Rehabilitation | Admitting: Physical Medicine & Rehabilitation

## 2016-02-05 ENCOUNTER — Encounter: Payer: Self-pay | Admitting: Physical Medicine & Rehabilitation

## 2016-02-05 VITALS — BP 125/86 | HR 101

## 2016-02-05 DIAGNOSIS — F4310 Post-traumatic stress disorder, unspecified: Secondary | ICD-10-CM

## 2016-02-05 DIAGNOSIS — G4700 Insomnia, unspecified: Secondary | ICD-10-CM | POA: Insufficient documentation

## 2016-02-05 DIAGNOSIS — R202 Paresthesia of skin: Secondary | ICD-10-CM | POA: Insufficient documentation

## 2016-02-05 DIAGNOSIS — S12591S Other nondisplaced fracture of sixth cervical vertebra, sequela: Secondary | ICD-10-CM | POA: Diagnosis not present

## 2016-02-05 DIAGNOSIS — M1990 Unspecified osteoarthritis, unspecified site: Secondary | ICD-10-CM | POA: Diagnosis not present

## 2016-02-05 DIAGNOSIS — I10 Essential (primary) hypertension: Secondary | ICD-10-CM | POA: Insufficient documentation

## 2016-02-05 DIAGNOSIS — F0781 Postconcussional syndrome: Secondary | ICD-10-CM | POA: Diagnosis not present

## 2016-02-05 DIAGNOSIS — E119 Type 2 diabetes mellitus without complications: Secondary | ICD-10-CM | POA: Insufficient documentation

## 2016-02-05 DIAGNOSIS — R51 Headache: Secondary | ICD-10-CM | POA: Insufficient documentation

## 2016-02-05 DIAGNOSIS — M25522 Pain in left elbow: Secondary | ICD-10-CM | POA: Diagnosis not present

## 2016-02-05 DIAGNOSIS — S4992XA Unspecified injury of left shoulder and upper arm, initial encounter: Secondary | ICD-10-CM | POA: Diagnosis not present

## 2016-02-05 DIAGNOSIS — G894 Chronic pain syndrome: Secondary | ICD-10-CM | POA: Diagnosis not present

## 2016-02-05 DIAGNOSIS — R4189 Other symptoms and signs involving cognitive functions and awareness: Secondary | ICD-10-CM | POA: Insufficient documentation

## 2016-02-05 DIAGNOSIS — M25512 Pain in left shoulder: Secondary | ICD-10-CM | POA: Diagnosis not present

## 2016-02-05 MED ORDER — TRAMADOL HCL 50 MG PO TABS: 50.0000 mg | ORAL_TABLET | Freq: Three times a day (TID) | ORAL | 2 refills | Status: DC | PRN

## 2016-02-05 NOTE — Patient Instructions (Signed)
PLEASE CALL ME WITH ANY PROBLEMS OR QUESTIONS ([phone])   HAPPY HOLIDAYS!!!!                    *                * *             *   *   *         *  *   *  *  *     *  *  *  *  *  *  * *  *  *  *  *  *  *  *  *  * *               *  *               *  *               *  *  
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
## 2016-02-13 ENCOUNTER — Ambulatory Visit: Payer: BC Managed Care – PPO | Admitting: Physical Medicine & Rehabilitation

## 2016-02-16 ENCOUNTER — Other Ambulatory Visit: Payer: Self-pay | Admitting: Internal Medicine

## 2016-02-16 ENCOUNTER — Other Ambulatory Visit: Payer: Self-pay | Admitting: Endocrinology

## 2016-03-19 ENCOUNTER — Other Ambulatory Visit: Payer: Self-pay | Admitting: Internal Medicine

## 2016-04-08 ENCOUNTER — Other Ambulatory Visit: Payer: Self-pay | Admitting: Endocrinology

## 2016-04-21 ENCOUNTER — Other Ambulatory Visit: Payer: Self-pay | Admitting: Endocrinology

## 2016-04-22 ENCOUNTER — Other Ambulatory Visit: Payer: Self-pay | Admitting: Internal Medicine

## 2016-05-09 ENCOUNTER — Other Ambulatory Visit: Payer: Self-pay | Admitting: Endocrinology

## 2016-05-10 NOTE — Telephone Encounter (Signed)
Please refill x 1 Ov is due  

## 2016-05-24 ENCOUNTER — Other Ambulatory Visit: Payer: Self-pay | Admitting: Endocrinology

## 2016-05-24 NOTE — Telephone Encounter (Signed)
Please refill x 1 Ov is due  

## 2016-06-02 ENCOUNTER — Other Ambulatory Visit: Payer: Self-pay | Admitting: Internal Medicine

## 2016-06-12 ENCOUNTER — Other Ambulatory Visit: Payer: Self-pay | Admitting: Endocrinology

## 2016-06-13 NOTE — Telephone Encounter (Signed)
Refill has been submitted and patient has been sent a letter to come in for an office visit.

## 2016-06-13 NOTE — Telephone Encounter (Signed)
This patient was last seen on January 2017 by you. Is this Rx okay to refill? Or do they need a visit? Please advise.  Thank you! Alison Murray.

## 2016-06-13 NOTE — Telephone Encounter (Signed)
Please refill x 1 Ov is due  

## 2016-06-23 ENCOUNTER — Other Ambulatory Visit: Payer: Self-pay | Admitting: Endocrinology

## 2016-06-23 NOTE — Telephone Encounter (Signed)
Please refill x 1 Ov is due  

## 2016-06-28 ENCOUNTER — Other Ambulatory Visit: Payer: Self-pay | Admitting: Internal Medicine

## 2016-07-01 ENCOUNTER — Other Ambulatory Visit: Payer: Self-pay | Admitting: Internal Medicine

## 2016-07-31 ENCOUNTER — Other Ambulatory Visit: Payer: Self-pay | Admitting: Internal Medicine

## 2016-08-15 ENCOUNTER — Other Ambulatory Visit: Payer: Self-pay | Admitting: Endocrinology

## 2016-08-23 ENCOUNTER — Other Ambulatory Visit: Payer: Self-pay | Admitting: Endocrinology

## 2016-09-04 ENCOUNTER — Other Ambulatory Visit: Payer: Self-pay | Admitting: Endocrinology

## 2016-09-09 ENCOUNTER — Other Ambulatory Visit: Payer: Self-pay | Admitting: Internal Medicine

## 2016-09-12 ENCOUNTER — Telehealth: Payer: Self-pay | Admitting: Endocrinology

## 2016-09-12 ENCOUNTER — Other Ambulatory Visit: Payer: Self-pay

## 2016-09-12 ENCOUNTER — Other Ambulatory Visit: Payer: Self-pay | Admitting: Internal Medicine

## 2016-09-12 MED ORDER — DAPAGLIFLOZIN PROPANEDIOL 10 MG PO TABS: 10.0000 mg | ORAL_TABLET | Freq: Every day | ORAL | 1 refills | Status: DC

## 2016-09-12 MED ORDER — GLIPIZIDE ER 10 MG PO TB24: 10.0000 mg | ORAL_TABLET | Freq: Every day | ORAL | 1 refills | Status: DC

## 2016-09-12 NOTE — Telephone Encounter (Signed)
Patient need a refill of  FARXIGA 10 MG TABS tablet glipiZIDE (GLUCOTROL XL) 10 MG 24 hr tablet  Send to   [id number] - [place], [place] - [address] AT [facility] OF [facility] & [address] [phone] (Phone) [phone] (Fax)   Please call patient

## 2016-09-27 ENCOUNTER — Other Ambulatory Visit: Payer: Self-pay | Admitting: Internal Medicine

## 2016-10-09 ENCOUNTER — Other Ambulatory Visit: Payer: Self-pay | Admitting: Internal Medicine

## 2016-11-08 ENCOUNTER — Other Ambulatory Visit: Payer: Self-pay | Admitting: Internal Medicine

## 2016-11-10 ENCOUNTER — Other Ambulatory Visit: Payer: Self-pay | Admitting: Endocrinology

## 2016-11-10 NOTE — Telephone Encounter (Signed)
Please refill x 1 Ov is due  

## 2016-11-11 ENCOUNTER — Other Ambulatory Visit: Payer: Self-pay

## 2016-11-11 MED ORDER — DAPAGLIFLOZIN PROPANEDIOL 10 MG PO TABS: 10.0000 mg | ORAL_TABLET | Freq: Every day | ORAL | 1 refills | Status: DC

## 2016-11-15 ENCOUNTER — Ambulatory Visit: Payer: Self-pay

## 2016-11-15 ENCOUNTER — Other Ambulatory Visit: Payer: Self-pay | Admitting: Occupational Medicine

## 2016-11-15 DIAGNOSIS — M25562 Pain in left knee: Secondary | ICD-10-CM

## 2016-12-14 ENCOUNTER — Other Ambulatory Visit: Payer: Self-pay | Admitting: Internal Medicine

## 2016-12-26 ENCOUNTER — Other Ambulatory Visit: Payer: Self-pay | Admitting: Internal Medicine

## 2017-01-12 ENCOUNTER — Other Ambulatory Visit: Payer: Self-pay | Admitting: Endocrinology

## 2017-01-12 NOTE — Telephone Encounter (Signed)
Please refill x 1 Ov is due  

## 2017-01-13 ENCOUNTER — Other Ambulatory Visit: Payer: Self-pay

## 2017-01-13 MED ORDER — DAPAGLIFLOZIN PROPANEDIOL 10 MG PO TABS: 10.0000 mg | ORAL_TABLET | Freq: Every day | ORAL | 1 refills | Status: DC

## 2017-01-30 ENCOUNTER — Other Ambulatory Visit: Payer: Self-pay | Admitting: Internal Medicine

## 2017-02-11 ENCOUNTER — Other Ambulatory Visit: Payer: Self-pay | Admitting: Endocrinology

## 2017-02-11 ENCOUNTER — Telehealth: Payer: Self-pay | Admitting: Internal Medicine

## 2017-02-11 NOTE — Telephone Encounter (Signed)
Copied from CRM [id number]. Topic: Quick Communication - See Telephone Encounter >> Feb 11, 2017  3:46 PM Floria Raveling A wrote: CRM for notification. See Telephone encounter for: pt called in and said that he wanted to see DR K, He did not have anything until the 1st of the year.  Pt is completely out of his NOVOLOG FLEXPEN 100 UNIT/ML FlexPen [Pharmacy Med .  He would like to get a refill just enough to make it to his appt in jan?  Is he able to got that.  He is going out of town this weekend and will not have any meds?    02/11/17.

## 2017-02-12 ENCOUNTER — Other Ambulatory Visit: Payer: Self-pay | Admitting: Internal Medicine

## 2017-02-12 MED ORDER — INSULIN ASPART 100 UNIT/ML FLEXPEN: PEN_INJECTOR | SUBCUTANEOUS | 2 refills | Status: DC

## 2017-02-12 NOTE — Telephone Encounter (Deleted)
Copied from CRM [id number]. Topic: Quick Communication - Office Called Patient >> Feb 12, 2017  8:33 AM Neville Route, LPN wrote: Reason for CRM:  Please inform pt that, Yes we can refill medication to last pt until he can be seen by MD.  Called and left a detailed message on pt's voicemail.... Which pharmacy? 3 are listed in pt's chart.  [id number] [place], [place] - [address] [phone] (Phone) [phone] (Fax)  [facility] - [place],  - [phone] AT [facility] OF [facility] & [address] [phone] (Phone) [phone] Engineer, production)  [id number] - [place], [place] - [address] AT [facility] OF [facility] & [address] [phone] (Phone) [phone] (Fax)  >> Feb 12, 2017  8:36 AM Crist Infante wrote: [facility]/ [address]

## 2017-02-12 NOTE — Telephone Encounter (Signed)
NOVOLOG FLEXPEN 100 UNIT/ML FlexPen was e-scribed to the pharmacy.

## 2017-02-12 NOTE — Telephone Encounter (Signed)
Yes we can refill medication to last pt until he can be seen by MD.  Jeanene Erb and left a detailed message on pt's voicemail.... Which pharmacy? 3 are listed in pt's chart.  [id number] [place], [place] - [address] [phone] (Phone) [phone] (Fax)  [facility] - [place],  - [phone] AT [facility] OF [facility] & [address] [phone] (Phone) [phone] Engineer, production)  [id number] - [place], [place] - [address] AT [facility] OF [facility] & [address] [phone] (Phone) [phone] (Fax)

## 2017-02-12 NOTE — Telephone Encounter (Signed)
Pt states he uses only  [facility] [id number] - [place], [place] - [address] AT [facility] OF [facility] & [address] [phone] (Phone) [phone] (Fax)

## 2017-02-14 ENCOUNTER — Other Ambulatory Visit: Payer: Self-pay | Admitting: Internal Medicine

## 2017-03-05 ENCOUNTER — Ambulatory Visit: Payer: BC Managed Care – PPO | Admitting: Internal Medicine

## 2017-03-05 ENCOUNTER — Encounter: Payer: Self-pay | Admitting: Internal Medicine

## 2017-03-05 VITALS — BP 120/80 | HR 87 | Temp 98.2°F | Ht 77.0 in | Wt 277.8 lb

## 2017-03-05 DIAGNOSIS — E1159 Type 2 diabetes mellitus with other circulatory complications: Secondary | ICD-10-CM | POA: Diagnosis not present

## 2017-03-05 DIAGNOSIS — N528 Other male erectile dysfunction: Secondary | ICD-10-CM

## 2017-03-05 DIAGNOSIS — I10 Essential (primary) hypertension: Secondary | ICD-10-CM

## 2017-03-05 LAB — LIPID PANEL
Cholesterol: 186 mg/dL (ref 0–200)
HDL: 60.1 mg/dL (ref >39.00)
LDL CALC: 110 mg/dL — AB (ref 0–99)
NONHDL: 125.6
Total CHOL/HDL Ratio: 3
Triglycerides: 77 mg/dL (ref 0.0–149.0)
VLDL: 15.4 mg/dL (ref 0.0–40.0)

## 2017-03-05 LAB — CBC WITH DIFFERENTIAL/PLATELET
BASOS PCT: 0.9 % (ref 0.0–3.0)
Basophils Absolute: 0 10*3/uL (ref 0.0–0.1)
EOS PCT: 4.2 % (ref 0.0–5.0)
Eosinophils Absolute: 0.2 10*3/uL (ref 0.0–0.7)
HEMATOCRIT: 47.8 % (ref 39.0–52.0)
HEMOGLOBIN: 16 g/dL (ref 13.0–17.0)
Lymphocytes Relative: 36.7 % (ref 12.0–46.0)
Lymphs Abs: 1.8 10*3/uL (ref 0.7–4.0)
MCHC: 33.4 g/dL (ref 30.0–36.0)
MCV: 94.9 fl (ref 78.0–100.0)
MONOS PCT: 8 % (ref 3.0–12.0)
Monocytes Absolute: 0.4 10*3/uL (ref 0.1–1.0)
Neutro Abs: 2.5 10*3/uL (ref 1.4–7.7)
Neutrophils Relative %: 50.2 % (ref 43.0–77.0)
Platelets: 209 10*3/uL (ref 150.0–400.0)
RBC: 5.03 Mil/uL (ref 4.22–5.81)
RDW: 13.5 % (ref 11.5–15.5)
WBC: 5 10*3/uL (ref 4.0–10.5)

## 2017-03-05 LAB — MICROALBUMIN / CREATININE URINE RATIO
Creatinine,U: 71.1 mg/dL
Microalb Creat Ratio: 1.6 mg/g (ref 0.0–30.0)
Microalb, Ur: 1.2 mg/dL (ref 0.0–1.9)

## 2017-03-05 LAB — COMPREHENSIVE METABOLIC PANEL
ALBUMIN: 4.6 g/dL (ref 3.5–5.2)
ALK PHOS: 67 U/L (ref 39–117)
ALT: 27 U/L (ref 0–53)
AST: 22 U/L (ref 0–37)
BUN: 12 mg/dL (ref 6–23)
CHLORIDE: 99 meq/L (ref 96–112)
CO2: 29 mEq/L (ref 19–32)
Calcium: 9.9 mg/dL (ref 8.4–10.5)
Creatinine, Ser: 1.02 mg/dL (ref 0.40–1.50)
GFR: 103.15 mL/min (ref >60.00)
Glucose, Bld: 151 mg/dL — ABNORMAL HIGH (ref 70–99)
POTASSIUM: 5 meq/L (ref 3.5–5.1)
Sodium: 137 mEq/L (ref 135–145)
Total Bilirubin: 0.9 mg/dL (ref 0.2–1.2)
Total Protein: 7.3 g/dL (ref 6.0–8.3)

## 2017-03-05 LAB — TESTOSTERONE: TESTOSTERONE: 414.99 ng/dL (ref 300.00–890.00)

## 2017-03-05 LAB — POCT GLYCOSYLATED HEMOGLOBIN (HGB A1C): Hemoglobin A1C: 8.2

## 2017-03-05 LAB — TSH: TSH: 0.85 u[IU]/mL (ref 0.35–4.50)

## 2017-03-05 MED ORDER — INSULIN GLARGINE-LIXISENATIDE 100-33 UNT-MCG/ML ~~LOC~~ SOPN: 15.0000 [IU] | PEN_INJECTOR | Freq: Every day | SUBCUTANEOUS | 6 refills | Status: DC

## 2017-03-05 MED ORDER — SILDENAFIL CITRATE 20 MG PO TABS: ORAL_TABLET | ORAL | 0 refills | Status: DC

## 2017-03-05 NOTE — Patient Instructions (Signed)
Endocrinology follow-up   Please check your hemoglobin A1c every 3 months    It is important that you exercise regularly, at least 20 minutes 3 to 4 times per week.  If you develop chest pain or shortness of breath seek  medical attention.  You need to lose weight.  Consider a lower calorie diet and regular exercise.  Please see your eye doctor yearly to check for diabetic eye damage

## 2017-03-05 NOTE — Progress Notes (Signed)
Subjective:    Patient ID: Gene Weeks, male    DOB: 12-25-1975, 42 y.o.   MRN: [id number]  HPI  42 year old patient who is seen today after a prolonged absence. He has a history of diabetes which has been controlled with SU therapy as well as mealtime insulin only.  He has been seen by endocrinology in the past but not recently.  He states that he is having financial issues and having a difficult time with the cost of Humalog.  He has discontinued glipizide several months ago.  Apparently he continues to take [place] as well as metformin A few months ago he was on a much better diet with improved glycemic control.  Over the past 3 months and the holidays he has had some weight gain and much worse dietary habits including frequent use of ice cream. He has a prior history of ADD and this is a major concern today.  He wonders if he perhaps has a low testosterone issue.  He has a normal libido. No recent eye examination. No recent lab in over one year   Lab Results  Component Value Date   HGBA1C 8.2 03/05/2017   Past Medical History:  Diagnosis Date  . DIABETES MELLITUS, TYPE II 04/08/2008  . DVT (deep venous thrombosis) (HCC)    left leg  . HYPERTENSION 04/08/2008  . OSTEOARTHRITIS 04/08/2008     Social History   Socioeconomic History  . Marital status: Single    Spouse name: Not on file  . Number of children: Not on file  . Years of education: Not on file  . Highest education level: Not on file  Social Needs  . Financial resource strain: Not on file  . Food insecurity - worry: Not on file  . Food insecurity - inability: Not on file  . Transportation needs - medical: Not on file  . Transportation needs - non-medical: Not on file  Occupational History  . Not on file  Tobacco Use  . Smoking status: Never Smoker  . Smokeless tobacco: Never Used  Substance and Sexual Activity  . Alcohol use: No  . Drug use: No  . Sexual activity: Not on file  Other Topics Concern  . Not  on file  Social History Narrative  . Not on file    Past Surgical History:  Procedure Laterality Date  . KNEE ARTHROSCOPY     left    Family History  Problem Relation Age of Onset  . Cancer Mother        colon ca  . Hypothyroidism Mother   . Hyperlipidemia Father   . Diabetes Cousin     Allergies  Allergen Reactions  . Amoxicillin     Current Outpatient Medications on File Prior to Visit  Medication Sig Dispense Refill  . dapagliflozin propanediol (FARXIGA) 10 MG TABS tablet Take 10 mg daily by mouth. 30 tablet 1  . glucose blood (ONETOUCH VERIO) test strip USE TO CHECK BLOOD SUGAR THREE TIMES A DAY BEFORE MEALS AND PRN 100 each 12  . Insulin Aspart (NOVOLOG FLEXPEN [place]) Inject 8-18 Units into the skin daily.    . insulin aspart (NOVOLOG FLEXPEN) 100 UNIT/ML FlexPen BLOOD SUGAR<70=0 UNITS, BLOOD SUGAR 71-140=8 UNITS, 141-200=12 UNITS,>200=16 UNITS 5 pen 2  . lisinopril (PRINIVIL,ZESTRIL) 20 MG tablet TAKE 1 TABLET BY MOUTH EVERY DAY 90 tablet 3  . metFORMIN (GLUCOPHAGE-XR) 500 MG 24 hr tablet TAKE 2 TABLETS BY MOUTH TWICE DAILY 30 tablet 0  . NOVOFINE 32G X 6  MM MISC TEST ONCE DAILY AS NEEDED 100 each 0  . ONE TOUCH LANCETS MISC USE TO CHECK BLOOD SUGAR THREE TIMES A BEFORE MEALS AND PRN 100 each 12  . traMADol (ULTRAM) 50 MG tablet Take 1 tablet (50 mg total) by mouth every 8 (eight) hours as needed. 90 tablet 2   No current facility-administered medications on file prior to visit.     BP 120/80 (BP Location: Left Arm, Patient Position: Sitting, Cuff Size: Large)   Pulse 87   Temp 98.2 F (36.8 C) (Oral)   Ht 6\' 5"  (1.956 m)   Wt 277 lb 12.8 oz (126 kg)   SpO2 98%   BMI 32.94 kg/m    Review of Systems  Constitutional: Negative for appetite change, chills, fatigue and fever.  HENT: Negative for congestion, dental problem, ear pain, hearing loss, sore throat, tinnitus, trouble swallowing and voice change.   Eyes: Negative for pain, discharge and visual disturbance.   Respiratory: Negative for cough, chest tightness, wheezing and stridor.   Cardiovascular: Negative for chest pain, palpitations and leg swelling.  Gastrointestinal: Negative for abdominal distention, abdominal pain, blood in stool, constipation, diarrhea, nausea and vomiting.  Genitourinary: Negative for difficulty urinating, discharge, flank pain, genital sores, hematuria and urgency.  Musculoskeletal: Negative for arthralgias, back pain, gait problem, joint swelling, myalgias and neck stiffness.  Skin: Negative for rash.  Neurological: Negative for dizziness, syncope, speech difficulty, weakness, numbness and headaches.  Hematological: Negative for adenopathy. Does not bruise/bleed easily.  Psychiatric/Behavioral: Negative for behavioral problems and dysphoric mood. The patient is not nervous/anxious.        Objective:   Physical Exam  Constitutional: He is oriented to person, place, and time. He appears well-developed.  Weight 277 Blood pressure well controlled  HENT:  Head: Normocephalic.  Right Ear: External ear normal.  Left Ear: External ear normal.  Eyes: Conjunctivae and EOM are normal.  Neck: Normal range of motion.  Cardiovascular: Normal rate and normal heart sounds.  Pulmonary/Chest: Breath sounds normal.  Abdominal: Bowel sounds are normal.  Musculoskeletal: Normal range of motion. He exhibits no edema or tenderness.  Neurological: He is alert and oriented to person, place, and time.  Psychiatric: He has a normal mood and affect. His behavior is normal.          Assessment & Plan:   Diabetes mellitus.  Suboptimal control.  Will intensify treatment and add basal insulin.  We will continue bolus therapy metformin and Farxiga.  Check screening lab.  Eye examination encouraged Obesity additional weight loss recommended Essential hypertension stable no change in therapy ED.  A new prescription for sildenafil refilled.  Will check a testosterone level  Follow-up 3  months  Gene Weeks

## 2017-03-13 ENCOUNTER — Other Ambulatory Visit: Payer: Self-pay | Admitting: Endocrinology

## 2017-03-14 ENCOUNTER — Other Ambulatory Visit: Payer: Self-pay

## 2017-03-29 ENCOUNTER — Other Ambulatory Visit: Payer: Self-pay | Admitting: Internal Medicine

## 2017-03-30 ENCOUNTER — Other Ambulatory Visit: Payer: Self-pay | Admitting: Internal Medicine

## 2017-04-17 ENCOUNTER — Encounter: Payer: Self-pay | Admitting: Family Medicine

## 2017-04-17 ENCOUNTER — Ambulatory Visit: Payer: Self-pay | Admitting: Family Medicine

## 2017-04-17 VITALS — BP 118/90 | HR 108 | Temp 98.4°F | Resp 22 | Wt 283.8 lb

## 2017-04-17 DIAGNOSIS — R059 Cough, unspecified: Secondary | ICD-10-CM

## 2017-04-17 DIAGNOSIS — J069 Acute upper respiratory infection, unspecified: Secondary | ICD-10-CM

## 2017-04-17 DIAGNOSIS — R05 Cough: Secondary | ICD-10-CM

## 2017-04-17 MED ORDER — BENZONATATE 100 MG PO CAPS: 100.0000 mg | ORAL_CAPSULE | Freq: Three times a day (TID) | ORAL | 0 refills | Status: DC | PRN

## 2017-04-17 MED ORDER — DOXYCYCLINE HYCLATE 100 MG PO CAPS: 100.0000 mg | ORAL_CAPSULE | Freq: Two times a day (BID) | ORAL | 0 refills | Status: DC

## 2017-04-17 NOTE — Progress Notes (Signed)
Subjective:  Gene Weeks is a 42 y.o. male who presents for evaluation of URI like symptoms.  Symptoms include congestion, nasal congestion and productive cough with green colored sputum.  Onset of symptoms was 10 days ago, and has been gradually worsening since that time.  Treatment to date:  cough suppressants and decongestants.  High risk factors for influenza complications:  co-morbid illness.  The following portions of the patient's history were reviewed and updated as appropriate:  allergies, current medications and past medical history.  Pertinent items noted in HPI and remainder of comprehensive ROS otherwise negative. Objective:  BP 118/90 (BP Location: Left Arm, Patient Position: Sitting, Cuff Size: Large)   Pulse (!) 108   Temp 98.4 F (36.9 C) (Oral)   Resp (!) 22   Wt 283 lb 12.8 oz (128.7 kg)   SpO2 98%   BMI 33.65 kg/m  General appearance: alert and no distress Head: Normocephalic, without obvious abnormality, atraumatic Ears: normal TM's and external ear canals both ears Nose: Nares normal. Septum midline. Mucosa normal. No drainage or sinus tenderness., bilateral erythema to nares Throat: lips, mucosa, and tongue normal; teeth and gums normal Neck: no adenopathy, no carotid bruit, no JVD, supple, symmetrical, trachea midline and thyroid not enlarged, symmetric, no tenderness/mass/nodules Lungs: clear to auscultation bilaterally Skin: Skin color, texture, turgor normal. No rashes or lesions or Patient observed to be sweating on exam reports this is a chronic condition " I 'm a big guy, I always sweat" Lymph nodes: Cervical adenopathy: present    Assessment:  Upper Respiratory Infection    Plan:  Discussed diagnosis and treatment of URI. Supportive care with appropriate antipyretics and fluids. Follow up as needed. Advised to f/u with PCP in the next 7-10 days due to reviewed co-morbid conditions as well as presence of mild tachycardia on exam- discussed the potential  for this to be caused by consistent use of OTC cough/cold preperations for the last 2 weeks.  Meds ordered this encounter  Medications  . doxycycline (VIBRAMYCIN) 100 MG capsule    Sig: Take 1 capsule (100 mg total) by mouth 2 (two) times daily.    Dispense:  20 capsule    Refill:  0  . benzonatate (TESSALON) 100 MG capsule    Sig: Take 1-2 capsules (100-200 mg total) by mouth 3 (three) times daily as needed for cough.    Dispense:  40 capsule    Refill:  0     Elysa C. Tamsen Meek, FNP-C  Godfrey Pick. Tiburcio Pea, MSN, FNP-C [id number]  [place], [place] [address] [phone]

## 2017-04-17 NOTE — Patient Instructions (Addendum)
Advised to take antibiotic twice daily with food for 10 days Advised to increase fluid/water intake to help thin and clear secretions Advised to use prescribed tessalon pearls for cough  Follow up with PCP within 7-10 days as directed    Upper Respiratory Infection, Adult Most upper respiratory infections (URIs) are caused by a virus. A URI affects the nose, throat, and upper air passages. The most common type of URI is often called "the common cold." Follow these instructions at home:  Take medicines only as told by your doctor.  Gargle warm saltwater or take cough drops to comfort your throat as told by your doctor.  Use a warm mist humidifier or inhale steam from a shower to increase air moisture. This may make it easier to breathe.  Drink enough fluid to keep your pee (urine) clear or pale yellow.  Eat soups and other clear broths.  Have a healthy diet.  Rest as needed.  Go back to work when your fever is gone or your doctor says it is okay. ? You may need to stay home longer to avoid giving your URI to others. ? You can also wear a face mask and wash your hands often to prevent spread of the virus.  Use your inhaler more if you have asthma.  Do not use any tobacco products, including cigarettes, chewing tobacco, or electronic cigarettes. If you need help quitting, ask your doctor. Contact a doctor if:  You are getting worse, not better.  Your symptoms are not helped by medicine.  You have chills.  You are getting more short of breath.  You have brown or red mucus.  You have yellow or brown discharge from your nose.  You have pain in your face, especially when you bend forward.  You have a fever.  You have puffy (swollen) neck glands.  You have pain while swallowing.  You have white areas in the back of your throat. Get help right away if:  You have very bad or constant: ? Headache. ? Ear pain. ? Pain in your forehead, behind your eyes, and over your  cheekbones (sinus pain). ? Chest pain.  You have long-lasting (chronic) lung disease and any of the following: ? Wheezing. ? Long-lasting cough. ? Coughing up blood. ? A change in your usual mucus.  You have a stiff neck.  You have changes in your: ? Vision. ? Hearing. ? Thinking. ? Mood. This information is not intended to replace advice given to you by your health care provider. Make sure you discuss any questions you have with your health care provider. Document Released: 07/31/2007 Document Revised: 10/15/2015 Document Reviewed: 05/19/2013 Elsevier Interactive Patient Education  2018 [employer].

## 2017-04-23 ENCOUNTER — Telehealth: Payer: Self-pay

## 2017-04-26 ENCOUNTER — Other Ambulatory Visit: Payer: Self-pay | Admitting: Endocrinology

## 2017-04-26 NOTE — Telephone Encounter (Signed)
Please refill x 1 Ov is due  

## 2017-05-19 ENCOUNTER — Other Ambulatory Visit: Payer: Self-pay | Admitting: Internal Medicine

## 2017-06-02 ENCOUNTER — Other Ambulatory Visit: Payer: Self-pay | Admitting: Endocrinology

## 2017-06-03 ENCOUNTER — Ambulatory Visit: Payer: Self-pay | Admitting: Internal Medicine

## 2017-06-18 ENCOUNTER — Other Ambulatory Visit: Payer: Self-pay | Admitting: Internal Medicine

## 2017-06-20 ENCOUNTER — Other Ambulatory Visit: Payer: Self-pay

## 2017-07-03 ENCOUNTER — Other Ambulatory Visit: Payer: Self-pay | Admitting: Internal Medicine

## 2017-07-12 ENCOUNTER — Other Ambulatory Visit: Payer: Self-pay | Admitting: Internal Medicine

## 2017-07-16 ENCOUNTER — Ambulatory Visit: Payer: Self-pay | Admitting: Internal Medicine

## 2017-07-24 ENCOUNTER — Other Ambulatory Visit: Payer: Self-pay | Admitting: Internal Medicine

## 2017-07-26 ENCOUNTER — Other Ambulatory Visit: Payer: Self-pay | Admitting: Endocrinology

## 2017-07-26 NOTE — Telephone Encounter (Signed)
Please refill x 1 Ov is due  

## 2017-07-28 ENCOUNTER — Other Ambulatory Visit: Payer: Self-pay | Admitting: Endocrinology

## 2017-08-21 ENCOUNTER — Ambulatory Visit: Payer: BC Managed Care – PPO | Admitting: Internal Medicine

## 2017-08-21 ENCOUNTER — Encounter: Payer: Self-pay | Admitting: Internal Medicine

## 2017-08-21 VITALS — BP 122/88 | HR 84 | Temp 98.0°F | Wt 284.0 lb

## 2017-08-21 DIAGNOSIS — I10 Essential (primary) hypertension: Secondary | ICD-10-CM | POA: Diagnosis not present

## 2017-08-21 DIAGNOSIS — E1159 Type 2 diabetes mellitus with other circulatory complications: Secondary | ICD-10-CM | POA: Diagnosis not present

## 2017-08-21 LAB — POCT GLYCOSYLATED HEMOGLOBIN (HGB A1C): Hemoglobin A1C: 8.2 % — AB (ref 4.0–5.6)

## 2017-08-21 MED ORDER — GLUCOSE BLOOD VI STRP: ORAL_STRIP | 12 refills | Status: DC

## 2017-08-21 MED ORDER — DAPAGLIFLOZIN PROPANEDIOL 10 MG PO TABS: 10.0000 mg | ORAL_TABLET | Freq: Every day | ORAL | 3 refills | Status: DC

## 2017-08-21 MED ORDER — LISINOPRIL 20 MG PO TABS: 20.0000 mg | ORAL_TABLET | Freq: Every day | ORAL | 0 refills | Status: DC

## 2017-08-21 MED ORDER — SILDENAFIL CITRATE 20 MG PO TABS: ORAL_TABLET | ORAL | 0 refills | Status: DC

## 2017-08-21 MED ORDER — INSULIN ASPART 100 UNIT/ML FLEXPEN: PEN_INJECTOR | SUBCUTANEOUS | 0 refills | Status: DC

## 2017-08-21 MED ORDER — INSULIN GLARGINE-LIXISENATIDE 100-33 UNT-MCG/ML ~~LOC~~ SOPN: 15.0000 [IU] | PEN_INJECTOR | Freq: Every day | SUBCUTANEOUS | 6 refills | Status: DC

## 2017-08-21 NOTE — Progress Notes (Signed)
Subjective:    Patient ID: Gene Weeks, male    DOB: 1975/03/27, 42 y.o.   MRN: [id number]  HPI  Lab Results  Component Value Date   HGBA1C 8.2 03/05/2017   42 year old patient who is seen today for follow-up of type 2 diabetes.  He has not been seen in the past 5 months. He has been referred to endocrinology but has not been seen by Dr. Everardo All in some time. He has not been compliant with all his medications.  He has been prescribed Soliqua in the past but apparently took samples and never got this medication refilled.  He is on mealtime insulin 2-3 times daily sliding scale He has essential hypertension No recent eye examination  Social history at the present time he is working full-time at [employer]  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 DIABETES MELLITUS, TYPE II 04/08/2008  . DVT (deep venous thrombosis) (HCC)    left leg  . HYPERTENSION 04/08/2008  . OSTEOARTHRITIS 04/08/2008     Social History   Socioeconomic History  . Marital status: Single    Spouse name: Not on file  . Number of children: Not on file  . Years of education: Not on file  . Highest education level: Not on file  Occupational History  . Not on file  Social Needs  . Financial resource strain: Not on file  . Food insecurity:    Worry: Not on file    Inability: Not on file  . Transportation needs:    Medical: Not on file    Non-medical: Not on file  Tobacco Use  . Smoking status: Never Smoker  . Smokeless tobacco: Never Used  Substance and Sexual Activity  . Alcohol use: No  . Drug use: No  . Sexual activity: Not on file  Lifestyle  . Physical activity:    Days per week: Not on file    Minutes per session: Not on file  . Stress: Not on file  Relationships  . Social connections:    Talks on phone: Not on file    Gets together: Not on file    Attends religious service: Not on file    Active  member of club or organization: Not on file    Attends meetings of clubs or organizations: Not on file    Relationship status: Not on file  . Intimate partner violence:    Fear of current or ex partner: Not on file    Emotionally abused: Not on file    Physically abused: Not on file    Forced sexual activity: Not on file  Other Topics Concern  . Not on file  Social History Narrative  . Not on file    Past Surgical History:  Procedure Laterality Date  . KNEE ARTHROSCOPY     left    Family History  Problem Relation Age of Onset  . Cancer Mother        colon ca  . Hypothyroidism Mother   . Hyperlipidemia Father   . Diabetes Cousin     Allergies  Allergen Reactions  . Amoxicillin     Current Outpatient Medications on File Prior to Visit  Medication Sig Dispense Refill  . metFORMIN (GLUCOPHAGE-XR) 500 MG 24 hr tablet TAKE 2 TABLETS BY MOUTH TWICE DAILY 180 tablet 0  . NOVOFINE 32G X 6 MM MISC TEST ONCE DAILY AS NEEDED  100 each 0  . ONE TOUCH LANCETS MISC USE TO CHECK BLOOD SUGAR THREE TIMES A BEFORE MEALS AND PRN 100 each 12   No current facility-administered medications on file prior to visit.     BP 122/88 (BP Location: Right Arm, Patient Position: Sitting, Cuff Size: Large)   Pulse 84   Temp 98 F (36.7 C) (Oral)   Wt 284 lb (128.8 kg)   SpO2 98%   BMI 33.68 kg/m       Review of Systems  Constitutional: Negative for appetite change, chills, fatigue and fever.  HENT: Negative for congestion, dental problem, ear pain, hearing loss, sore throat, tinnitus, trouble swallowing and voice change.   Eyes: Negative for pain, discharge and visual disturbance.  Respiratory: Negative for cough, chest tightness, wheezing and stridor.   Cardiovascular: Negative for chest pain, palpitations and leg swelling.  Gastrointestinal: Negative for abdominal distention, abdominal pain, blood in stool, constipation, diarrhea, nausea and vomiting.  Genitourinary: Negative for  difficulty urinating, discharge, flank pain, genital sores, hematuria and urgency.  Musculoskeletal: Negative for arthralgias, back pain, gait problem, joint swelling, myalgias and neck stiffness.  Skin: Negative for rash.  Neurological: Negative for dizziness, syncope, speech difficulty, weakness, numbness and headaches.  Hematological: Negative for adenopathy. Does not bruise/bleed easily.  Psychiatric/Behavioral: Negative for behavioral problems and dysphoric mood. The patient is not nervous/anxious.        Objective:   Physical Exam  Constitutional: He is oriented to person, place, and time. He appears well-developed.  HENT:  Head: Normocephalic.  Right Ear: External ear normal.  Left Ear: External ear normal.  Eyes: Conjunctivae and EOM are normal.  Neck: Normal range of motion.  Cardiovascular: Normal rate and normal heart sounds.  Pulmonary/Chest: Breath sounds normal.  Abdominal: Bowel sounds are normal.  Musculoskeletal: Normal range of motion. He exhibits no edema or tenderness.  Neurological: He is alert and oriented to person, place, and time.  Psychiatric: He has a normal mood and affect. His behavior is normal.          Assessment & Plan:  Diabetes mellitus.  Poor control. Compliance issues.  All medications updated.  Samples of Soliqua dispensed Annual eye examination encouraged Endocrinology follow-up recommended  The patient has been asked to follow-up with a new provider in 3 months  Gene Weeks

## 2017-08-21 NOTE — Patient Instructions (Signed)
Please check your hemoglobin A1c every 3 months  Please schedule a follow-up appointment with Dr. Everardo All  Please see your eye doctor yearly to check for diabetic eye damage  Return in 3 months for follow-up and to establish with a new provider

## 2017-08-23 ENCOUNTER — Other Ambulatory Visit: Payer: Self-pay | Admitting: Internal Medicine

## 2017-09-10 ENCOUNTER — Emergency Department (HOSPITAL_COMMUNITY): Payer: No Typology Code available for payment source

## 2017-09-10 ENCOUNTER — Encounter (HOSPITAL_COMMUNITY): Payer: Self-pay | Admitting: Emergency Medicine

## 2017-09-10 ENCOUNTER — Other Ambulatory Visit: Payer: Self-pay

## 2017-09-10 ENCOUNTER — Emergency Department (HOSPITAL_COMMUNITY)
Admission: EM | Admit: 2017-09-10 | Discharge: 2017-09-10 | Disposition: A | Discharge status: Home / Self Care | Payer: No Typology Code available for payment source | Attending: Emergency Medicine | Admitting: Emergency Medicine

## 2017-09-10 DIAGNOSIS — Z79899 Other long term (current) drug therapy: Secondary | ICD-10-CM | POA: Diagnosis not present

## 2017-09-10 DIAGNOSIS — I10 Essential (primary) hypertension: Secondary | ICD-10-CM | POA: Diagnosis not present

## 2017-09-10 DIAGNOSIS — E119 Type 2 diabetes mellitus without complications: Secondary | ICD-10-CM | POA: Diagnosis not present

## 2017-09-10 DIAGNOSIS — M25521 Pain in right elbow: Secondary | ICD-10-CM

## 2017-09-10 DIAGNOSIS — Z794 Long term (current) use of insulin: Secondary | ICD-10-CM | POA: Diagnosis not present

## 2017-09-10 MED ORDER — NAPROXEN 500 MG PO TABS: 500.0000 mg | ORAL_TABLET | Freq: Two times a day (BID) | ORAL | 0 refills | Status: DC

## 2017-09-10 NOTE — ED Provider Notes (Signed)
[facility] EMERGENCY DEPARTMENT Provider Note   CSN: [id number] Arrival date & time: 09/10/17  0033     History   Chief Complaint Chief Complaint  Patient presents with  . Elbow Pain    HPI Gene Weeks is a 42 y.o. male.  The history is provided by the patient and medical records.     42 year old male with history of diabetes, DVT no longer on anticoagulation, hypertension, arthritis, presenting to the ED with right elbow pain.  Patient states he was helping a coworker move a shelf at work when he felt a "pop" in his right elbow.  Shelf was about 150 lbs.  Sent here for further evaluation by his supervisor.  Denies numbness/weakness of right arm or hand.  He is right hand dominant.  No intervention tried PTA.  Past Medical History:  Diagnosis Date  . DIABETES MELLITUS, TYPE II 04/08/2008  . DVT (deep venous thrombosis) (HCC)    left leg  . HYPERTENSION 04/08/2008  . OSTEOARTHRITIS 04/08/2008    Patient Active Problem List   Diagnosis Date Noted  . Left leg DVT (HCC) 09/01/2015  . Post concussion syndrome 06/07/2015  . 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 C6 cervical fracture (HCC) 06/07/2015  . PTSD (post-traumatic stress disorder) 06/07/2015  . Erectile dysfunction 10/06/2014  . 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 Stroke syndrome 02/24/2013  . Essential hypertension 04/08/2008  . OSTEOARTHRITIS 04/08/2008    Past Surgical History:  Procedure Laterality Date  . KNEE ARTHROSCOPY     left        Home Medications    Prior to Admission medications   Medication Sig Start Date End Date Taking? Authorizing Provider  dapagliflozin propanediol (FARXIGA) 10 MG TABS tablet Take 10 mg by mouth daily. 08/21/17   [name], MD  glucose blood [facility] VERIO) test strip USE TO CHECK BLOOD SUGAR THREE TIMES A DAY BEFORE MEALS AND PRN 08/21/17   [name], MD  insulin aspart (NOVOLOG FLEXPEN) 100  UNIT/ML FlexPen INJECT EVERY DAY UNDER THE SKIN PER SLIDING SCALE. IF BLOOD SUGAR IS<70 INJECT 0 UNITS, IF 71-140= 8 UNITS, 141-200= 12 UNITS,>200 16 UNITS 08/21/17   [name], MD  Insulin Glargine-Lixisenatide [facility]) 100-33 UNT-MCG/ML SOPN Inject 15 Units into the skin at bedtime. 08/21/17   [name], MD  lisinopril (PRINIVIL,ZESTRIL) 20 MG tablet Take 1 tablet (20 mg total) by mouth daily. 08/21/17   [name], MD  metFORMIN (GLUCOPHAGE-XR) 500 MG 24 hr tablet TAKE 2 TABLETS BY MOUTH TWICE DAILY 07/15/17   [name], MD  NOVOFINE 32G X 6 MM MISC TEST ONCE DAILY AS NEEDED 09/13/16   [name], MD  ONE TOUCH LANCETS MISC USE TO CHECK BLOOD SUGAR THREE TIMES A BEFORE MEALS AND PRN 09/09/14   [name], MD  sildenafil (REVATIO) 20 MG tablet 4-5 tablets daily as needed for ED 08/21/17   [name], MD    Family History Family History  Problem Relation Age of Onset  . Cancer Mother        colon ca  . Hypothyroidism Mother   . Hyperlipidemia Father   . Diabetes Cousin     Social History Social History   Tobacco Use  . Smoking status: Never Smoker  . Smokeless tobacco: Never Used  Substance Use Topics  . Alcohol use: No  . Drug use: No     Allergies  Amoxicillin   Review of Systems Review of Systems  Musculoskeletal: Positive for arthralgias.  All other systems reviewed and are negative.    Physical Exam Updated Vital Signs BP (!) 128/94   Pulse (!) 126   Temp 99 F (37.2 C)   Resp 18   SpO2 96%   Physical Exam  Constitutional: He is oriented to person, place, and time. He appears well-developed and well-nourished.  HENT:  Head: Normocephalic and atraumatic.  Mouth/Throat: Oropharynx is clear and moist.  Eyes: Pupils are equal, round, and reactive to light. Conjunctivae and EOM are normal.  Neck: Normal range of motion.  Cardiovascular: Normal rate, regular rhythm and normal heart sounds.    Pulmonary/Chest: Effort normal and breath sounds normal. No stridor. No respiratory distress.  Abdominal: Soft. Bowel sounds are normal. There is no tenderness. There is no rebound.  Musculoskeletal: Normal range of motion.  Right elbow overall normal in appearance, some mild tenderness along the lateral epicondyle without bony deformity or swelling, full range of motion maintained, some pain noted with full flexion, normal grip strength, normal distal sensation and perfusion  Neurological: He is alert and oriented to person, place, and time.  Skin: Skin is warm and dry.  Psychiatric: He has a normal mood and affect.  Nursing note and vitals reviewed.    ED Treatments / Results  Labs (all labs ordered are listed, but only abnormal results are displayed) Labs Reviewed - No data to display  EKG None  Radiology Dg Elbow Complete Right  Result Date: 09/10/2017 CLINICAL DATA:  42 year old male with pain over the lateral aspect of the right elbow. EXAM: RIGHT ELBOW - COMPLETE 3+ VIEW COMPARISON:  None. FINDINGS: There is no acute fracture or dislocation. No significant arthritic changes. There is a 1 cm bone spur arising from the olecranon process. No joint effusion. The soft tissues appear unremarkable. IMPRESSION: No acute fracture or dislocation. Electronically Signed   By: Elgie Collard M.D.   On: 09/10/2017 01:47    Procedures Procedures (including critical care time)  Medications Ordered in ED Medications - No data to display   Initial Impression / Assessment and Plan / ED Course  I have reviewed the triage vital signs and the nursing notes.  Pertinent labs & imaging results that were available during my care of the patient were reviewed by me and considered in my medical decision making (see chart for details).  42 year old male here with right elbow pain after helping a coworker lift a shelf.  States he felt a "pop" in the right elbow.  He has no acute deformity noted on  exam.  Full range of motion, but some mild pain with full flexion.  Arm is neurovascularly intact with normal grip strength.  X-ray negative.  Suspect strain type injury.  Recommended elbow sleeve, anti-inflammatories.  Given follow-up with orthopedics if any ongoing issues.  He will return here for any new/acute changes.  Final Clinical Impressions(s) / ED Diagnoses   Final diagnoses:  Right elbow pain    ED Discharge Orders        Ordered    naproxen (NAPROSYN) 500 MG tablet  2 times daily with meals     09/10/17 0157       Garlon Hatchet, PA-C 09/10/17 0325    Dione Booze, MD 09/10/17 [phone]

## 2017-09-10 NOTE — ED Triage Notes (Signed)
Pt reports R elbow pain after helping lift a shelf at work.

## 2017-09-10 NOTE — Discharge Instructions (Signed)
Take the prescribed medication as directed.  Can try using elbow sleeve to help with pain/offer support. Follow-up with Dr. Amanda Pea if you continue having issueswith your elbow. Return to the ED for new or worsening symptoms.

## 2017-09-10 NOTE — ED Notes (Signed)
Patient transported to X-ray 

## 2017-09-10 NOTE — ED Notes (Signed)
Pt ready for discharge VS documented 

## 2017-10-02 ENCOUNTER — Other Ambulatory Visit: Payer: Self-pay | Admitting: Internal Medicine

## 2017-10-07 ENCOUNTER — Other Ambulatory Visit: Payer: Self-pay | Admitting: Internal Medicine

## 2017-10-18 ENCOUNTER — Other Ambulatory Visit: Payer: Self-pay | Admitting: Internal Medicine

## 2017-11-20 ENCOUNTER — Other Ambulatory Visit: Payer: Self-pay | Admitting: Internal Medicine

## 2017-12-16 ENCOUNTER — Other Ambulatory Visit: Payer: Self-pay | Admitting: Internal Medicine

## 2017-12-17 MED ORDER — INSULIN PEN NEEDLE 32G X 6 MM MISC: 0 refills | Status: DC

## 2017-12-17 MED ORDER — ONETOUCH LANCETS MISC: 12 refills | Status: DC

## 2017-12-17 MED ORDER — GLUCOSE BLOOD VI STRP: ORAL_STRIP | 12 refills | Status: DC

## 2017-12-17 MED ORDER — METFORMIN HCL ER 500 MG PO TB24: 1000.0000 mg | ORAL_TABLET | Freq: Two times a day (BID) | ORAL | 0 refills | Status: DC

## 2017-12-17 MED ORDER — LISINOPRIL 20 MG PO TABS: 20.0000 mg | ORAL_TABLET | Freq: Every day | ORAL | 0 refills | Status: DC

## 2017-12-17 MED ORDER — DAPAGLIFLOZIN PROPANEDIOL 10 MG PO TABS: 10.0000 mg | ORAL_TABLET | Freq: Every day | ORAL | 3 refills | Status: DC

## 2017-12-17 MED ORDER — INSULIN GLARGINE-LIXISENATIDE 100-33 UNT-MCG/ML ~~LOC~~ SOPN: 15.0000 [IU] | PEN_INJECTOR | Freq: Every day | SUBCUTANEOUS | 6 refills | Status: DC

## 2017-12-17 MED ORDER — NAPROXEN 500 MG PO TABS: 500.0000 mg | ORAL_TABLET | Freq: Two times a day (BID) | ORAL | 0 refills | Status: DC

## 2017-12-17 NOTE — Telephone Encounter (Signed)
Pt aware to follow up with new PCP for further refills due to Dr.Kwiakowski's retiring. 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

## 2017-12-17 NOTE — Telephone Encounter (Signed)
Left message to return phone call.

## 2018-01-05 ENCOUNTER — Other Ambulatory Visit: Payer: Self-pay | Admitting: Internal Medicine

## 2018-01-15 ENCOUNTER — Other Ambulatory Visit: Payer: Self-pay | Admitting: Family Medicine

## 2018-01-28 ENCOUNTER — Other Ambulatory Visit: Payer: Self-pay | Admitting: Family Medicine

## 2018-02-16 ENCOUNTER — Telehealth: Payer: Self-pay | Admitting: Internal Medicine

## 2018-02-16 MED ORDER — INSULIN ASPART 100 UNIT/ML FLEXPEN: PEN_INJECTOR | SUBCUTANEOUS | 0 refills | Status: DC

## 2018-02-16 NOTE — Telephone Encounter (Signed)
Rx sent.  Patient is aware that he will need to establish with a new PCP for further refills.

## 2018-02-16 NOTE — Telephone Encounter (Addendum)
Patient needs a refill on his Novolog Flex pen.  After today he will be out and he is going out of town tomorrow.    Pharmacy:  [facility] on [address] in [place]                     [phone] [place] [address]

## 2018-02-24 ENCOUNTER — Other Ambulatory Visit: Payer: Self-pay | Admitting: *Deleted

## 2018-02-24 MED ORDER — INSULIN ASPART 100 UNIT/ML FLEXPEN: PEN_INJECTOR | SUBCUTANEOUS | 0 refills | Status: DC

## 2018-02-26 ENCOUNTER — Encounter: Payer: Self-pay | Admitting: Family Medicine

## 2018-02-26 ENCOUNTER — Ambulatory Visit: Payer: BC Managed Care – PPO | Admitting: Family Medicine

## 2018-02-26 VITALS — BP 118/84 | HR 94 | Temp 98.3°F | Ht 77.0 in | Wt 290.4 lb

## 2018-02-26 DIAGNOSIS — E1159 Type 2 diabetes mellitus with other circulatory complications: Secondary | ICD-10-CM

## 2018-02-26 DIAGNOSIS — N528 Other male erectile dysfunction: Secondary | ICD-10-CM

## 2018-02-26 DIAGNOSIS — I10 Essential (primary) hypertension: Secondary | ICD-10-CM

## 2018-02-26 DIAGNOSIS — Z23 Encounter for immunization: Secondary | ICD-10-CM

## 2018-02-26 LAB — POCT GLYCOSYLATED HEMOGLOBIN (HGB A1C): Hemoglobin A1C: 8.7 % — AB (ref 4.0–5.6)

## 2018-02-26 MED ORDER — LISINOPRIL 20 MG PO TABS: 20.0000 mg | ORAL_TABLET | Freq: Every day | ORAL | 2 refills | Status: DC

## 2018-02-26 MED ORDER — INSULIN GLARGINE-LIXISENATIDE 100-33 UNT-MCG/ML ~~LOC~~ SOPN: 19.0000 [IU] | PEN_INJECTOR | Freq: Every day | SUBCUTANEOUS | 6 refills | Status: DC

## 2018-02-26 MED ORDER — DAPAGLIFLOZIN PROPANEDIOL 10 MG PO TABS: 10.0000 mg | ORAL_TABLET | Freq: Every day | ORAL | 3 refills | Status: DC

## 2018-02-26 MED ORDER — INSULIN ASPART 100 UNIT/ML FLEXPEN: PEN_INJECTOR | SUBCUTANEOUS | 0 refills | Status: DC

## 2018-02-26 MED ORDER — TADALAFIL 20 MG PO TABS: 10.0000 mg | ORAL_TABLET | ORAL | 5 refills | Status: DC | PRN

## 2018-02-26 NOTE — Assessment & Plan Note (Signed)
Will try cialis 10-20mg  q48hrs prn. Discussed potential side effects.

## 2018-02-26 NOTE — Assessment & Plan Note (Signed)
A1c elevated to 8.7 today.  Reinforced importance of compliance with his medications. Will increase soliqua to 19U daily. Continue current doses of farxiga 10mg  qd, metformin 1000mg  bid, and novolog SSI. He will follow up with me or endocrinology in 3 months.

## 2018-02-26 NOTE — Patient Instructions (Signed)
It was very nice to see you today!  Please start the Cialis.  Please let me know if you  have any side effects.  Increase your Soliqua to 19 units daily.  We will not make any other changes today.  Come back to see me in 3 months to recheck your blood sugar, to see Dr. Everardo All.  Take care, Dr [name]

## 2018-02-26 NOTE — Assessment & Plan Note (Signed)
At goal  Continue lisinopril 20mg daily

## 2018-02-26 NOTE — Progress Notes (Signed)
   Subjective:  Gene Weeks is a 44 y.o. male who presents today with a chief complaint of T2DM and to transfer care to this office.   HPI:  His chronic conditions are outlined below:  # T2DM - Currently on farxiga 10mg  daily, metformin 1000mg  bid,  soliqua 15U at bedtime, and novolog SSI. Tolerating well with no side effects. Occasionally forgets to take injectable medications. Sugars usually in the upper 100s.   # HTN - On lisinopril 20mg  daily. Tolerating well without side effects.   # ED - Takes sildenafil as needed. Tolerating well without side effects, though is interested in possibly starting a longer acting medication.   ROS: Per HPI  PMH: He reports that he has never smoked. He has never used smokeless tobacco. He reports that he does not drink alcohol or use drugs.  Objective:  Physical Exam: BP 118/84 (BP Location: Left Arm, Patient Position: Sitting, Cuff Size: Large)   Pulse 94   Temp 98.3 F (36.8 C) (Oral)   Ht 6\' 5"  (1.956 m)   Wt 290 lb 6.4 oz (131.7 kg)   SpO2 98%   BMI 34.44 kg/m   Gen: NAD, resting comfortably CV: RRR with no murmurs appreciated Pulm: NWOB, CTAB with no crackles, wheezes, or rhonchi  Results for orders placed or performed in visit on 02/26/18 (from the past 24 hour(s))  POCT glycosylated hemoglobin (Hb A1C)     Status: Abnormal   Collection Time: 02/26/18  2:53 PM  Result Value Ref Range   Hemoglobin A1C 8.7 (A) 4.0 - 5.6 %    Assessment/Plan:  Type 2 diabetes mellitus with vascular disease A1c elevated to 8.7 today.  Reinforced importance of compliance with his medications. Will increase soliqua to 19U daily. Continue current doses of farxiga 10mg  qd, metformin 1000mg  bid, and novolog SSI. He will follow up with me or endocrinology in 3 months.   Essential hypertension At goal. Continue lisinopril 20mg  daily.   Erectile dysfunction Will try cialis 10-20mg  q48hrs prn. Discussed potential side effects.   Preventative  Healthcare Patient was instructed to return soon for CPE. Flu shot given today.  Time Spent: I spent >40 minutes face-to-face with the patient, with more than half spent on counseling for management plan for his T2DM, HTN, and ED.   [name]. [name], MD 02/26/2018 8:20 PM

## 2018-03-10 ENCOUNTER — Other Ambulatory Visit: Payer: Self-pay | Admitting: Family Medicine

## 2018-03-22 ENCOUNTER — Other Ambulatory Visit: Payer: Self-pay | Admitting: Internal Medicine

## 2018-03-31 IMAGING — CT CT CERVICAL SPINE W/O CM
3 of 4 series · 11 of 33 positions shown, 13 images · non-contrast
Comparison: MRI of cervical spine 05/13/2015 and CT of cervical
spine 04/26/2015.

CLINICAL DATA: Cervical spine fracture.

EXAM:
CT CERVICAL SPINE WITHOUT CONTRAST
TECHNIQUE: Multidetector CT imaging of the cervical spine was performed without
intravenous contrast. Multiplanar CT image reconstructions were also
generated.

[Series 6: cor · coronal · 0.23mm/px · 3 of 57 slices shown]
[im 12/57  bone]
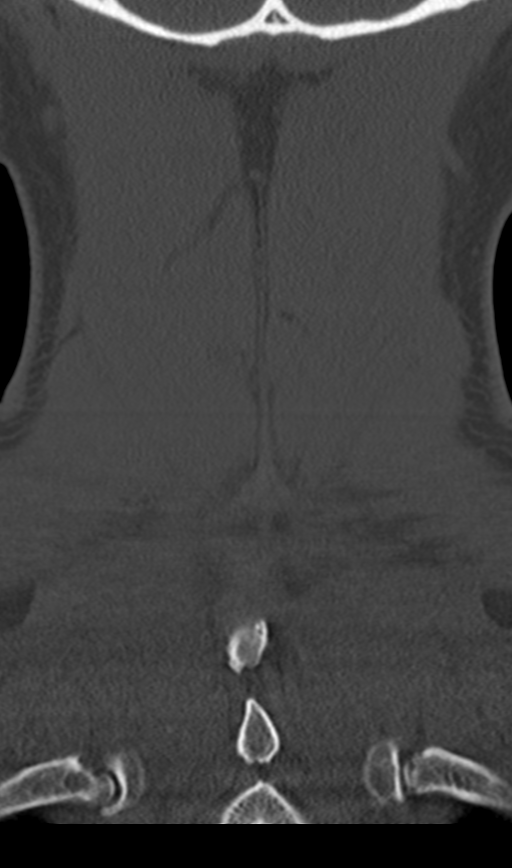
[im 23/57  bone]
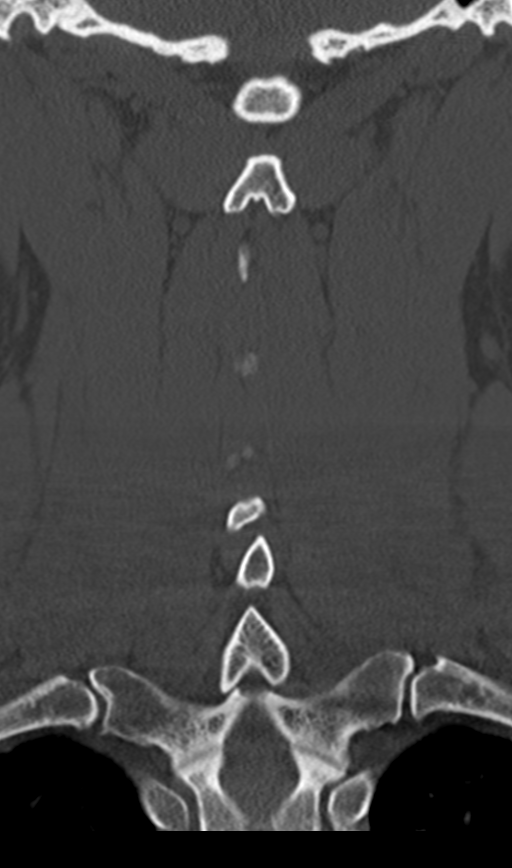
[im 34/57  bone]
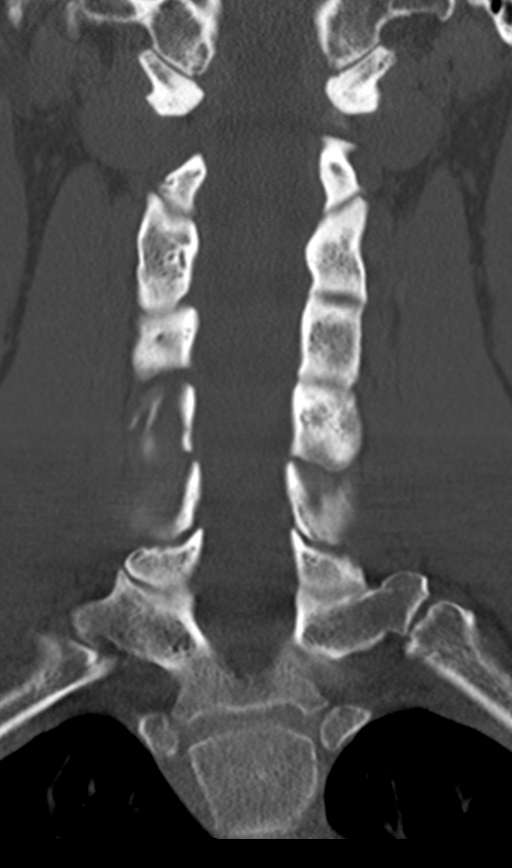

[Series 7: sag · sagittal · 0.23mm/px · 5 of 53 slices shown, 6 images]
[im 18/53  bone]
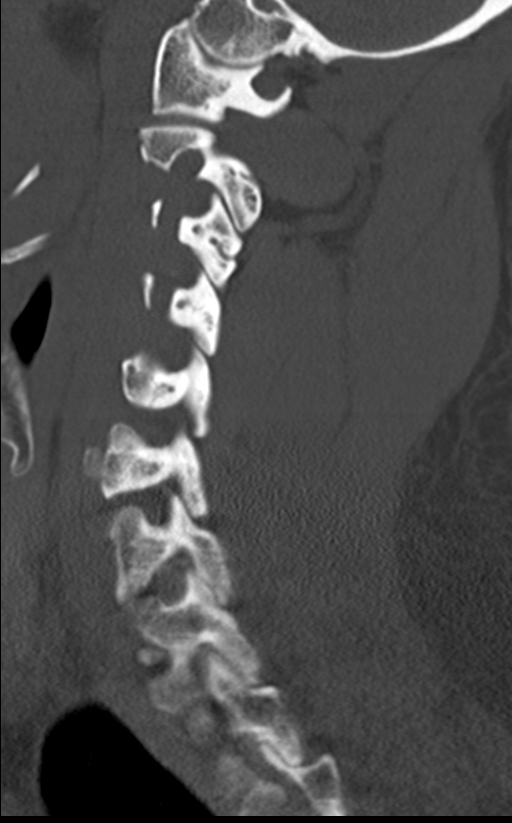
[im 22/53  bone]
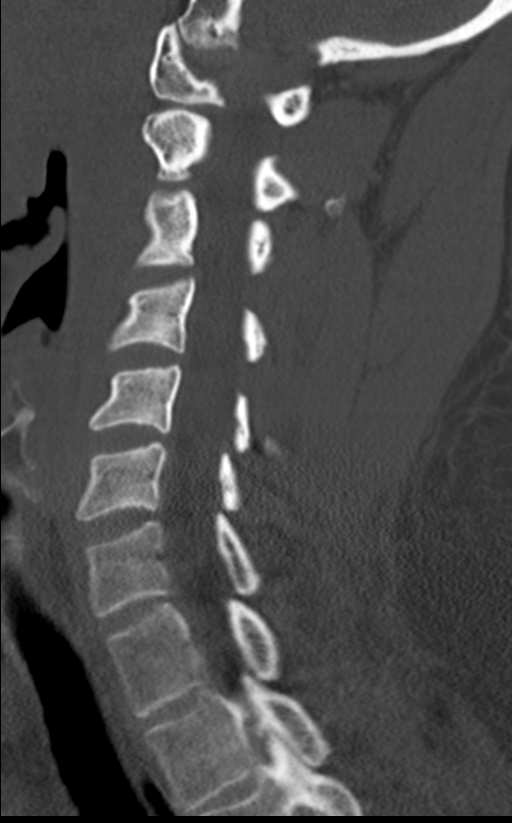
[im 27/53  soft-tissue]
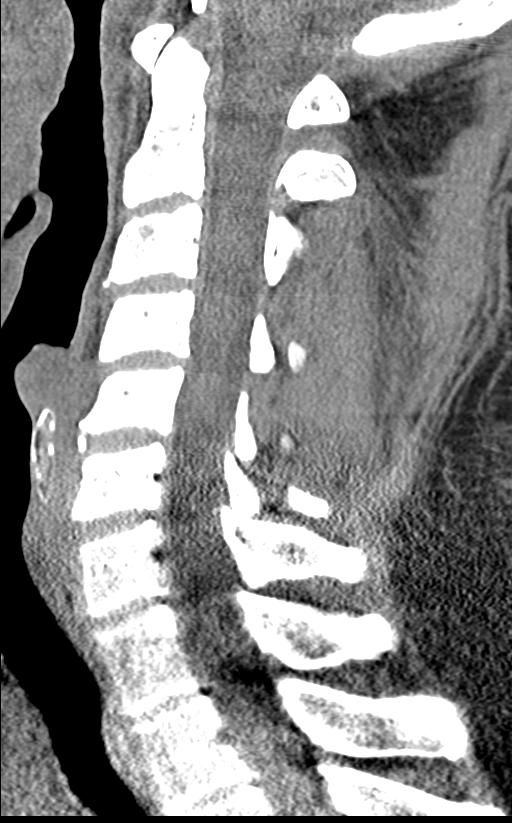
[im 27/53  bone]
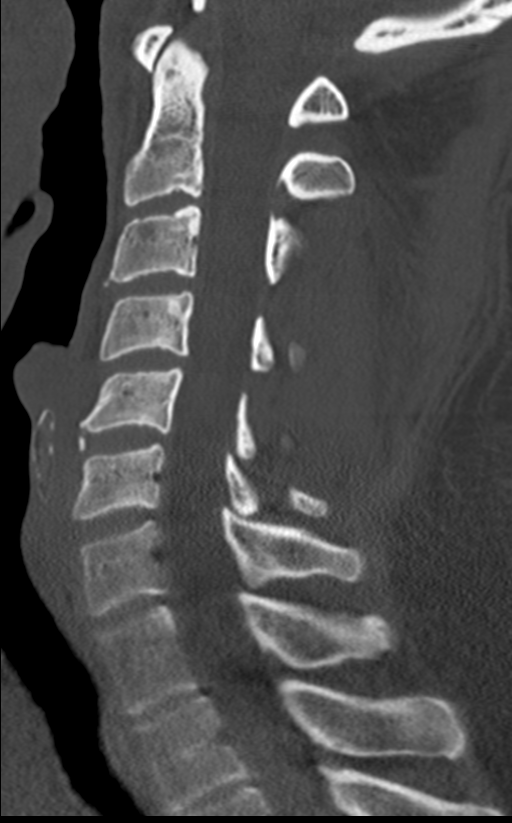
[im 31/53  bone]
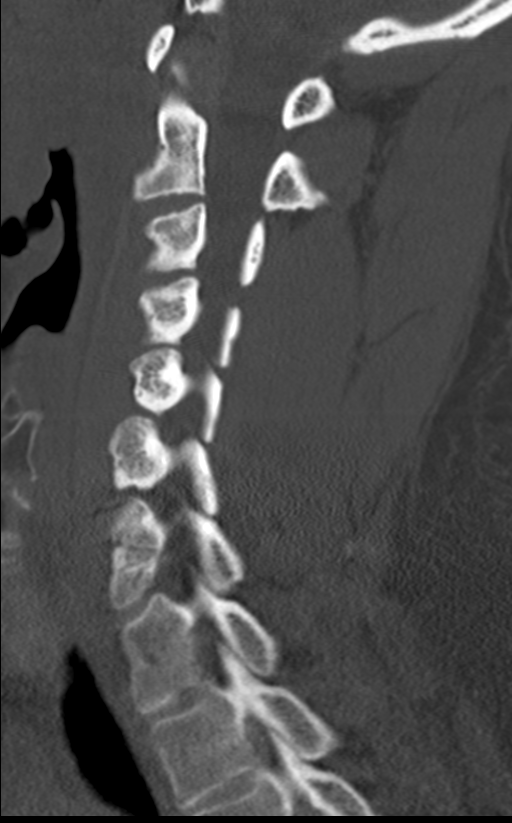
[im 35/53  bone]
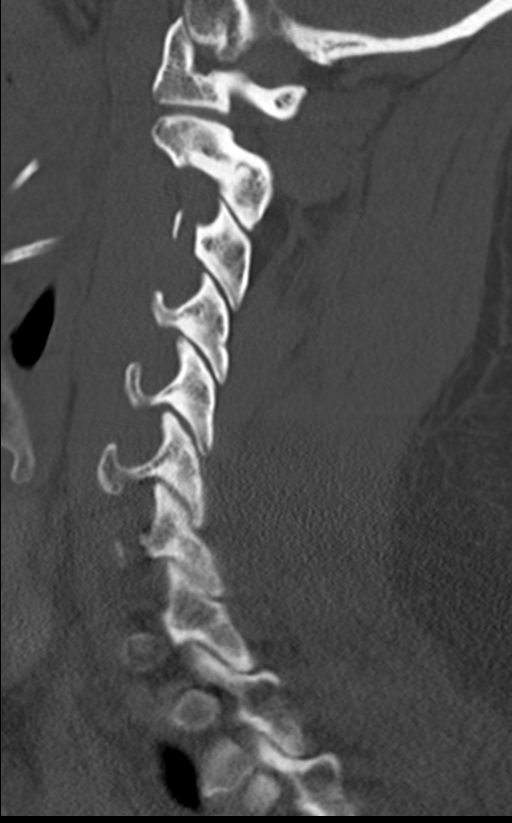

[Series 8: angled axial · axial · 0.22mm/px · z∈[+700,+828]mm · 3 of 97 slices shown, 4 images]
[im 17/97  soft-tissue]
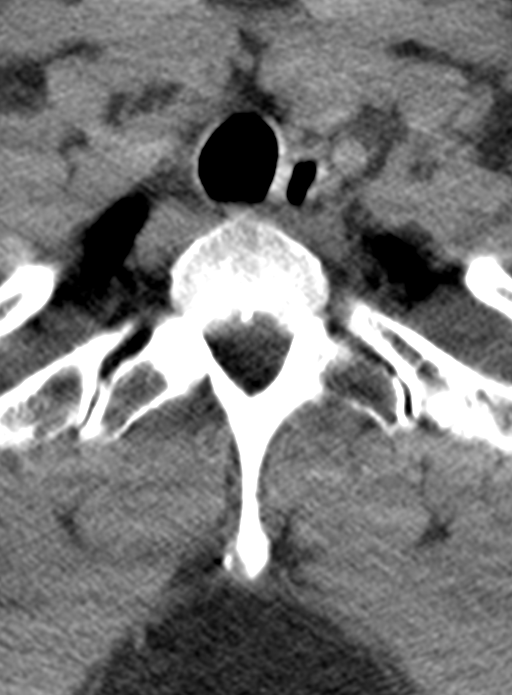
[im 17/97  bone]
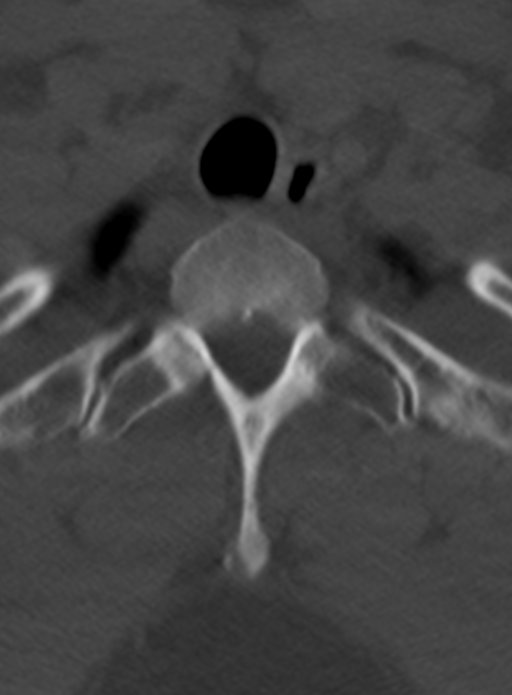
[im 49/97  bone]
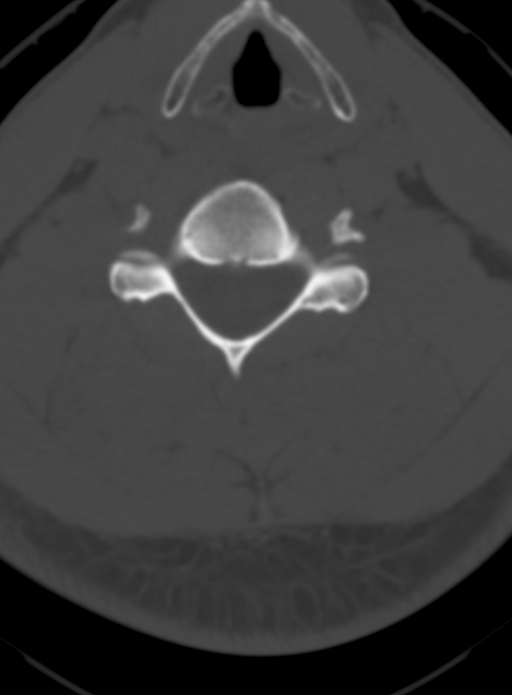
[im 81/97  bone]
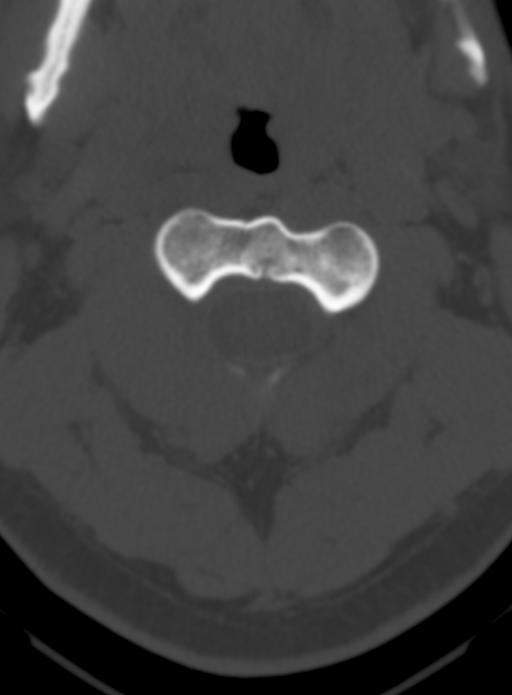

[11 of 33 positions shown; findings below may reference images not displayed]

FINDINGS: There is significant blurring of the fracture line involving the
right superior and inferior articulating facets of C6. The most
superior medial aspect of the fracture at C[DATE] not heal. The
remainder the fracture appears to be healing. There is no
displacement. Alignment is anatomic. Previously suspected C6
fracture is not visualized.

A remote fracture or ligamentous calcification at the posterior C7
spinous process is stable.

Vertebral body heights and alignment are otherwise maintained. No
other fracture is present. The soft tissues are unremarkable. The
lung apices are clear.
IMPRESSION: 1. Healing fracture within the right C7 superior and inferior
articulating facets.
2. No significant displacement or malalignment.
3. No new fractures.
4. The previously suspected C6 fracture is not present

## 2018-04-30 ENCOUNTER — Other Ambulatory Visit: Payer: Self-pay | Admitting: Family Medicine

## 2018-05-12 ENCOUNTER — Telehealth: Payer: Self-pay

## 2018-05-12 NOTE — Telephone Encounter (Signed)
Medication Samples have been provided to the patient.  Drug name:Novolog   Strength:100 units       Qty1  LOT: [id number]  Exp.Date: 07/2018  Dosing instructions: Per Dr. Jarrett Ables The patient has been instructed regarding the correct time, dose, and frequency of taking this medication, including desired effects and most common side effects.   Erick Alley 9:22 AM 05/12/2018

## 2018-06-02 ENCOUNTER — Ambulatory Visit (INDEPENDENT_AMBULATORY_CARE_PROVIDER_SITE_OTHER): Payer: BC Managed Care – PPO | Admitting: Family Medicine

## 2018-06-02 ENCOUNTER — Encounter: Payer: Self-pay | Admitting: Family Medicine

## 2018-06-02 DIAGNOSIS — I10 Essential (primary) hypertension: Secondary | ICD-10-CM

## 2018-06-02 DIAGNOSIS — N528 Other male erectile dysfunction: Secondary | ICD-10-CM

## 2018-06-02 DIAGNOSIS — E1159 Type 2 diabetes mellitus with other circulatory complications: Secondary | ICD-10-CM | POA: Diagnosis not present

## 2018-06-02 MED ORDER — SILDENAFIL CITRATE 100 MG PO TABS: 50.0000 mg | ORAL_TABLET | Freq: Every day | ORAL | 5 refills | Status: DC | PRN

## 2018-06-02 MED ORDER — INSULIN GLARGINE-LIXISENATIDE 100-33 UNT-MCG/ML ~~LOC~~ SOPN: 21.0000 [IU] | PEN_INJECTOR | Freq: Every day | SUBCUTANEOUS | 6 refills | Status: DC

## 2018-06-02 MED ORDER — INSULIN ASPART 100 UNIT/ML FLEXPEN: PEN_INJECTOR | SUBCUTANEOUS | 3 refills | Status: DC

## 2018-06-02 NOTE — Assessment & Plan Note (Signed)
Continue lisinopril 20 mg daily

## 2018-06-02 NOTE — Assessment & Plan Note (Signed)
Reported sugars are still above range.  Due to COVID-19 pandemic, we are currently not able to check an A1c.  Based on his fasting sugars, we will increase his Soliqua by 10% to 21 units daily.  Continue sliding scale NovoLog.  Continue metformin 1000 mg twice daily and Farxiga 10 mg daily.  Follow-up in 3 months.

## 2018-06-02 NOTE — Assessment & Plan Note (Signed)
We will switch from Cialis to Viagra.

## 2018-06-02 NOTE — Progress Notes (Signed)
    Chief Complaint:  Gene Weeks is a 43 y.o. male who presents today for a virtual office visit with a chief complaint of T2DM follow up.   Assessment/Plan:  Type 2 diabetes mellitus with vascular disease Reported sugars are still above range.  Due to COVID-19 pandemic, we are currently not able to check an A1c.  Based on his fasting sugars, we will increase his Soliqua by 10% to 21 units daily.  Continue sliding scale NovoLog.  Continue metformin 1000 mg twice daily and Farxiga 10 mg daily.  Follow-up in 3 months.  Essential hypertension Continue lisinopril 20 mg daily.  Erectile dysfunction We will switch from Cialis to Viagra.    Subjective:  HPI:  His stable, chronic medical conditions are outlined below:  # T2DM - Currently on farxiga 10mg  daily, metformin 1000mg  bid,  soliqua 19U at bedtime, and novolog SSI. Tolerating well without side effects. Often forgets to take [place].  Was having to use quite a bit of NovoLog.  Needs refill NovoLog today. - Home Sugars: 200s - ROS: No reported polyuria or polydipsia  # HTN - On lisinopril 20mg  daily. Tolerating well without side effects.  - ROS: No reported chest pain or shortness of breath  # ED - On cialis 10-20mg  q48hrs prn.  No side effects. - Does not think the medication is very effective.    ROS: Per HPI  PMH: He reports that he has never smoked. He has never used smokeless tobacco. He reports that he does not drink alcohol or use drugs.      Objective/Observations  Physical Exam: Gen: NAD, resting comfortably Pulm: Normal work of breathing Neuro: Grossly normal, moves all extremities Psych: Normal affect and thought content   Virtual Visit via Video   I connected with Benedetto Coons on 06/02/18 at  9:20 AM EDT by a video enabled telemedicine application and verified that I am speaking with the correct person using two identifiers. I discussed the limitations of evaluation and management by telemedicine and the  availability of in person appointments. The patient expressed understanding and agreed to proceed.   Patient location: Patient's private veheicle Provider location: [facility] [employer] Persons participating in the virtual visit: Myself and patient     [name]. [name], MD 06/02/2018 9:35 AM

## 2018-06-12 ENCOUNTER — Other Ambulatory Visit: Payer: Self-pay | Admitting: Family Medicine

## 2018-07-13 ENCOUNTER — Other Ambulatory Visit: Payer: Self-pay

## 2018-07-13 ENCOUNTER — Telehealth: Payer: Self-pay | Admitting: Family Medicine

## 2018-07-13 MED ORDER — DAPAGLIFLOZIN PROPANEDIOL 10 MG PO TABS: 10.0000 mg | ORAL_TABLET | Freq: Every day | ORAL | 3 refills | Status: DC

## 2018-07-13 MED ORDER — INSULIN ASPART 100 UNIT/ML FLEXPEN: PEN_INJECTOR | SUBCUTANEOUS | 3 refills | Status: DC

## 2018-07-13 MED ORDER — INSULIN GLARGINE-LIXISENATIDE 100-33 UNT-MCG/ML ~~LOC~~ SOPN: 21.0000 [IU] | PEN_INJECTOR | Freq: Every day | SUBCUTANEOUS | 6 refills | Status: DC

## 2018-07-13 MED ORDER — INSULIN PEN NEEDLE 32G X 6 MM MISC: 0 refills | Status: DC

## 2018-07-13 MED ORDER — METFORMIN HCL ER 500 MG PO TB24: 1000.0000 mg | ORAL_TABLET | Freq: Two times a day (BID) | ORAL | 0 refills | Status: DC

## 2018-07-13 NOTE — Telephone Encounter (Signed)
Pt advised that he needs refills on Farxiga, Metoformin, [place], and Novolog. Rxs refilled along with pen needles.

## 2018-07-13 NOTE — Telephone Encounter (Signed)
Called pt and left VM to call the office. Need to know if that's the only medication that he was needing refilled to [facility] instead of [facility].

## 2018-07-13 NOTE — Addendum Note (Signed)
Addended by: Dierdre Searles on: 07/13/2018 01:53 PM   Modules accepted: Orders

## 2018-07-13 NOTE — Telephone Encounter (Signed)
Pt came in office stating his refills needs to be sent to [facility] in [place] and not [facility]. Pt stated his last refills were sent to [facility] and it needs to be changed. Pt is out of insulin aspart (NOVOLOG FLEXPEN) 100 UNIT/ML FlexPen

## 2018-07-13 NOTE — Telephone Encounter (Signed)
Rx for Novolog sent to [facility] in [place].

## 2018-07-14 ENCOUNTER — Emergency Department (HOSPITAL_COMMUNITY): Payer: BC Managed Care – PPO

## 2018-07-14 ENCOUNTER — Other Ambulatory Visit: Payer: Self-pay

## 2018-07-14 ENCOUNTER — Emergency Department (HOSPITAL_COMMUNITY)
Admission: EM | Admit: 2018-07-14 | Discharge: 2018-07-15 | Disposition: A | Discharge status: Home / Self Care | Payer: BC Managed Care – PPO | Attending: Emergency Medicine | Admitting: Emergency Medicine

## 2018-07-14 DIAGNOSIS — M7918 Myalgia, other site: Secondary | ICD-10-CM | POA: Diagnosis present

## 2018-07-14 DIAGNOSIS — R51 Headache: Secondary | ICD-10-CM | POA: Diagnosis not present

## 2018-07-14 DIAGNOSIS — Z86718 Personal history of other venous thrombosis and embolism: Secondary | ICD-10-CM | POA: Insufficient documentation

## 2018-07-14 DIAGNOSIS — Z794 Long term (current) use of insulin: Secondary | ICD-10-CM | POA: Insufficient documentation

## 2018-07-14 DIAGNOSIS — I10 Essential (primary) hypertension: Secondary | ICD-10-CM | POA: Diagnosis not present

## 2018-07-14 DIAGNOSIS — Z8673 Personal history of transient ischemic attack (TIA), and cerebral infarction without residual deficits: Secondary | ICD-10-CM | POA: Insufficient documentation

## 2018-07-14 DIAGNOSIS — Z79899 Other long term (current) drug therapy: Secondary | ICD-10-CM | POA: Diagnosis not present

## 2018-07-14 DIAGNOSIS — Z20828 Contact with and (suspected) exposure to other viral communicable diseases: Secondary | ICD-10-CM | POA: Diagnosis not present

## 2018-07-14 DIAGNOSIS — R6883 Chills (without fever): Secondary | ICD-10-CM | POA: Insufficient documentation

## 2018-07-14 DIAGNOSIS — E119 Type 2 diabetes mellitus without complications: Secondary | ICD-10-CM | POA: Diagnosis not present

## 2018-07-14 DIAGNOSIS — B349 Viral infection, unspecified: Secondary | ICD-10-CM

## 2018-07-14 LAB — CBG MONITORING, ED: Glucose-Capillary: 151 mg/dL — ABNORMAL HIGH (ref 70–99)

## 2018-07-14 LAB — CBC WITH DIFFERENTIAL/PLATELET
Abs Immature Granulocytes: 0.02 10*3/uL (ref 0.00–0.07)
Basophils Absolute: 0 10*3/uL (ref 0.0–0.1)
Basophils Relative: 1 %
Eosinophils Absolute: 0.1 10*3/uL (ref 0.0–0.5)
Eosinophils Relative: 2 %
HCT: 46.8 % (ref 39.0–52.0)
Hemoglobin: 15.7 g/dL (ref 13.0–17.0)
Immature Granulocytes: 0 %
Lymphocytes Relative: 21 %
Lymphs Abs: 1.3 10*3/uL (ref 0.7–4.0)
MCH: 31.1 pg (ref 26.0–34.0)
MCHC: 33.5 g/dL (ref 30.0–36.0)
MCV: 92.7 fL (ref 80.0–100.0)
Monocytes Absolute: 0.5 10*3/uL (ref 0.1–1.0)
Monocytes Relative: 8 %
Neutro Abs: 4.2 10*3/uL (ref 1.7–7.7)
Neutrophils Relative %: 68 %
Platelets: 207 10*3/uL (ref 150–400)
RBC: 5.05 MIL/uL (ref 4.22–5.81)
RDW: 12.9 % (ref 11.5–15.5)
WBC: 6.1 10*3/uL (ref 4.0–10.5)
nRBC: 0 % (ref 0.0–0.2)

## 2018-07-14 LAB — URINALYSIS, ROUTINE W REFLEX MICROSCOPIC
Bacteria, UA: NONE SEEN
Bilirubin Urine: NEGATIVE
Glucose, UA: >=500 mg/dL — AB
Hgb urine dipstick: NEGATIVE
Ketones, ur: 20 mg/dL — AB
Leukocytes,Ua: NEGATIVE
Nitrite: NEGATIVE
Protein, ur: NEGATIVE mg/dL
Specific Gravity, Urine: 1.032 — ABNORMAL HIGH (ref 1.005–1.030)
pH: 5 (ref 5.0–8.0)

## 2018-07-14 LAB — COMPREHENSIVE METABOLIC PANEL
ALT: 60 U/L — ABNORMAL HIGH (ref 0–44)
AST: 32 U/L (ref 15–41)
Albumin: 4.4 g/dL (ref 3.5–5.0)
Alkaline Phosphatase: 82 U/L (ref 38–126)
Anion gap: 12 (ref 5–15)
BUN: 13 mg/dL (ref 6–20)
CO2: 22 mmol/L (ref 22–32)
Calcium: 9.4 mg/dL (ref 8.9–10.3)
Chloride: 102 mmol/L (ref 98–111)
Creatinine, Ser: 1.11 mg/dL (ref 0.61–1.24)
GFR calc Af Amer: >60 mL/min (ref >60)
GFR calc non Af Amer: >60 mL/min (ref >60)
Glucose, Bld: 170 mg/dL — ABNORMAL HIGH (ref 70–99)
Potassium: 3.8 mmol/L (ref 3.5–5.1)
Sodium: 136 mmol/L (ref 135–145)
Total Bilirubin: 1 mg/dL (ref 0.3–1.2)
Total Protein: 7.8 g/dL (ref 6.5–8.1)

## 2018-07-14 LAB — LACTIC ACID, PLASMA: Lactic Acid, Venous: 1.4 mmol/L (ref 0.5–1.9)

## 2018-07-14 LAB — SARS CORONAVIRUS 2 BY RT PCR (HOSPITAL ORDER, PERFORMED IN ~~LOC~~ HOSPITAL LAB): SARS Coronavirus 2: NEGATIVE

## 2018-07-14 MED ORDER — ACETAMINOPHEN 500 MG PO TABS
1000.0000 mg | ORAL_TABLET | Freq: Once | ORAL | Status: AC
  Administered 2018-07-14: 1000 mg via ORAL
  Filled 2018-07-14: qty 2

## 2018-07-14 MED ORDER — IBUPROFEN 400 MG PO TABS
400.0000 mg | ORAL_TABLET | Freq: Once | ORAL | Status: AC
  Administered 2018-07-15: 400 mg via ORAL
  Filled 2018-07-14: qty 1

## 2018-07-14 NOTE — ED Triage Notes (Signed)
Pt c/o fever, generalized body aches and shortness of breath. 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

## 2018-07-15 ENCOUNTER — Telehealth: Payer: Self-pay | Admitting: Family Medicine

## 2018-07-15 ENCOUNTER — Other Ambulatory Visit: Payer: Self-pay

## 2018-07-15 MED ORDER — INSULIN PEN NEEDLE 29G X 12.7MM MISC: 1.0000 | Freq: Every day | 11 refills | Status: DC
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
## 2018-07-15 NOTE — Telephone Encounter (Signed)
Patient was evaluated in the ED and tested negative for COVID-19.

## 2018-07-15 NOTE — ED Notes (Signed)
Patient verbalizes understanding of discharge instructions. Opportunity for questioning and answers were provided. Armband removed by staff, pt discharged from ED ambulatory and drove self home   

## 2018-07-15 NOTE — Telephone Encounter (Signed)
[facility] - [facility] TELEPHONE ADVICE RECORD [facility] Patient Name: Gene Weeks Gender: Male DOB: [birth year]  Age: 43 Y 23 D Return Phone Number: [phone] (Primary) Address:  City/State/Zip: [place]  [address] Client [facility] Physician Jacquiline Doe- MD Contact Type Call Who Is Calling Patient / Member / Family / Caregiver Call Type Triage / Clinical Relationship To Patient Self Return Phone Number [phone] (Primary) Chief Complaint BREATHING - shortness of breath or sounds breathless Reason for Call Symptomatic / Request for Health Information Initial Comment Caller states he has been having body aches, chills, fever 101.2, hard to breath and headache. Translation No Nurse Assessment Nurse: Isabell Jarvis, RN, Talbert Forest Date/Time [name] Time): 07/14/2018 8:48:45 PM Confirm and document reason for call. If symptomatic, describe symptoms. ---Caller states he has been having body aches, headache, nasal congestion, and mild SOB. These symptoms started this am. Temp 101.2 orally. Tx with Tylenol Severe Cold and Flu. Has the patient had close contact with a person known or suspected to have the novel coronavirus illness OR traveled / lives in area with major community spread (including international travel) in the last 14 days from the onset of symptoms? * If Asymptomatic, screen for exposure and travel within the last 14 days. ---No Does the patient have any new or worsening symptoms? ---Yes Will a triage be completed? ---Yes Related visit to physician within the last 2 weeks? ---No Does the PT have any chronic conditions? (i.e. diabetes, asthma, this includes High risk factors for pregnancy, etc.) ---Yes List chronic conditions. ---Diabetes, Insulin Dependent Is this a behavioral health or substance abuse call? ---No Guidelines Guideline  Title Affirmed Question Affirmed Notes Nurse Date/Time (Eastern Time) Headache [1] SEVERE headache AND [2] fever  Isabell Jarvis, RN, Talbert Forest 07/14/2018 8:54:00 PM Disp. Time [name] Time) Disposition Final User 07/14/2018 8:45:33 PM Send to Urgent Alba Destine, Porfirio Mylar PLEASE NOTE:  All timestamps contained within this report are represented as [place] Standard Time. CONFIDENTIALTY NOTICE: This fax transmission is intended only for the addressee.  It contains information that is legally privileged, confidential or otherwise protected from use or disclosure.  If you are not the intended recipient, you are strictly prohibited from reviewing, disclosing, copying using or disseminating any of this information or taking any action in reliance on or regarding this information.  If you have received this fax in error, please notify us immediately by telephone so that we can arrange for its return to [place]. Phone:  [phone], Toll-Free:  [phone], Fax:  [phone] Page: 2 of 2 Call Id: [id number] 07/14/2018 8:59:25 PM Go to ED Now (or PCP triage) Yes Isabell Jarvis, RN, Elveria Rising Disagree/Comply Comply Caller Understands Yes PreDisposition Home Care Care Advice Given Per Guideline GO TO ED NOW (OR PCP TRIAGE): DRIVING: Another adult should drive. CARE ADVICE given per Headache (Adult) guideline. Referrals [facility] - ED

## 2018-07-19 LAB — CULTURE, BLOOD (ROUTINE X 2)
Culture: NO GROWTH
Culture: NO GROWTH
Special Requests: ADEQUATE

## 2018-08-16 ENCOUNTER — Other Ambulatory Visit: Payer: Self-pay | Admitting: Family Medicine

## 2018-08-23 ENCOUNTER — Other Ambulatory Visit: Payer: Self-pay | Admitting: Family Medicine

## 2018-09-08 ENCOUNTER — Telehealth: Payer: Self-pay | Admitting: *Deleted

## 2018-09-08 NOTE — Telephone Encounter (Signed)
Copied from [place]. Topic: General - Other >> Sep 08, 2018  1:50 PM Pauline Good wrote: Reason for CRM: pt was on a plane from [place] to [place] and want to get tested for covid. No exposure or symptoms

## 2018-09-10 NOTE — Telephone Encounter (Signed)
Left voice message for patient to call clinic if any questions.Also we are not able to test due to patient not having symptoms or exposure.

## 2018-09-11 ENCOUNTER — Other Ambulatory Visit: Payer: Self-pay | Admitting: Internal Medicine

## 2018-09-11 DIAGNOSIS — Z20822 Contact with and (suspected) exposure to covid-19: Secondary | ICD-10-CM

## 2018-09-13 LAB — NOVEL CORONAVIRUS, NAA: SARS-CoV-2, NAA: NOT DETECTED

## 2018-09-18 ENCOUNTER — Telehealth: Payer: Self-pay | Admitting: Family Medicine

## 2018-09-18 NOTE — Telephone Encounter (Signed)
Patient called in and received his covid results. °

## 2018-10-28 ENCOUNTER — Other Ambulatory Visit: Payer: Self-pay | Admitting: Family Medicine

## 2018-12-12 ENCOUNTER — Other Ambulatory Visit: Payer: Self-pay | Admitting: Family Medicine

## 2019-01-15 ENCOUNTER — Other Ambulatory Visit: Payer: Self-pay

## 2019-01-15 DIAGNOSIS — Z20822 Contact with and (suspected) exposure to covid-19: Secondary | ICD-10-CM

## 2019-01-18 LAB — NOVEL CORONAVIRUS, NAA: SARS-CoV-2, NAA: NOT DETECTED

## 2019-01-31 ENCOUNTER — Other Ambulatory Visit: Payer: Self-pay | Admitting: Family Medicine

## 2019-02-17 ENCOUNTER — Encounter: Payer: Self-pay | Admitting: Physician Assistant

## 2019-02-17 ENCOUNTER — Ambulatory Visit (INDEPENDENT_AMBULATORY_CARE_PROVIDER_SITE_OTHER): Payer: BC Managed Care – PPO | Admitting: Physician Assistant

## 2019-02-17 ENCOUNTER — Other Ambulatory Visit: Payer: Self-pay

## 2019-02-17 VITALS — Temp 100.5°F | Ht 77.0 in | Wt 285.0 lb

## 2019-02-17 DIAGNOSIS — R05 Cough: Secondary | ICD-10-CM | POA: Diagnosis not present

## 2019-02-17 DIAGNOSIS — Z7189 Other specified counseling: Secondary | ICD-10-CM | POA: Diagnosis not present

## 2019-02-17 DIAGNOSIS — R059 Cough, unspecified: Secondary | ICD-10-CM

## 2019-02-17 MED ORDER — AZITHROMYCIN 250 MG PO TABS: ORAL_TABLET | ORAL | 0 refills | Status: DC

## 2019-02-17 MED ORDER — BENZONATATE 200 MG PO CAPS: 200.0000 mg | ORAL_CAPSULE | Freq: Two times a day (BID) | ORAL | 0 refills | Status: DC | PRN
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
## 2019-03-15 ENCOUNTER — Other Ambulatory Visit: Payer: Self-pay | Admitting: Family Medicine

## 2019-03-16 ENCOUNTER — Other Ambulatory Visit: Payer: Self-pay | Admitting: Family Medicine

## 2019-04-06 NOTE — Telephone Encounter (Signed)
Error

## 2019-04-29 ENCOUNTER — Other Ambulatory Visit: Payer: Self-pay | Admitting: Family Medicine

## 2019-05-04 ENCOUNTER — Other Ambulatory Visit: Payer: Self-pay | Admitting: *Deleted

## 2019-05-04 MED ORDER — METFORMIN HCL ER 500 MG PO TB24: 1000.0000 mg | ORAL_TABLET | Freq: Two times a day (BID) | ORAL | 0 refills | Status: DC

## 2019-06-16 ENCOUNTER — Other Ambulatory Visit: Payer: Self-pay | Admitting: Family Medicine

## 2019-06-21 ENCOUNTER — Other Ambulatory Visit: Payer: Self-pay | Admitting: *Deleted

## 2019-06-21 MED ORDER — SOLIQUA 100-33 UNT-MCG/ML ~~LOC~~ SOPN: 21.0000 [IU] | PEN_INJECTOR | Freq: Every day | SUBCUTANEOUS | 6 refills | Status: DC

## 2019-07-20 ENCOUNTER — Other Ambulatory Visit: Payer: Self-pay | Admitting: *Deleted

## 2019-07-20 MED ORDER — DAPAGLIFLOZIN PROPANEDIOL 10 MG PO TABS: 10.0000 mg | ORAL_TABLET | Freq: Every day | ORAL | 3 refills | Status: DC

## 2019-07-21 ENCOUNTER — Other Ambulatory Visit: Payer: Self-pay | Admitting: Family Medicine

## 2019-07-21 NOTE — Telephone Encounter (Signed)
LVM to schedule appointment with Dr [name]   Pt needs OV

## 2019-07-28 ENCOUNTER — Other Ambulatory Visit: Payer: Self-pay | Admitting: Family Medicine

## 2019-08-01 ENCOUNTER — Other Ambulatory Visit: Payer: Self-pay | Admitting: Family Medicine

## 2019-09-08 ENCOUNTER — Other Ambulatory Visit: Payer: Self-pay | Admitting: Family Medicine

## 2019-10-20 ENCOUNTER — Other Ambulatory Visit: Payer: Self-pay | Admitting: Family Medicine

## 2019-10-23 ENCOUNTER — Other Ambulatory Visit: Payer: Self-pay | Admitting: Family Medicine

## 2019-10-30 ENCOUNTER — Other Ambulatory Visit: Payer: Self-pay | Admitting: Family Medicine

## 2019-11-01 ENCOUNTER — Other Ambulatory Visit: Payer: Self-pay | Admitting: Family Medicine

## 2019-11-26 ENCOUNTER — Other Ambulatory Visit: Payer: Self-pay | Admitting: Family Medicine

## 2019-12-16 ENCOUNTER — Other Ambulatory Visit: Payer: Self-pay | Admitting: Family Medicine

## 2020-01-06 ENCOUNTER — Other Ambulatory Visit: Payer: Self-pay | Admitting: Family Medicine

## 2020-01-25 ENCOUNTER — Other Ambulatory Visit: Payer: Self-pay | Admitting: Family Medicine

## 2020-01-25 NOTE — Telephone Encounter (Signed)
30 day supply sent in. Needs OV for refills. He has not been here in over a year and a half.

## 2020-01-25 NOTE — Telephone Encounter (Signed)
LAST APPOINTMENT DATE: 06/02/2018  NEXT APPOINTMENT DATE: Visit date not found    LAST REFILL:10/25/2019  QTY:90

## 2020-02-04 ENCOUNTER — Other Ambulatory Visit: Payer: Self-pay | Admitting: Family Medicine

## 2020-02-19 ENCOUNTER — Other Ambulatory Visit: Payer: Self-pay | Admitting: Family Medicine

## 2020-02-22 ENCOUNTER — Other Ambulatory Visit: Payer: Self-pay | Admitting: Family Medicine

## 2020-02-23 ENCOUNTER — Other Ambulatory Visit: Payer: Self-pay | Admitting: Family Medicine

## 2020-02-28 ENCOUNTER — Other Ambulatory Visit: Payer: Self-pay | Admitting: Family Medicine

## 2020-03-02 ENCOUNTER — Other Ambulatory Visit: Payer: Self-pay | Admitting: Family Medicine

## 2020-03-03 ENCOUNTER — Telehealth: Payer: Self-pay

## 2020-03-03 NOTE — Telephone Encounter (Signed)
  LAST APPOINTMENT DATE: 05/2018   NEXT APPOINTMENT DATE: 03/21/20  MEDICATION: sildenafil (VIAGRA) 100 MG tablet,NOVOLOG FLEXPEN 100 UNIT/ML FlexPen    PHARMACY: [facility] [place], [place] - [address] // [facility] [phone] - [place], [place] - [address] [place] [address] [place] [address]

## 2020-03-03 NOTE — Telephone Encounter (Signed)
Novolog refill send to [facility] pharmacy

## 2020-03-06 NOTE — Telephone Encounter (Signed)
Ok with me. Please place any necessary orders. 

## 2020-03-21 ENCOUNTER — Ambulatory Visit (INDEPENDENT_AMBULATORY_CARE_PROVIDER_SITE_OTHER): Payer: BC Managed Care – PPO | Admitting: Family Medicine

## 2020-03-21 ENCOUNTER — Encounter: Payer: Self-pay | Admitting: Family Medicine

## 2020-03-21 VITALS — BP 127/76 | HR 94 | Temp 97.3°F | Ht 77.0 in | Wt 290.4 lb

## 2020-03-21 DIAGNOSIS — Z0001 Encounter for general adult medical examination with abnormal findings: Secondary | ICD-10-CM | POA: Diagnosis not present

## 2020-03-21 DIAGNOSIS — I10 Essential (primary) hypertension: Secondary | ICD-10-CM | POA: Diagnosis not present

## 2020-03-21 DIAGNOSIS — Z13220 Encounter for screening for lipoid disorders: Secondary | ICD-10-CM

## 2020-03-21 DIAGNOSIS — Z23 Encounter for immunization: Secondary | ICD-10-CM | POA: Diagnosis not present

## 2020-03-21 DIAGNOSIS — E1159 Type 2 diabetes mellitus with other circulatory complications: Secondary | ICD-10-CM | POA: Diagnosis not present

## 2020-03-21 DIAGNOSIS — N528 Other male erectile dysfunction: Secondary | ICD-10-CM | POA: Diagnosis not present

## 2020-03-21 DIAGNOSIS — E669 Obesity, unspecified: Secondary | ICD-10-CM

## 2020-03-21 DIAGNOSIS — Z6834 Body mass index (BMI) 34.0-34.9, adult: Secondary | ICD-10-CM

## 2020-03-21 LAB — CBC
HCT: 44.3 % (ref 39.0–52.0)
Hemoglobin: 15.1 g/dL (ref 13.0–17.0)
MCHC: 34.1 g/dL (ref 30.0–36.0)
MCV: 94.9 fl (ref 78.0–100.0)
Platelets: 202 10*3/uL (ref 150.0–400.0)
RBC: 4.67 Mil/uL (ref 4.22–5.81)
RDW: 13.1 % (ref 11.5–15.5)
WBC: 6.6 10*3/uL (ref 4.0–10.5)

## 2020-03-21 LAB — COMPREHENSIVE METABOLIC PANEL
ALT: 27 U/L (ref 0–53)
AST: 26 U/L (ref 0–37)
Albumin: 4.4 g/dL (ref 3.5–5.2)
Alkaline Phosphatase: 68 U/L (ref 39–117)
BUN: 16 mg/dL (ref 6–23)
CO2: 26 mEq/L (ref 19–32)
Calcium: 9.7 mg/dL (ref 8.4–10.5)
Chloride: 105 mEq/L (ref 96–112)
Creatinine, Ser: 1.11 mg/dL (ref 0.40–1.50)
GFR: 80.63 mL/min (ref >60.00)
Glucose, Bld: 86 mg/dL (ref 70–99)
Potassium: 3.9 mEq/L (ref 3.5–5.1)
Sodium: 139 mEq/L (ref 135–145)
Total Bilirubin: 0.5 mg/dL (ref 0.2–1.2)
Total Protein: 7.2 g/dL (ref 6.0–8.3)

## 2020-03-21 LAB — LIPID PANEL
Cholesterol: 172 mg/dL (ref 0–200)
HDL: 55 mg/dL (ref >39.00)
LDL Cholesterol: 84 mg/dL (ref 0–99)
NonHDL: 117.07
Total CHOL/HDL Ratio: 3
Triglycerides: 167 mg/dL — ABNORMAL HIGH (ref 0.0–149.0)
VLDL: 33.4 mg/dL (ref 0.0–40.0)

## 2020-03-21 LAB — HEMOGLOBIN A1C: Hgb A1c MFr Bld: 10.1 % — ABNORMAL HIGH (ref 4.6–6.5)

## 2020-03-21 LAB — TSH: TSH: 0.78 u[IU]/mL (ref 0.35–4.50)

## 2020-03-21 MED ORDER — TADALAFIL 20 MG PO TABS: 10.0000 mg | ORAL_TABLET | ORAL | 5 refills | Status: DC | PRN

## 2020-03-21 MED ORDER — LISINOPRIL 20 MG PO TABS: ORAL_TABLET | ORAL | 3 refills | Status: DC

## 2020-03-21 MED ORDER — METFORMIN HCL ER 500 MG PO TB24: ORAL_TABLET | ORAL | 3 refills | Status: DC

## 2020-03-21 MED ORDER — SOLIQUA 100-33 UNT-MCG/ML ~~LOC~~ SOPN: 21.0000 [IU] | PEN_INJECTOR | Freq: Every day | SUBCUTANEOUS | 5 refills | Status: DC

## 2020-03-21 MED ORDER — SILDENAFIL CITRATE 100 MG PO TABS: 50.0000 mg | ORAL_TABLET | Freq: Every day | ORAL | 5 refills | Status: DC | PRN

## 2020-03-21 NOTE — Patient Instructions (Signed)
It was very nice to see you today!  We will check blood work today.  We will give your flu vaccine.  We can give you the pneumonia vaccine You are here.  Please try watching the sweets and starches.  We will check blood work today.  May need to adjust your medications depending on results of your A1c.  I will see you back in 3 to 6 months depending on your blood work.  Take care, Dr [name]  Please try these tips to maintain a healthy lifestyle:   Eat at least 3 REAL meals and 1-2 snacks per day.  Aim for no more than 5 hours between eating.  If you eat breakfast, please do so within one hour of getting up.    Each meal should contain half fruits/vegetables, one quarter protein, and one quarter carbs (no bigger than a computer mouse)   Cut down on sweet beverages. This includes juice, soda, and sweet tea.     Drink at least 1 glass of water with each meal and aim for at least 8 glasses per day   Exercise at least 150 minutes every week.    Preventive Care 51-40 Years Old, Male Preventive care refers to lifestyle choices and visits with your health care provider that can promote health and wellness. This includes:  A yearly physical exam. This is also called an annual wellness visit.  Regular dental and eye exams.  Immunizations.  Screening for certain conditions.  Healthy lifestyle choices, such as: ? Eating a healthy diet. ? Getting regular exercise. ? Not using drugs or products that contain nicotine and tobacco. ? Limiting alcohol use. What can I expect for my preventive care visit? Physical exam Your health care provider will check your:  Height and weight. These may be used to calculate your BMI (body mass index). BMI is a measurement that tells if you are at a healthy weight.  Heart rate and blood pressure.  Body temperature.  Skin for abnormal spots. Counseling Your health care provider may ask you questions about your:  Past medical  problems.  Family's medical history.  Alcohol, tobacco, and drug use.  Emotional well-being.  Home life and relationship well-being.  Sexual activity.  Diet, exercise, and sleep habits.  Work and work Astronomer.  Access to firearms. What immunizations do I need? Vaccines are usually given at various ages, according to a schedule. Your health care provider will recommend vaccines for you based on your age, medical history, and lifestyle or other factors, such as travel or where you work.   What tests do I need? Blood tests  Lipid and cholesterol levels. These may be checked every 5 years, or more often if you are over 55 years old.  Hepatitis C test.  Hepatitis B test. Screening  Lung cancer screening. You may have this screening every year starting at age 15 if you have a 30-pack-year history of smoking and currently smoke or have quit within the past 15 years.  Prostate cancer screening. Recommendations will vary depending on your family history and other risks.  Genital exam to check for testicular cancer or hernias.  Colorectal cancer screening. ? All adults should have this screening starting at age 82 and continuing until age 100. ? Your health care provider may recommend screening at age 51 if you are at increased risk. ? You will have tests every 1-10 years, depending on your results and the type of screening test.  Diabetes screening. ? This is done  by checking your blood sugar (glucose) after you have not eaten for a while (fasting). ? You may have this done every 1-3 years.  STD (sexually transmitted disease) testing, if you are at risk. Follow these instructions at home: Eating and drinking  Eat a diet that includes fresh fruits and vegetables, whole grains, lean protein, and low-fat dairy products.  Take vitamin and mineral supplements as recommended by your health care provider.  Do not drink alcohol if your health care provider tells you not to  drink.  If you drink alcohol: ? Limit how much you have to 0-2 drinks a day. ? Be aware of how much alcohol is in your drink. In the [place] one drink equals one 12 oz bottle of beer (355 mL), one 5 oz glass of wine (148 mL), or one 1 oz glass of hard liquor (44 mL).   Lifestyle  Take daily care of your teeth and gums. Brush your teeth every morning and night with fluoride toothpaste. Floss one time each day.  Stay active. Exercise for at least 30 minutes 5 or more days each week.  Do not use any products that contain nicotine or tobacco, such as cigarettes, e-cigarettes, and chewing tobacco. If you need help quitting, ask your health care provider.  Do not use drugs.  If you are sexually active, practice safe sex. Use a condom or other form of protection to prevent STIs (sexually transmitted infections).  If told by your health care provider, take low-dose aspirin daily starting at age 52.  Find healthy ways to cope with stress, such as: ? Meditation, yoga, or listening to music. ? Journaling. ? Talking to a trusted person. ? Spending time with friends and family. Safety  Always wear your seat belt while driving or riding in a vehicle.  Do not drive: ? If you have been drinking alcohol. Do not ride with someone who has been drinking. ? When you are tired or distracted. ? While texting.  Wear a helmet and other protective equipment during sports activities.  If you have firearms in your house, make sure you follow all gun safety procedures. What's next?  Go to your health care provider once a year for an annual wellness visit.  Ask your health care provider how often you should have your eyes and teeth checked.  Stay up to date on all vaccines. This information is not intended to replace advice given to you by your health care provider. Make sure you discuss any questions you have with your health care provider. Document Revised: 11/10/2018 Document Reviewed:  02/05/2018 Elsevier Patient Education  2021 [employer].

## 2020-03-21 NOTE — Assessment & Plan Note (Addendum)
Check A1c. He has been compliant with medications.  We will continue current regimen of Soliqua 21 units daily, Metformin 1000 mg twice daily, and Farxiga 10 mg daily.  Will likely need to recheck A1c in 3 to 6 months.

## 2020-03-21 NOTE — Assessment & Plan Note (Signed)
Stable.  Continue lisinopril 20 mg daily.  Check labs today.

## 2020-03-21 NOTE — Assessment & Plan Note (Signed)
Stable.  Will refill Viagra and Cialis.
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
## 2020-03-22 NOTE — Progress Notes (Signed)
Please inform patient of the following:  A1c is up. Would like for him to increase soliqua to 24 units. Keep everything else the same. We can recheck in 3 months.  [name]. [name], MD 03/22/2020 12:00 PM

## 2020-03-24 ENCOUNTER — Other Ambulatory Visit: Payer: Self-pay | Admitting: *Deleted

## 2020-03-24 MED ORDER — INSULIN PEN NEEDLE 29G X 12.7MM MISC: 1.0000 | Freq: Every day | 11 refills | Status: DC

## 2020-03-24 MED ORDER — SOLIQUA 100-33 UNT-MCG/ML ~~LOC~~ SOPN: 21.0000 [IU] | PEN_INJECTOR | Freq: Every day | SUBCUTANEOUS | 1 refills | Status: DC

## 2020-05-21 ENCOUNTER — Other Ambulatory Visit: Payer: Self-pay | Admitting: Family Medicine

## 2020-06-05 ENCOUNTER — Other Ambulatory Visit: Payer: Self-pay | Admitting: Family Medicine

## 2020-06-23 ENCOUNTER — Other Ambulatory Visit: Payer: Self-pay

## 2020-06-23 ENCOUNTER — Ambulatory Visit: Payer: BC Managed Care – PPO | Admitting: Registered Nurse

## 2020-06-23 ENCOUNTER — Encounter: Payer: Self-pay | Admitting: Registered Nurse

## 2020-06-23 DIAGNOSIS — M542 Cervicalgia: Secondary | ICD-10-CM | POA: Diagnosis not present

## 2020-06-23 DIAGNOSIS — M791 Myalgia, unspecified site: Secondary | ICD-10-CM | POA: Diagnosis not present

## 2020-06-23 DIAGNOSIS — R519 Headache, unspecified: Secondary | ICD-10-CM | POA: Diagnosis not present

## 2020-06-23 DIAGNOSIS — M549 Dorsalgia, unspecified: Secondary | ICD-10-CM

## 2020-06-23 MED ORDER — CYCLOBENZAPRINE HCL 10 MG PO TABS: 10.0000 mg | ORAL_TABLET | Freq: Three times a day (TID) | ORAL | 0 refills | Status: DC | PRN

## 2020-06-23 MED ORDER — TRAMADOL HCL 50 MG PO TABS: 50.0000 mg | ORAL_TABLET | Freq: Three times a day (TID) | ORAL | 0 refills | Status: AC | PRN

## 2020-06-23 MED ORDER — DICLOFENAC SODIUM 75 MG PO TBEC: 75.0000 mg | DELAYED_RELEASE_TABLET | Freq: Two times a day (BID) | ORAL | 0 refills | Status: DC
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
## 2020-06-27 ENCOUNTER — Encounter: Payer: Self-pay | Admitting: Physical Therapy

## 2020-06-27 ENCOUNTER — Ambulatory Visit (INDEPENDENT_AMBULATORY_CARE_PROVIDER_SITE_OTHER): Payer: BC Managed Care – PPO | Admitting: Physical Therapy

## 2020-06-27 ENCOUNTER — Other Ambulatory Visit: Payer: Self-pay

## 2020-06-27 DIAGNOSIS — M6281 Muscle weakness (generalized): Secondary | ICD-10-CM

## 2020-06-27 DIAGNOSIS — R262 Difficulty in walking, not elsewhere classified: Secondary | ICD-10-CM

## 2020-06-27 DIAGNOSIS — M545 Low back pain, unspecified: Secondary | ICD-10-CM

## 2020-06-27 DIAGNOSIS — M542 Cervicalgia: Secondary | ICD-10-CM

## 2020-06-27 DIAGNOSIS — M25512 Pain in left shoulder: Secondary | ICD-10-CM
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
## 2020-06-27 NOTE — Patient Instructions (Signed)
Access Code: [email or web address] URL: [email or web address] Date: 06/27/2020 Prepared by: Zebedee Iba  Exercises Supine Lower Trunk Rotation - 2 x daily - 7 x weekly - 2 sets - 10 reps - 2 hold Seated Scapular Retraction - 2 x daily - 7 x weekly - 2 sets - 10 reps Gentle Levator Scapulae Stretch - 2 x daily - 7 x weekly - 1 sets - 3 reps

## 2020-06-29 ENCOUNTER — Encounter: Payer: Self-pay | Admitting: Physical Therapy

## 2020-06-29 ENCOUNTER — Ambulatory Visit (INDEPENDENT_AMBULATORY_CARE_PROVIDER_SITE_OTHER): Payer: BC Managed Care – PPO | Admitting: Physical Therapy

## 2020-06-29 DIAGNOSIS — M545 Low back pain, unspecified: Secondary | ICD-10-CM | POA: Diagnosis not present

## 2020-06-29 DIAGNOSIS — M542 Cervicalgia: Secondary | ICD-10-CM | POA: Diagnosis not present

## 2020-06-29 DIAGNOSIS — M6281 Muscle weakness (generalized): Secondary | ICD-10-CM

## 2020-06-29 DIAGNOSIS — M25512 Pain in left shoulder: Secondary | ICD-10-CM

## 2020-06-29 DIAGNOSIS — R262 Difficulty in walking, not elsewhere classified: Secondary | ICD-10-CM

## 2020-06-29 NOTE — Patient Instructions (Signed)
Access Code: [email or web address] URL: [email or web address] Date: 06/29/2020 Prepared by: Zebedee Iba  Exercises Supine Lower Trunk Rotation - 2 x daily - 7 x weekly - 2 sets - 10 reps - 2 hold Seated Shoulder Shrugs - 2 x daily - 7 x weekly - 2 sets - 10 reps Seated Cervical Rotation AROM - 2 x daily - 7 x weekly - 2 sets - 10 reps
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
## 2020-06-30 ENCOUNTER — Encounter: Payer: BC Managed Care – PPO | Admitting: Physical Therapy

## 2020-07-03 ENCOUNTER — Ambulatory Visit (INDEPENDENT_AMBULATORY_CARE_PROVIDER_SITE_OTHER): Payer: BC Managed Care – PPO | Admitting: Physical Therapy

## 2020-07-03 ENCOUNTER — Other Ambulatory Visit: Payer: Self-pay

## 2020-07-03 ENCOUNTER — Encounter: Payer: Self-pay | Admitting: Physical Therapy

## 2020-07-03 DIAGNOSIS — M25512 Pain in left shoulder: Secondary | ICD-10-CM

## 2020-07-03 DIAGNOSIS — M545 Low back pain, unspecified: Secondary | ICD-10-CM | POA: Diagnosis not present

## 2020-07-03 DIAGNOSIS — R262 Difficulty in walking, not elsewhere classified: Secondary | ICD-10-CM

## 2020-07-03 DIAGNOSIS — M6281 Muscle weakness (generalized): Secondary | ICD-10-CM | POA: Diagnosis not present

## 2020-07-03 DIAGNOSIS — M542 Cervicalgia: Secondary | ICD-10-CM
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
## 2020-07-05 ENCOUNTER — Other Ambulatory Visit: Payer: Self-pay | Admitting: Registered Nurse

## 2020-07-06 ENCOUNTER — Encounter: Payer: Self-pay | Admitting: Physical Therapy

## 2020-07-06 ENCOUNTER — Other Ambulatory Visit: Payer: Self-pay

## 2020-07-06 ENCOUNTER — Ambulatory Visit (INDEPENDENT_AMBULATORY_CARE_PROVIDER_SITE_OTHER): Payer: BC Managed Care – PPO | Admitting: Physical Therapy

## 2020-07-06 DIAGNOSIS — M542 Cervicalgia: Secondary | ICD-10-CM | POA: Diagnosis not present

## 2020-07-06 DIAGNOSIS — M25512 Pain in left shoulder: Secondary | ICD-10-CM

## 2020-07-06 DIAGNOSIS — R262 Difficulty in walking, not elsewhere classified: Secondary | ICD-10-CM

## 2020-07-06 DIAGNOSIS — M545 Low back pain, unspecified: Secondary | ICD-10-CM

## 2020-07-06 DIAGNOSIS — M6281 Muscle weakness (generalized): Secondary | ICD-10-CM
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
## 2020-07-12 ENCOUNTER — Other Ambulatory Visit: Payer: Self-pay

## 2020-07-12 ENCOUNTER — Ambulatory Visit (INDEPENDENT_AMBULATORY_CARE_PROVIDER_SITE_OTHER): Payer: BC Managed Care – PPO | Admitting: Physical Therapy

## 2020-07-12 ENCOUNTER — Encounter: Payer: Self-pay | Admitting: Physical Therapy

## 2020-07-12 DIAGNOSIS — R262 Difficulty in walking, not elsewhere classified: Secondary | ICD-10-CM

## 2020-07-12 DIAGNOSIS — M542 Cervicalgia: Secondary | ICD-10-CM | POA: Diagnosis not present

## 2020-07-12 DIAGNOSIS — M25512 Pain in left shoulder: Secondary | ICD-10-CM

## 2020-07-12 DIAGNOSIS — M6281 Muscle weakness (generalized): Secondary | ICD-10-CM | POA: Diagnosis not present

## 2020-07-12 DIAGNOSIS — M545 Low back pain, unspecified: Secondary | ICD-10-CM
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
## 2020-07-14 ENCOUNTER — Other Ambulatory Visit: Payer: Self-pay

## 2020-07-14 ENCOUNTER — Ambulatory Visit (INDEPENDENT_AMBULATORY_CARE_PROVIDER_SITE_OTHER): Payer: BC Managed Care – PPO | Admitting: Physical Therapy

## 2020-07-14 DIAGNOSIS — M542 Cervicalgia: Secondary | ICD-10-CM | POA: Diagnosis not present

## 2020-07-14 DIAGNOSIS — R262 Difficulty in walking, not elsewhere classified: Secondary | ICD-10-CM

## 2020-07-14 DIAGNOSIS — M545 Low back pain, unspecified: Secondary | ICD-10-CM | POA: Diagnosis not present

## 2020-07-14 DIAGNOSIS — M25512 Pain in left shoulder: Secondary | ICD-10-CM

## 2020-07-14 DIAGNOSIS — M6281 Muscle weakness (generalized): Secondary | ICD-10-CM | POA: Diagnosis not present
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
## 2020-07-14 NOTE — Patient Instructions (Signed)
Access Code: [email or web address] URL: [email or web address] Date: 07/14/2020 Prepared by: Zebedee Iba  Exercises Supine Lower Trunk Rotation - 2 x daily - 7 x weekly - 2 sets - 10 reps - 2 hold Seated Shoulder Shrugs - 2 x daily - 7 x weekly - 2 sets - 10 reps Seated Cervical Rotation AROM - 2 x daily - 7 x weekly - 2 sets - 10 reps Supine Posterior Pelvic Tilt - 1 x daily - 7 x weekly - 2 sets - 10 reps Standing Isometric Shoulder External Rotation with Doorway - 2 x daily - 7 x weekly - 1 sets - 10 reps - 5 hold Standing Isometric Shoulder Internal Rotation at Doorway - 2 x daily - 7 x weekly - 1 sets - 10 reps - 5 hold Flexion-Extension Shoulder Pendulum with Table Support - 2 x daily - 7 x weekly - 2 sets - 10 reps Shoulder Scaption AAROM with Dowel - 1 x daily - 7 x weekly - 2 sets - 10 reps - 5 hold Doorway Pec Stretch at 60 Degrees Abduction with Arm Straight - 1 x daily - 7 x weekly - 1 sets - 3 reps - 30 hold

## 2020-07-17 ENCOUNTER — Ambulatory Visit (INDEPENDENT_AMBULATORY_CARE_PROVIDER_SITE_OTHER): Payer: BC Managed Care – PPO | Admitting: Physical Therapy

## 2020-07-17 ENCOUNTER — Encounter: Payer: Self-pay | Admitting: Physical Therapy

## 2020-07-17 ENCOUNTER — Encounter: Payer: BC Managed Care – PPO | Admitting: Physical Therapy

## 2020-07-17 ENCOUNTER — Other Ambulatory Visit: Payer: Self-pay

## 2020-07-17 DIAGNOSIS — M6281 Muscle weakness (generalized): Secondary | ICD-10-CM

## 2020-07-17 DIAGNOSIS — R262 Difficulty in walking, not elsewhere classified: Secondary | ICD-10-CM | POA: Diagnosis not present

## 2020-07-17 DIAGNOSIS — M542 Cervicalgia: Secondary | ICD-10-CM

## 2020-07-17 DIAGNOSIS — M25512 Pain in left shoulder: Secondary | ICD-10-CM

## 2020-07-17 DIAGNOSIS — M545 Low back pain, unspecified: Secondary | ICD-10-CM
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
## 2020-07-20 ENCOUNTER — Other Ambulatory Visit: Payer: Self-pay

## 2020-07-20 ENCOUNTER — Ambulatory Visit (INDEPENDENT_AMBULATORY_CARE_PROVIDER_SITE_OTHER): Payer: BC Managed Care – PPO | Admitting: Physical Therapy

## 2020-07-20 ENCOUNTER — Encounter: Payer: BC Managed Care – PPO | Admitting: Physical Therapy

## 2020-07-20 DIAGNOSIS — R262 Difficulty in walking, not elsewhere classified: Secondary | ICD-10-CM

## 2020-07-20 DIAGNOSIS — M25512 Pain in left shoulder: Secondary | ICD-10-CM | POA: Diagnosis not present

## 2020-07-20 DIAGNOSIS — M6281 Muscle weakness (generalized): Secondary | ICD-10-CM

## 2020-07-20 DIAGNOSIS — M545 Low back pain, unspecified: Secondary | ICD-10-CM | POA: Diagnosis not present

## 2020-07-20 DIAGNOSIS — M542 Cervicalgia: Secondary | ICD-10-CM | POA: Diagnosis not present
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
## 2020-07-26 ENCOUNTER — Ambulatory Visit (INDEPENDENT_AMBULATORY_CARE_PROVIDER_SITE_OTHER): Payer: BC Managed Care – PPO | Admitting: Physical Therapy

## 2020-07-26 ENCOUNTER — Encounter: Payer: Self-pay | Admitting: Physical Therapy

## 2020-07-26 DIAGNOSIS — M545 Low back pain, unspecified: Secondary | ICD-10-CM

## 2020-07-26 DIAGNOSIS — M25512 Pain in left shoulder: Secondary | ICD-10-CM | POA: Diagnosis not present

## 2020-07-26 DIAGNOSIS — R262 Difficulty in walking, not elsewhere classified: Secondary | ICD-10-CM

## 2020-07-26 DIAGNOSIS — M6281 Muscle weakness (generalized): Secondary | ICD-10-CM

## 2020-07-26 DIAGNOSIS — M542 Cervicalgia: Secondary | ICD-10-CM | POA: Diagnosis not present
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
## 2020-07-28 ENCOUNTER — Other Ambulatory Visit: Payer: Self-pay

## 2020-07-28 ENCOUNTER — Encounter: Payer: Self-pay | Admitting: Physical Therapy

## 2020-07-28 ENCOUNTER — Ambulatory Visit (INDEPENDENT_AMBULATORY_CARE_PROVIDER_SITE_OTHER): Payer: BC Managed Care – PPO | Admitting: Physical Therapy

## 2020-07-28 DIAGNOSIS — R262 Difficulty in walking, not elsewhere classified: Secondary | ICD-10-CM

## 2020-07-28 DIAGNOSIS — M25512 Pain in left shoulder: Secondary | ICD-10-CM | POA: Diagnosis not present

## 2020-07-28 DIAGNOSIS — M545 Low back pain, unspecified: Secondary | ICD-10-CM | POA: Diagnosis not present

## 2020-07-28 DIAGNOSIS — M542 Cervicalgia: Secondary | ICD-10-CM

## 2020-07-28 DIAGNOSIS — M6281 Muscle weakness (generalized): Secondary | ICD-10-CM
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
## 2020-07-31 ENCOUNTER — Encounter: Payer: BC Managed Care – PPO | Admitting: Physical Therapy

## 2020-08-02 ENCOUNTER — Encounter: Payer: Self-pay | Admitting: Physical Therapy

## 2020-08-02 ENCOUNTER — Other Ambulatory Visit: Payer: Self-pay

## 2020-08-02 ENCOUNTER — Ambulatory Visit (INDEPENDENT_AMBULATORY_CARE_PROVIDER_SITE_OTHER): Payer: BC Managed Care – PPO | Admitting: Physical Therapy

## 2020-08-02 DIAGNOSIS — R262 Difficulty in walking, not elsewhere classified: Secondary | ICD-10-CM | POA: Diagnosis not present

## 2020-08-02 DIAGNOSIS — M25512 Pain in left shoulder: Secondary | ICD-10-CM

## 2020-08-02 DIAGNOSIS — M545 Low back pain, unspecified: Secondary | ICD-10-CM | POA: Diagnosis not present

## 2020-08-02 DIAGNOSIS — M542 Cervicalgia: Secondary | ICD-10-CM

## 2020-08-02 DIAGNOSIS — M6281 Muscle weakness (generalized): Secondary | ICD-10-CM
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
## 2020-08-02 NOTE — Patient Instructions (Addendum)
Access Code: [email or web address] URL: [email or web address] Date: 08/02/2020 Prepared by: Zebedee Iba  Exercises Supine Lower Trunk Rotation - 2 x daily - 7 x weekly - 2 sets - 10 reps - 2 hold Seated Shoulder Shrugs - 2 x daily - 7 x weekly - 2 sets - 10 reps Seated Cervical Rotation AROM - 2 x daily - 7 x weekly - 2 sets - 10 reps Supine Posterior Pelvic Tilt - 1 x daily - 7 x weekly - 2 sets - 10 reps Standing Isometric Shoulder External Rotation with Doorway - 2 x daily - 7 x weekly - 1 sets - 10 reps - 5 hold Standing Isometric Shoulder Internal Rotation at Doorway - 2 x daily - 7 x weekly - 1 sets - 10 reps - 5 hold Flexion-Extension Shoulder Pendulum with Table Support - 2 x daily - 7 x weekly - 2 sets - 10 reps Shoulder Scaption AAROM with Dowel - 1 x daily - 7 x weekly - 2 sets - 10 reps - 5 hold Doorway Pec Stretch at 60 Degrees Abduction with Arm Straight - 1 x daily - 7 x weekly - 1 sets - 3 reps - 30 hold Standing Isometric Shoulder Abduction with Doorway - Arm Bent - 1 x daily - 7 x weekly - 2 sets - 10 reps - 3 hold Supine Active Assistive Single Arm Shoulder Protraction - 1 x daily - 7 x weekly - 2 sets - 10 reps

## 2020-08-16 ENCOUNTER — Encounter: Payer: Self-pay | Admitting: Physical Therapy

## 2020-08-16 ENCOUNTER — Other Ambulatory Visit: Payer: Self-pay

## 2020-08-16 ENCOUNTER — Ambulatory Visit (INDEPENDENT_AMBULATORY_CARE_PROVIDER_SITE_OTHER): Payer: BC Managed Care – PPO | Admitting: Physical Therapy

## 2020-08-16 DIAGNOSIS — M25512 Pain in left shoulder: Secondary | ICD-10-CM

## 2020-08-16 DIAGNOSIS — M6281 Muscle weakness (generalized): Secondary | ICD-10-CM

## 2020-08-16 DIAGNOSIS — M542 Cervicalgia: Secondary | ICD-10-CM | POA: Diagnosis not present

## 2020-08-16 DIAGNOSIS — R262 Difficulty in walking, not elsewhere classified: Secondary | ICD-10-CM

## 2020-08-16 DIAGNOSIS — M545 Low back pain, unspecified: Secondary | ICD-10-CM
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
## 2020-08-18 ENCOUNTER — Ambulatory Visit (INDEPENDENT_AMBULATORY_CARE_PROVIDER_SITE_OTHER): Payer: BC Managed Care – PPO | Admitting: Physical Therapy

## 2020-08-18 ENCOUNTER — Encounter: Payer: Self-pay | Admitting: Physical Therapy

## 2020-08-18 ENCOUNTER — Other Ambulatory Visit: Payer: Self-pay

## 2020-08-18 DIAGNOSIS — R262 Difficulty in walking, not elsewhere classified: Secondary | ICD-10-CM

## 2020-08-18 DIAGNOSIS — M25512 Pain in left shoulder: Secondary | ICD-10-CM | POA: Diagnosis not present

## 2020-08-18 DIAGNOSIS — M542 Cervicalgia: Secondary | ICD-10-CM

## 2020-08-18 DIAGNOSIS — M6281 Muscle weakness (generalized): Secondary | ICD-10-CM

## 2020-08-18 DIAGNOSIS — M545 Low back pain, unspecified: Secondary | ICD-10-CM
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
## 2020-08-21 ENCOUNTER — Encounter: Payer: Self-pay | Admitting: Physical Therapy

## 2020-08-21 ENCOUNTER — Other Ambulatory Visit: Payer: Self-pay

## 2020-08-21 ENCOUNTER — Ambulatory Visit (INDEPENDENT_AMBULATORY_CARE_PROVIDER_SITE_OTHER): Payer: BC Managed Care – PPO | Admitting: Physical Therapy

## 2020-08-21 DIAGNOSIS — M545 Low back pain, unspecified: Secondary | ICD-10-CM | POA: Diagnosis not present

## 2020-08-21 DIAGNOSIS — M25512 Pain in left shoulder: Secondary | ICD-10-CM

## 2020-08-21 DIAGNOSIS — M542 Cervicalgia: Secondary | ICD-10-CM

## 2020-08-21 DIAGNOSIS — R262 Difficulty in walking, not elsewhere classified: Secondary | ICD-10-CM | POA: Diagnosis not present

## 2020-08-21 DIAGNOSIS — M6281 Muscle weakness (generalized): Secondary | ICD-10-CM
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
## 2020-08-21 NOTE — Patient Instructions (Signed)
Access Code: [email or web address] URL: [email or web address] Date: 08/21/2020 Prepared by: Zebedee Iba  Exercises Supine Lower Trunk Rotation - 2 x daily - 7 x weekly - 2 sets - 10 reps - 2 hold Supine Posterior Pelvic Tilt - 1 x daily - 7 x weekly - 2 sets - 10 reps Standing Isometric Shoulder External Rotation with Doorway - 2 x daily - 7 x weekly - 1 sets - 10 reps - 5 hold Standing Isometric Shoulder Internal Rotation at Doorway - 2 x daily - 7 x weekly - 1 sets - 10 reps - 5 hold Flexion-Extension Shoulder Pendulum with Table Support - 2 x daily - 7 x weekly - 2 sets - 10 reps Shoulder Scaption AAROM with Dowel - 1 x daily - 7 x weekly - 2 sets - 10 reps - 5 hold Doorway Pec Stretch at 60 Degrees Abduction with Arm Straight - 1 x daily - 7 x weekly - 1 sets - 3 reps - 30 hold Standing Isometric Shoulder Abduction with Doorway - Arm Bent - 1 x daily - 7 x weekly - 2 sets - 10 reps - 3 hold Supine Active Assistive Single Arm Shoulder Protraction - 1 x daily - 7 x weekly - 2 sets - 10 reps Quadruped Rocking Backward - 1 x daily - 7 x weekly - 2 sets - 10 reps Wall Push Up - 1 x daily - 3-4 x weekly - 2 sets - 10 reps

## 2020-08-24 ENCOUNTER — Other Ambulatory Visit: Payer: Self-pay

## 2020-08-24 ENCOUNTER — Ambulatory Visit (INDEPENDENT_AMBULATORY_CARE_PROVIDER_SITE_OTHER): Payer: BC Managed Care – PPO | Admitting: Physical Therapy

## 2020-08-24 ENCOUNTER — Encounter: Payer: Self-pay | Admitting: Physical Therapy

## 2020-08-24 ENCOUNTER — Ambulatory Visit (HOSPITAL_BASED_OUTPATIENT_CLINIC_OR_DEPARTMENT_OTHER): Payer: BC Managed Care – PPO | Admitting: Physical Therapy

## 2020-08-24 DIAGNOSIS — M6281 Muscle weakness (generalized): Secondary | ICD-10-CM

## 2020-08-24 DIAGNOSIS — R262 Difficulty in walking, not elsewhere classified: Secondary | ICD-10-CM

## 2020-08-24 DIAGNOSIS — M25512 Pain in left shoulder: Secondary | ICD-10-CM

## 2020-08-24 DIAGNOSIS — M545 Low back pain, unspecified: Secondary | ICD-10-CM | POA: Diagnosis not present

## 2020-08-24 DIAGNOSIS — M542 Cervicalgia: Secondary | ICD-10-CM
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
## 2020-08-30 ENCOUNTER — Encounter (HOSPITAL_BASED_OUTPATIENT_CLINIC_OR_DEPARTMENT_OTHER): Payer: Self-pay | Admitting: Physical Therapy

## 2020-08-30 ENCOUNTER — Ambulatory Visit (HOSPITAL_BASED_OUTPATIENT_CLINIC_OR_DEPARTMENT_OTHER): Payer: BC Managed Care – PPO | Attending: Registered Nurse | Admitting: Physical Therapy

## 2020-08-30 ENCOUNTER — Other Ambulatory Visit: Payer: Self-pay

## 2020-08-30 DIAGNOSIS — M542 Cervicalgia: Secondary | ICD-10-CM

## 2020-08-30 DIAGNOSIS — M545 Low back pain, unspecified: Secondary | ICD-10-CM

## 2020-08-30 DIAGNOSIS — R262 Difficulty in walking, not elsewhere classified: Secondary | ICD-10-CM

## 2020-08-30 DIAGNOSIS — M25512 Pain in left shoulder: Secondary | ICD-10-CM | POA: Diagnosis present

## 2020-08-30 DIAGNOSIS — M6281 Muscle weakness (generalized): Secondary | ICD-10-CM | POA: Diagnosis present
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
## 2020-09-02 ENCOUNTER — Other Ambulatory Visit: Payer: Self-pay | Admitting: Family Medicine

## 2020-09-04 ENCOUNTER — Ambulatory Visit (HOSPITAL_BASED_OUTPATIENT_CLINIC_OR_DEPARTMENT_OTHER): Payer: BC Managed Care – PPO | Admitting: Physical Therapy

## 2020-09-04 ENCOUNTER — Other Ambulatory Visit: Payer: Self-pay

## 2020-09-04 ENCOUNTER — Encounter (HOSPITAL_BASED_OUTPATIENT_CLINIC_OR_DEPARTMENT_OTHER): Payer: Self-pay | Admitting: Physical Therapy

## 2020-09-04 DIAGNOSIS — M542 Cervicalgia: Secondary | ICD-10-CM

## 2020-09-04 DIAGNOSIS — M25512 Pain in left shoulder: Secondary | ICD-10-CM

## 2020-09-04 DIAGNOSIS — M6281 Muscle weakness (generalized): Secondary | ICD-10-CM

## 2020-09-04 DIAGNOSIS — R262 Difficulty in walking, not elsewhere classified: Secondary | ICD-10-CM

## 2020-09-04 DIAGNOSIS — M545 Low back pain, unspecified: Secondary | ICD-10-CM
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
## 2020-09-13 ENCOUNTER — Ambulatory Visit (HOSPITAL_BASED_OUTPATIENT_CLINIC_OR_DEPARTMENT_OTHER): Payer: BC Managed Care – PPO | Admitting: Physical Therapy

## 2020-09-13 ENCOUNTER — Encounter (HOSPITAL_BASED_OUTPATIENT_CLINIC_OR_DEPARTMENT_OTHER): Payer: Self-pay | Admitting: Physical Therapy

## 2020-09-13 ENCOUNTER — Other Ambulatory Visit: Payer: Self-pay

## 2020-09-13 DIAGNOSIS — M6281 Muscle weakness (generalized): Secondary | ICD-10-CM

## 2020-09-13 DIAGNOSIS — M25512 Pain in left shoulder: Secondary | ICD-10-CM

## 2020-09-13 DIAGNOSIS — R262 Difficulty in walking, not elsewhere classified: Secondary | ICD-10-CM

## 2020-09-13 DIAGNOSIS — M545 Low back pain, unspecified: Secondary | ICD-10-CM

## 2020-09-13 DIAGNOSIS — M542 Cervicalgia: Secondary | ICD-10-CM | POA: Diagnosis not present

## 2020-09-13 NOTE — Patient Instructions (Signed)
Access Code: [email or web address] URL: [email or web address] Date: 09/13/2020 Prepared by: Zebedee Iba  Exercises Supine Lower Trunk Rotation - 2 x daily - 7 x weekly - 2 sets - 10 reps - 2 hold Supine Posterior Pelvic Tilt - 1 x daily - 7 x weekly - 2 sets - 10 reps Shoulder Scaption AAROM with Dowel - 1 x daily - 7 x weekly - 2 sets - 10 reps - 5 hold Doorway Pec Stretch at 60 Degrees Abduction with Arm Straight - 1 x daily - 7 x weekly - 1 sets - 3 reps - 30 hold Supine Active Assistive Single Arm Shoulder Protraction - 1 x daily - 7 x weekly - 2 sets - 10 reps Quadruped Rocking Backward - 1 x daily - 7 x weekly - 2 sets - 10 reps Sidelying Open Book - 1 x daily - 7 x weekly - 2 sets - 10 reps - 3 hold Wall Push Up - 1 x daily - 3-4 x weekly - 2 sets - 10 reps
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
## 2020-09-19 ENCOUNTER — Ambulatory Visit (HOSPITAL_BASED_OUTPATIENT_CLINIC_OR_DEPARTMENT_OTHER): Payer: BC Managed Care – PPO | Admitting: Physical Therapy

## 2020-09-28 ENCOUNTER — Other Ambulatory Visit: Payer: Self-pay

## 2020-09-28 ENCOUNTER — Encounter (HOSPITAL_BASED_OUTPATIENT_CLINIC_OR_DEPARTMENT_OTHER): Payer: Self-pay | Admitting: Physical Therapy

## 2020-09-28 ENCOUNTER — Ambulatory Visit (HOSPITAL_BASED_OUTPATIENT_CLINIC_OR_DEPARTMENT_OTHER): Payer: BC Managed Care – PPO | Attending: Registered Nurse | Admitting: Physical Therapy

## 2020-09-28 DIAGNOSIS — M545 Low back pain, unspecified: Secondary | ICD-10-CM

## 2020-09-28 DIAGNOSIS — M25512 Pain in left shoulder: Secondary | ICD-10-CM

## 2020-09-28 DIAGNOSIS — R262 Difficulty in walking, not elsewhere classified: Secondary | ICD-10-CM

## 2020-09-28 DIAGNOSIS — M6281 Muscle weakness (generalized): Secondary | ICD-10-CM | POA: Diagnosis present

## 2020-09-28 DIAGNOSIS — M542 Cervicalgia: Secondary | ICD-10-CM | POA: Diagnosis not present
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
## 2020-10-05 ENCOUNTER — Encounter (HOSPITAL_BASED_OUTPATIENT_CLINIC_OR_DEPARTMENT_OTHER): Payer: Self-pay | Admitting: Physical Therapy

## 2020-10-05 ENCOUNTER — Other Ambulatory Visit: Payer: Self-pay

## 2020-10-05 ENCOUNTER — Ambulatory Visit (HOSPITAL_BASED_OUTPATIENT_CLINIC_OR_DEPARTMENT_OTHER): Payer: BC Managed Care – PPO | Admitting: Physical Therapy

## 2020-10-05 DIAGNOSIS — M542 Cervicalgia: Secondary | ICD-10-CM | POA: Diagnosis not present

## 2020-10-05 DIAGNOSIS — M545 Low back pain, unspecified: Secondary | ICD-10-CM

## 2020-10-05 DIAGNOSIS — M25512 Pain in left shoulder: Secondary | ICD-10-CM

## 2020-10-05 DIAGNOSIS — R262 Difficulty in walking, not elsewhere classified: Secondary | ICD-10-CM

## 2020-10-05 DIAGNOSIS — M6281 Muscle weakness (generalized): Secondary | ICD-10-CM
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
## 2020-10-12 ENCOUNTER — Ambulatory Visit (HOSPITAL_BASED_OUTPATIENT_CLINIC_OR_DEPARTMENT_OTHER): Payer: BC Managed Care – PPO | Admitting: Physical Therapy

## 2020-10-25 ENCOUNTER — Encounter (HOSPITAL_BASED_OUTPATIENT_CLINIC_OR_DEPARTMENT_OTHER): Payer: Self-pay | Admitting: Physical Therapy

## 2020-10-25 ENCOUNTER — Other Ambulatory Visit: Payer: Self-pay

## 2020-10-25 ENCOUNTER — Ambulatory Visit (HOSPITAL_BASED_OUTPATIENT_CLINIC_OR_DEPARTMENT_OTHER): Payer: BC Managed Care – PPO | Admitting: Physical Therapy

## 2020-10-25 DIAGNOSIS — M542 Cervicalgia: Secondary | ICD-10-CM

## 2020-10-25 DIAGNOSIS — M6281 Muscle weakness (generalized): Secondary | ICD-10-CM

## 2020-10-25 DIAGNOSIS — M25512 Pain in left shoulder: Secondary | ICD-10-CM

## 2020-10-25 DIAGNOSIS — R262 Difficulty in walking, not elsewhere classified: Secondary | ICD-10-CM

## 2020-10-25 DIAGNOSIS — M545 Low back pain, unspecified: Secondary | ICD-10-CM
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
## 2020-11-24 ENCOUNTER — Other Ambulatory Visit: Payer: Self-pay | Admitting: Family Medicine

## 2020-12-05 ENCOUNTER — Other Ambulatory Visit: Payer: Self-pay | Admitting: Family Medicine
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
## 2020-12-15 ENCOUNTER — Telehealth: Payer: Self-pay

## 2020-12-15 NOTE — Telephone Encounter (Signed)
Caller name:Clevester Arvilla Market   On DPR? :No  Call back number:[phone]  Provider they see: Janeece Agee   Reason for call: Pt attorney called needing notes from the 04/29 said she has spoke with Huntley Dec on this about getting the paperwork.

## 2021-01-23 ENCOUNTER — Encounter (HOSPITAL_COMMUNITY): Payer: Self-pay

## 2021-01-23 ENCOUNTER — Ambulatory Visit (HOSPITAL_COMMUNITY)
Admission: EM | Admit: 2021-01-23 | Discharge: 2021-01-23 | Disposition: A | Discharge status: Home / Self Care | Payer: BC Managed Care – PPO | Attending: Family Medicine | Admitting: Family Medicine

## 2021-01-23 ENCOUNTER — Other Ambulatory Visit: Payer: Self-pay

## 2021-01-23 DIAGNOSIS — J069 Acute upper respiratory infection, unspecified: Secondary | ICD-10-CM

## 2021-01-23 MED ORDER — OSELTAMIVIR PHOSPHATE 75 MG PO CAPS: 75.0000 mg | ORAL_CAPSULE | Freq: Two times a day (BID) | ORAL | 0 refills | Status: AC

## 2021-01-23 MED ORDER — PROMETHAZINE-DM 6.25-15 MG/5ML PO SYRP: 5.0000 mL | ORAL_SOLUTION | Freq: Four times a day (QID) | ORAL | 0 refills | Status: DC | PRN

## 2021-01-23 NOTE — ED Provider Notes (Signed)
[facility]   [id number] 01/23/21 Arrival Time: 1740  ASSESSMENT & PLAN:  1. Viral URI with cough    High suspicion for influenza. Discussed typical duration of viral illnesses. Work note provided. OTC symptom care as needed.  Meds ordered this encounter  Medications   oseltamivir (TAMIFLU) 75 MG capsule    Sig: Take 1 capsule (75 mg total) by mouth 2 (two) times daily for 5 days.    Dispense:  10 capsule    Refill:  0   promethazine-dextromethorphan (PROMETHAZINE-DM) 6.25-15 MG/5ML syrup    Sig: Take 5 mLs by mouth 4 (four) times daily as needed for cough.    Dispense:  118 mL    Refill:  0     Follow-up Information     Ardith Dark, MD.   Specialty: Family Medicine Why: As needed. Contact information: [address] [place] [address] [phone]                 Reviewed expectations re: course of current medical issues. Questions answered. Outlined signs and symptoms indicating need for more acute intervention. Understanding verbalized. After Visit Summary given.   SUBJECTIVE: History from: patient. Gene Weeks is a 45 y.o. male who reports: fever, chills, body aches, cough; x 24 hours. Denies: difficulty breathing. Normal PO intake without n/v/d.   OBJECTIVE:  Vitals:   01/23/21 1842  BP: 137/88  Pulse: (!) 124  Resp: 18  Temp: 99.9 F (37.7 C)  TempSrc: Oral  SpO2: 100%  Tachycardia noted.  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 December 2013 with acute weakness and numbness involving his right side. Treated with TPA with resolution of symptoms within 20 minutes. Normal neuro evaluation and normal MRI    Social History   Socioeconomic History   Marital status: Single    Spouse name: Not on file   Number of children: Not on file   Years of education: Not on file   Highest education level: Not on file  Occupational History   Not on file  Tobacco Use   Smoking status: Never   Smokeless tobacco: Never  Substance and Sexual Activity   Alcohol use: No   Drug use: No   Sexual activity: Not on file  Other Topics Concern   Not on file  Social History Narrative   Not on file   Social Determinants of Health   Financial Resource Strain: Not on file  Food Insecurity: Not on file  Transportation Needs: Not on file  Physical Activity: Not on file  Stress: Not on file  Social Connections: Not on file  Intimate Partner Violence: Not on file   Family History  Problem Relation Age of Onset   Cancer Mother        colon ca   Hypothyroidism Mother  Hyperlipidemia Father    Diabetes Cousin    Past Surgical History:  Procedure Laterality Date   KNEE ARTHROSCOPY     left   SHOULDER ARTHROSCOPY     Rotator cuff tear     Mardella Layman, MD 01/23/21 [phone]

## 2021-01-23 NOTE — ED Triage Notes (Signed)
Pt presented to the office for cough,congestion and fever. 

## 2021-03-07 ENCOUNTER — Other Ambulatory Visit: Payer: Self-pay | Admitting: Family Medicine

## 2021-03-26 ENCOUNTER — Other Ambulatory Visit: Payer: Self-pay

## 2021-03-26 ENCOUNTER — Emergency Department (HOSPITAL_COMMUNITY): Payer: BC Managed Care – PPO

## 2021-03-26 ENCOUNTER — Encounter (HOSPITAL_COMMUNITY): Payer: Self-pay | Admitting: Emergency Medicine

## 2021-03-26 ENCOUNTER — Inpatient Hospital Stay (HOSPITAL_COMMUNITY): Payer: BC Managed Care – PPO

## 2021-03-26 ENCOUNTER — Inpatient Hospital Stay (HOSPITAL_COMMUNITY)
Admission: EM | Admit: 2021-03-26 | Discharge: 2021-04-07 | DRG: 233 | Disposition: A | Discharge status: Home / Self Care | Payer: BC Managed Care – PPO | Attending: Thoracic Surgery (Cardiothoracic Vascular Surgery) | Admitting: Thoracic Surgery (Cardiothoracic Vascular Surgery)

## 2021-03-26 DIAGNOSIS — I214 Non-ST elevation (NSTEMI) myocardial infarction: Principal | ICD-10-CM | POA: Diagnosis present

## 2021-03-26 DIAGNOSIS — E1151 Type 2 diabetes mellitus with diabetic peripheral angiopathy without gangrene: Secondary | ICD-10-CM | POA: Diagnosis present

## 2021-03-26 DIAGNOSIS — I480 Paroxysmal atrial fibrillation: Secondary | ICD-10-CM | POA: Diagnosis not present

## 2021-03-26 DIAGNOSIS — K5900 Constipation, unspecified: Secondary | ICD-10-CM | POA: Diagnosis not present

## 2021-03-26 DIAGNOSIS — E1165 Type 2 diabetes mellitus with hyperglycemia: Secondary | ICD-10-CM | POA: Diagnosis present

## 2021-03-26 DIAGNOSIS — D696 Thrombocytopenia, unspecified: Secondary | ICD-10-CM | POA: Diagnosis not present

## 2021-03-26 DIAGNOSIS — Z8673 Personal history of transient ischemic attack (TIA), and cerebral infarction without residual deficits: Secondary | ICD-10-CM | POA: Diagnosis not present

## 2021-03-26 DIAGNOSIS — R066 Hiccough: Secondary | ICD-10-CM | POA: Diagnosis not present

## 2021-03-26 DIAGNOSIS — Z794 Long term (current) use of insulin: Secondary | ICD-10-CM | POA: Diagnosis not present

## 2021-03-26 DIAGNOSIS — J939 Pneumothorax, unspecified: Secondary | ICD-10-CM

## 2021-03-26 DIAGNOSIS — D62 Acute posthemorrhagic anemia: Secondary | ICD-10-CM | POA: Diagnosis not present

## 2021-03-26 DIAGNOSIS — R55 Syncope and collapse: Secondary | ICD-10-CM

## 2021-03-26 DIAGNOSIS — Z951 Presence of aortocoronary bypass graft: Secondary | ICD-10-CM

## 2021-03-26 DIAGNOSIS — I471 Supraventricular tachycardia: Secondary | ICD-10-CM | POA: Diagnosis not present

## 2021-03-26 DIAGNOSIS — Z8349 Family history of other endocrine, nutritional and metabolic diseases: Secondary | ICD-10-CM

## 2021-03-26 DIAGNOSIS — I5021 Acute systolic (congestive) heart failure: Secondary | ICD-10-CM | POA: Diagnosis not present

## 2021-03-26 DIAGNOSIS — I25110 Atherosclerotic heart disease of native coronary artery with unstable angina pectoris: Secondary | ICD-10-CM | POA: Diagnosis present

## 2021-03-26 DIAGNOSIS — Z800 Family history of malignant neoplasm of digestive organs: Secondary | ICD-10-CM

## 2021-03-26 DIAGNOSIS — Z79899 Other long term (current) drug therapy: Secondary | ICD-10-CM

## 2021-03-26 DIAGNOSIS — I5020 Unspecified systolic (congestive) heart failure: Secondary | ICD-10-CM | POA: Diagnosis not present

## 2021-03-26 DIAGNOSIS — Z01810 Encounter for preprocedural cardiovascular examination: Secondary | ICD-10-CM | POA: Diagnosis not present

## 2021-03-26 DIAGNOSIS — E785 Hyperlipidemia, unspecified: Secondary | ICD-10-CM | POA: Diagnosis present

## 2021-03-26 DIAGNOSIS — I255 Ischemic cardiomyopathy: Secondary | ICD-10-CM | POA: Diagnosis present

## 2021-03-26 DIAGNOSIS — Z880 Allergy status to penicillin: Secondary | ICD-10-CM

## 2021-03-26 DIAGNOSIS — I10 Essential (primary) hypertension: Secondary | ICD-10-CM | POA: Diagnosis not present

## 2021-03-26 DIAGNOSIS — I4891 Unspecified atrial fibrillation: Secondary | ICD-10-CM | POA: Diagnosis not present

## 2021-03-26 DIAGNOSIS — Z7984 Long term (current) use of oral hypoglycemic drugs: Secondary | ICD-10-CM

## 2021-03-26 DIAGNOSIS — Z20822 Contact with and (suspected) exposure to covid-19: Secondary | ICD-10-CM | POA: Diagnosis present

## 2021-03-26 DIAGNOSIS — E876 Hypokalemia: Secondary | ICD-10-CM | POA: Diagnosis not present

## 2021-03-26 DIAGNOSIS — I472 Ventricular tachycardia, unspecified: Secondary | ICD-10-CM

## 2021-03-26 DIAGNOSIS — T81509A Unspecified complication of foreign body accidentally left in body following unspecified procedure, initial encounter: Secondary | ICD-10-CM

## 2021-03-26 DIAGNOSIS — I4901 Ventricular fibrillation: Secondary | ICD-10-CM | POA: Diagnosis not present

## 2021-03-26 DIAGNOSIS — I25118 Atherosclerotic heart disease of native coronary artery with other forms of angina pectoris: Secondary | ICD-10-CM | POA: Diagnosis not present

## 2021-03-26 DIAGNOSIS — R7989 Other specified abnormal findings of blood chemistry: Secondary | ICD-10-CM | POA: Diagnosis present

## 2021-03-26 DIAGNOSIS — E119 Type 2 diabetes mellitus without complications: Secondary | ICD-10-CM | POA: Diagnosis not present

## 2021-03-26 DIAGNOSIS — J9811 Atelectasis: Secondary | ICD-10-CM | POA: Diagnosis not present

## 2021-03-26 DIAGNOSIS — I469 Cardiac arrest, cause unspecified: Secondary | ICD-10-CM

## 2021-03-26 DIAGNOSIS — I420 Dilated cardiomyopathy: Secondary | ICD-10-CM

## 2021-03-26 DIAGNOSIS — R569 Unspecified convulsions: Secondary | ICD-10-CM

## 2021-03-26 DIAGNOSIS — I428 Other cardiomyopathies: Secondary | ICD-10-CM | POA: Diagnosis not present

## 2021-03-26 DIAGNOSIS — I462 Cardiac arrest due to underlying cardiac condition: Secondary | ICD-10-CM | POA: Diagnosis not present

## 2021-03-26 DIAGNOSIS — J90 Pleural effusion, not elsewhere classified: Secondary | ICD-10-CM

## 2021-03-26 DIAGNOSIS — R778 Other specified abnormalities of plasma proteins: Secondary | ICD-10-CM

## 2021-03-26 DIAGNOSIS — Z833 Family history of diabetes mellitus: Secondary | ICD-10-CM

## 2021-03-26 DIAGNOSIS — I2510 Atherosclerotic heart disease of native coronary artery without angina pectoris: Secondary | ICD-10-CM | POA: Diagnosis not present

## 2021-03-26 DIAGNOSIS — I110 Hypertensive heart disease with heart failure: Secondary | ICD-10-CM | POA: Diagnosis present

## 2021-03-26 DIAGNOSIS — M1990 Unspecified osteoarthritis, unspecified site: Secondary | ICD-10-CM | POA: Diagnosis present

## 2021-03-26 LAB — ECHOCARDIOGRAM COMPLETE
Area-P 1/2: 4.49 cm2
Calc EF: 21.9 %
Height: 77 in
S' Lateral: 5.7 cm
Single Plane A2C EF: 21.4 %
Single Plane A4C EF: 22.4 %
Weight: 4240 oz

## 2021-03-26 LAB — COMPREHENSIVE METABOLIC PANEL
ALT: 59 U/L — ABNORMAL HIGH (ref 0–44)
AST: 68 U/L — ABNORMAL HIGH (ref 15–41)
Albumin: 3.9 g/dL (ref 3.5–5.0)
Alkaline Phosphatase: 66 U/L (ref 38–126)
Anion gap: 11 (ref 5–15)
BUN: 12 mg/dL (ref 6–20)
CO2: 24 mmol/L (ref 22–32)
Calcium: 9.3 mg/dL (ref 8.9–10.3)
Chloride: 103 mmol/L (ref 98–111)
Creatinine, Ser: 1.05 mg/dL (ref 0.61–1.24)
GFR, Estimated: >60 mL/min (ref >60)
Glucose, Bld: 240 mg/dL — ABNORMAL HIGH (ref 70–99)
Potassium: 3.5 mmol/L (ref 3.5–5.1)
Sodium: 138 mmol/L (ref 135–145)
Total Bilirubin: 0.7 mg/dL (ref 0.3–1.2)
Total Protein: 7.1 g/dL (ref 6.5–8.1)

## 2021-03-26 LAB — HEMOGLOBIN A1C
Hgb A1c MFr Bld: 8.2 % — ABNORMAL HIGH (ref 4.8–5.6)
Mean Plasma Glucose: 188.64 mg/dL

## 2021-03-26 LAB — CBC
HCT: 45.3 % (ref 39.0–52.0)
Hemoglobin: 15.4 g/dL (ref 13.0–17.0)
MCH: 32.2 pg (ref 26.0–34.0)
MCHC: 34 g/dL (ref 30.0–36.0)
MCV: 94.6 fL (ref 80.0–100.0)
Platelets: 196 10*3/uL (ref 150–400)
RBC: 4.79 MIL/uL (ref 4.22–5.81)
RDW: 12.8 % (ref 11.5–15.5)
WBC: 6.1 10*3/uL (ref 4.0–10.5)
nRBC: 0 % (ref 0.0–0.2)

## 2021-03-26 LAB — D-DIMER, QUANTITATIVE: D-Dimer, Quant: 0.35 ug/mL-FEU (ref 0.00–0.50)

## 2021-03-26 LAB — TROPONIN I (HIGH SENSITIVITY)
Troponin I (High Sensitivity): 196 ng/L (ref <18)
Troponin I (High Sensitivity): 263 ng/L (ref <18)

## 2021-03-26 LAB — RESP PANEL BY RT-PCR (FLU A&B, COVID) ARPGX2
Influenza A by PCR: NEGATIVE
Influenza B by PCR: NEGATIVE
SARS Coronavirus 2 by RT PCR: NEGATIVE

## 2021-03-26 LAB — HEPARIN LEVEL (UNFRACTIONATED): Heparin Unfractionated: 0.14 IU/mL — ABNORMAL LOW (ref 0.30–0.70)

## 2021-03-26 LAB — HIV ANTIBODY (ROUTINE TESTING W REFLEX): HIV Screen 4th Generation wRfx: NONREACTIVE

## 2021-03-26 LAB — CBG MONITORING, ED: Glucose-Capillary: 145 mg/dL — ABNORMAL HIGH (ref 70–99)

## 2021-03-26 MED ORDER — METOPROLOL TARTRATE 12.5 MG HALF TABLET
12.5000 mg | ORAL_TABLET | Freq: Two times a day (BID) | ORAL | Status: DC
  Administered 2021-03-26 (×2): 12.5 mg via ORAL
  Filled 2021-03-26 (×2): qty 1

## 2021-03-26 MED ORDER — DAPAGLIFLOZIN PROPANEDIOL 10 MG PO TABS
10.0000 mg | ORAL_TABLET | Freq: Every day | ORAL | Status: DC
  Administered 2021-03-28: 10 mg via ORAL
  Filled 2021-03-26 (×3): qty 1

## 2021-03-26 MED ORDER — SODIUM CHLORIDE 0.9 % IV SOLN: 250.0000 mL | INTRAVENOUS | Status: DC | PRN

## 2021-03-26 MED ORDER — SODIUM CHLORIDE 0.9% FLUSH: 3.0000 mL | INTRAVENOUS | Status: DC | PRN

## 2021-03-26 MED ORDER — ASPIRIN 81 MG PO CHEW: 324.0000 mg | CHEWABLE_TABLET | ORAL | Status: AC

## 2021-03-26 MED ORDER — ASPIRIN 300 MG RE SUPP: 300.0000 mg | RECTAL | Status: AC

## 2021-03-26 MED ORDER — ASPIRIN EC 81 MG PO TBEC
81.0000 mg | DELAYED_RELEASE_TABLET | Freq: Every day | ORAL | Status: DC
  Administered 2021-03-27 – 2021-03-29 (×3): 81 mg via ORAL
  Filled 2021-03-26 (×3): qty 1

## 2021-03-26 MED ORDER — ENOXAPARIN SODIUM 40 MG/0.4ML IJ SOSY: 40.0000 mg | PREFILLED_SYRINGE | INTRAMUSCULAR | Status: DC

## 2021-03-26 MED ORDER — ATORVASTATIN CALCIUM 80 MG PO TABS
80.0000 mg | ORAL_TABLET | Freq: Every day | ORAL | Status: DC
  Administered 2021-03-26 – 2021-04-07 (×12): 80 mg via ORAL
  Filled 2021-03-26 (×2): qty 1
  Filled 2021-03-26: qty 2
  Filled 2021-03-26 (×9): qty 1

## 2021-03-26 MED ORDER — ACETAMINOPHEN 500 MG PO TABS: 1000.0000 mg | ORAL_TABLET | Freq: Four times a day (QID) | ORAL | Status: DC | PRN

## 2021-03-26 MED ORDER — HEPARIN (PORCINE) 25000 UT/250ML-% IV SOLN
1650.0000 [IU]/h | INTRAVENOUS | Status: DC
  Administered 2021-03-26: 1400 [IU]/h via INTRAVENOUS
  Filled 2021-03-26 (×2): qty 250

## 2021-03-26 MED ORDER — SODIUM CHLORIDE 0.9 % WEIGHT BASED INFUSION: 3.0000 mL/kg/h | INTRAVENOUS | Status: DC

## 2021-03-26 MED ORDER — INSULIN GLARGINE-LIXISENATIDE 100-33 UNT-MCG/ML ~~LOC~~ SOPN: 21.0000 [IU] | PEN_INJECTOR | Freq: Every day | SUBCUTANEOUS | Status: DC

## 2021-03-26 MED ORDER — INSULIN ASPART 100 UNIT/ML IJ SOLN
0.0000 [IU] | Freq: Three times a day (TID) | INTRAMUSCULAR | Status: DC
  Administered 2021-03-26: 2 [IU] via SUBCUTANEOUS
  Administered 2021-03-27: 11 [IU] via SUBCUTANEOUS
  Administered 2021-03-28: 5 [IU] via SUBCUTANEOUS
  Administered 2021-03-28 – 2021-03-29 (×3): 3 [IU] via SUBCUTANEOUS

## 2021-03-26 MED ORDER — SODIUM CHLORIDE 0.9 % WEIGHT BASED INFUSION: 1.0000 mL/kg/h | INTRAVENOUS | Status: DC

## 2021-03-26 MED ORDER — LISINOPRIL 20 MG PO TABS: 20.0000 mg | ORAL_TABLET | Freq: Every day | ORAL | Status: DC

## 2021-03-26 MED ORDER — ASPIRIN 81 MG PO CHEW
324.0000 mg | CHEWABLE_TABLET | Freq: Once | ORAL | Status: AC
Start: 2021-03-26 — End: 2021-03-26
  Administered 2021-03-26: 324 mg via ORAL
  Filled 2021-03-26: qty 4

## 2021-03-26 MED ORDER — NITROGLYCERIN 0.4 MG SL SUBL: 0.4000 mg | SUBLINGUAL_TABLET | SUBLINGUAL | Status: DC | PRN

## 2021-03-26 MED ORDER — ONDANSETRON HCL 4 MG/2ML IJ SOLN: 4.0000 mg | Freq: Four times a day (QID) | INTRAMUSCULAR | Status: DC | PRN

## 2021-03-26 MED ORDER — HEPARIN BOLUS VIA INFUSION
4000.0000 [IU] | Freq: Once | INTRAVENOUS | Status: AC
  Administered 2021-03-26: 4000 [IU] via INTRAVENOUS
  Filled 2021-03-26: qty 4000

## 2021-03-26 MED ORDER — ACETAMINOPHEN 325 MG PO TABS
650.0000 mg | ORAL_TABLET | ORAL | Status: DC | PRN
  Administered 2021-03-27: 650 mg via ORAL
  Filled 2021-03-26 (×2): qty 2

## 2021-03-26 MED ORDER — SODIUM CHLORIDE 0.9% FLUSH
3.0000 mL | Freq: Two times a day (BID) | INTRAVENOUS | Status: DC
  Administered 2021-03-26 – 2021-03-28 (×2): 3 mL via INTRAVENOUS

## 2021-03-26 MED ORDER — PERFLUTREN LIPID MICROSPHERE
1.0000 mL | INTRAVENOUS | Status: AC | PRN
  Administered 2021-03-26: 2 mL via INTRAVENOUS
  Filled 2021-03-26: qty 10

## 2021-03-26 NOTE — Progress Notes (Signed)
ANTICOAGULATION CONSULT NOTE - Initial Consult  Pharmacy Consult for heparin Indication: chest pain/ACS  Allergies  Allergen Reactions   Amoxicillin Hives    Patient Measurements: Height: 6\' 5"  (195.6 cm) Weight: 120.2 kg (265 lb) IBW/kg (Calculated) : 89.1 Heparin Dosing Weight: 113kg  Vital Signs: BP: 107/71 (01/30 2100) Pulse Rate: 97 (01/30 2100)  Labs: Recent Labs    03/26/21 0926 03/26/21 1144 03/26/21 2113  HGB 15.4  --   --   HCT 45.3  --   --   PLT 196  --   --   HEPARINUNFRC  --   --  0.14*  CREATININE 1.05  --   --   TROPONINIHS 196* 263*  --      Estimated Creatinine Clearance: 127.5 mL/min (by C-G formula based on SCr of 1.05 mg/dL).   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 December 2013 with acute weakness and numbness involving his right side. Treated with TPA with resolution of symptoms within 20 minutes. Normal neuro evaluation and normal MRI     Assessment: 45 YOM presenting with CP and near syncope, he is not on anticoagulation PTA, CBC wnl  Heparin level 0.14 (heparin 1400 units/hr)  Goal of Therapy:  Heparin level 0.3-0.7 units/ml Monitor platelets by anticoagulation protocol: Yes   Plan:  Will increase to heparin 1650 units/hr Heparin level with am labs Daily heparin level and CBC ordered  Thank you for allowing pharmacy to be a part of this patients care.  January 2014, PharmD Clinical Pharmacist  Please check AMION for all [facility] Pharmacy numbers After 10:00 PM, call Main Pharmacy [phone]
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
## 2021-03-26 NOTE — ED Provider Notes (Signed)
[facility] EMERGENCY DEPARTMENT Provider Note   CSN: [id number] Arrival date & time: 03/26/21  3086     History  Chief Complaint  Patient presents with   Chest Pain    Gene Weeks is a 46 y.o. male.  HPI     Pt presents to the ED with reports of CP and SOB. States this morning he had an episode of lightheadedness while driving followed by syncope after he pulled over.  He noticed that he had wet himself and he woke up and was slightly disoriented.  No history of seizure. he had associated chest pain. He has had similar episodes in the past, including one yesterday. He states that he feels lightheaded and then noticed his vision narrowing, and at these times also experiences CP that was midsternal. The pain is described as "aching" and lasts 10-15 minutes before resolving with rest.  Reports radiation towards his neck. He also states the pain at times feels like heartburn. The SOB he experiences is described as "fullness" in his chest. This occurs both at rest and with exertion. The patient reports occasional episodes of palpitations as well. He denies LE edema, pain, or redness. Denies smoking hx. Denies fever, N/V/D, URI sxs at this time. PMH significant for hypertension, diabetes, and DVT.   Home Medications Prior to Admission medications   Medication Sig Start Date End Date Taking? Authorizing Provider  acetaminophen (TYLENOL) 500 MG tablet Take 1,000 mg by mouth every 6 (six) hours as needed for mild pain, fever or headache.   Yes [provider]  FARXIGA 10 MG TABS tablet TAKE 1 TABLET(10 MG) BY MOUTH DAILY Patient taking differently: Take 10 mg by mouth daily. 12/05/20  Yes Ardith Dark, MD  lisinopril (ZESTRIL) 20 MG tablet TAKE 1 TABLET(20 MG) BY MOUTH DAILY Patient taking differently: Take 20 mg by mouth daily. 05/21/20  Yes Ardith Dark, MD  metFORMIN (GLUCOPHAGE-XR) 500 MG 24 hr tablet TAKE 2 TABLETS(1000 MG) BY MOUTH TWICE DAILY Patient  taking differently: Take 1,000 mg by mouth 2 (two) times daily. 03/21/20  Yes Parker, [name], MD  NOVOLOG FLEXPEN 100 UNIT/ML FlexPen INJECT 8 UNITS IF BLOOD SUGAR 71-140, 12 UNITS BLOOD SUGAR 141-200, 16 UNITS BLOOD SUGAR GREATER THAN 200 Patient taking differently: Inject 8-16 Units into the skin 3 (three) times daily with meals. Inject 8 units if Blood sugar 71-140 12 units blood sugar 141-200 16 units blood sugar greater than 200 03/07/21  Yes Ardith Dark, MD  sildenafil (VIAGRA) 100 MG tablet Take 0.5-1 tablets (50-100 mg total) by mouth daily as needed for erectile dysfunction. 03/21/20  Yes Ardith Dark, MD  SOLIQUA 100-33 UNT-MCG/ML SOPN ADMINISTER 21 UNITS UNDER THE SKIN AT BEDTIME Patient taking differently: Inject 21 Units into the skin at bedtime. 11/24/20  Yes Ardith Dark, MD  glucose blood [facility] VERIO) test strip USE TO CHECK BLOOD SUGAR THREE TIMES A DAY BEFORE MEALS AND PRN 12/17/17   Roderick Pee, MD  Insulin Pen Needle 29G X 12.7MM MISC 1 Device by Does not apply route daily. 03/24/20   Ardith Dark, MD  ONE TOUCH LANCETS MISC USE TO CHECK BLOOD SUGAR THREE TIMES A BEFORE MEALS AND PRN 12/17/17   Roderick Pee, MD  promethazine-dextromethorphan (PROMETHAZINE-DM) 6.25-15 MG/5ML syrup Take 5 mLs by mouth 4 (four) times daily as needed for cough. Patient not taking: Reported on 03/26/2021 01/23/21   Mardella Layman, MD      Allergies  Amoxicillin    Review of Systems   Review of Systems  Constitutional:  Positive for activity change.  Respiratory:  Positive for shortness of breath.   Cardiovascular:  Positive for chest pain.  Neurological:  Positive for seizures and syncope.   Physical Exam Updated Vital Signs BP 106/82    Pulse 88    Temp 97.9 F (36.6 C) (Oral)    Resp (!) 23    Ht 6\' 5"  (1.956 m)    Wt 120.2 kg    SpO2 100%    BMI 31.42 kg/m  Physical Exam Vitals and nursing note reviewed.  Constitutional:      Appearance: He is well-developed.   HENT:     Head: Atraumatic.  Cardiovascular:     Rate and Rhythm: Normal rate.  Pulmonary:     Effort: Pulmonary effort is normal.     Breath sounds: Normal breath sounds.  Musculoskeletal:     Cervical back: Neck supple.     Right lower leg: No edema.     Left lower leg: No edema.  Skin:    General: Skin is warm.  Neurological:     Mental Status: He is alert and oriented to person, place, and time.    ED Results / Procedures / Treatments   Labs (all labs ordered are listed, but only abnormal results are displayed) Labs Reviewed  COMPREHENSIVE METABOLIC PANEL - Abnormal; Notable for the following components:      Result Value   Glucose, Bld 240 (*)    AST 68 (*)    ALT 59 (*)    All other components within normal limits  HEMOGLOBIN A1C - Abnormal; Notable for the following components:   Hgb A1c MFr Bld 8.2 (*)    All other components within normal limits  TROPONIN I (HIGH SENSITIVITY) - Abnormal; Notable for the following components:   Troponin I (High Sensitivity) 196 (*)    All other components within normal limits  TROPONIN I (HIGH SENSITIVITY) - Abnormal; Notable for the following components:   Troponin I (High Sensitivity) 263 (*)    All other components within normal limits  RESP PANEL BY RT-PCR (FLU A&B, COVID) ARPGX2  CBC  D-DIMER, QUANTITATIVE  RAPID URINE DRUG SCREEN, HOSP PERFORMED  HEPARIN LEVEL (UNFRACTIONATED)  HIV ANTIBODY (ROUTINE TESTING W REFLEX)    EKG EKG Interpretation  Date/Time:  Monday March 26 2021 09:10:34 EST Ventricular Rate:  104 PR Interval:  138 QRS Duration: 112 QT Interval:  374 QTC Calculation: 491 R Axis:   47 Text Interpretation: Sinus tachycardia with occasional Premature ventricular complexes Inferior infarct , age undetermined Cannot rule out Anterior infarct , age undetermined Abnormal ECG No previous ECGs available [id number] lateral TWI No old tracing to compare Confirmed by Varney Biles [phone]) on 03/26/2021 10:21:07  AM  Radiology DG Chest 2 View  Result Date: 03/26/2021 CLINICAL DATA:  Chest pain EXAM: CHEST - 2 VIEW COMPARISON:  07/14/2018 FINDINGS: The heart size and mediastinal contours are within normal limits. Both lungs are clear. The visualized skeletal structures are unremarkable. IMPRESSION: No active cardiopulmonary disease. Electronically Signed   By: Misty Stanley M.D.   On: 03/26/2021 09:40    Procedures .Critical Care Performed by: Varney Biles, MD Authorized by: Varney Biles, MD   Critical care provider statement:    Critical care time (minutes):  36   Critical care was necessary to treat or prevent imminent or life-threatening deterioration of the following conditions:  Cardiac failure   Critical  care was time spent personally by me on the following activities:  Development of treatment plan with patient or surrogate, discussions with consultants, evaluation of patient's response to treatment, examination of patient, ordering and review of laboratory studies, ordering and review of radiographic studies, ordering and performing treatments and interventions, pulse oximetry, re-evaluation of patient's condition and review of old [place] discussed with: admitting provider      Medications Ordered in ED Medications  insulin aspart (novoLOG) injection 0-15 Units (has no administration in time range)  metoprolol tartrate (LOPRESSOR) tablet 12.5 mg (12.5 mg Oral Given 03/26/21 1326)  atorvastatin (LIPITOR) tablet 80 mg (80 mg Oral Given 03/26/21 1326)  lisinopril (ZESTRIL) tablet 20 mg (has no administration in time range)  dapagliflozin propanediol (FARXIGA) tablet 10 mg (has no administration in time range)  Insulin Glargine-Lixisenatide 100-33 UNT-MCG/ML SOPN 21 Units (has no administration in time range)  aspirin chewable tablet 324 mg (324 mg Oral Not Given 03/26/21 1320)    Or  aspirin suppository 300 mg ( Rectal See Alternative 03/26/21 1320)  aspirin EC tablet 81 mg (has no  administration in time range)  nitroGLYCERIN (NITROSTAT) SL tablet 0.4 mg (has no administration in time range)  acetaminophen (TYLENOL) tablet 650 mg (has no administration in time range)  ondansetron (ZOFRAN) injection 4 mg (has no administration in time range)  heparin ADULT infusion 100 units/mL (25000 units/276mL) (1,400 Units/hr Intravenous New Bag/Given 03/26/21 1256)  sodium chloride flush (NS) 0.9 % injection 3 mL (3 mLs Intravenous Not Given 03/26/21 1321)  sodium chloride flush (NS) 0.9 % injection 3 mL (has no administration in time range)  0.9 %  sodium chloride infusion (has no administration in time range)  0.9% sodium chloride infusion (has no administration in time range)    Followed by  0.9% sodium chloride infusion (has no administration in time range)  aspirin chewable tablet 324 mg (324 mg Oral Given 03/26/21 1131)  heparin bolus via infusion 4,000 Units (4,000 Units Intravenous Bolus from Bag 03/26/21 1257)    ED Course/ Medical Decision Making/ A&P Clinical Course as of 03/26/21 1446  Mon Mar 26, 2021  0952 ED EKG [CH]  0953 DG Chest 2 View [CH]    Clinical Course User Index [CH] Clearence Ped, Student-PA                           Medical Decision Making Amount and/or Complexity of Data Reviewed Labs: ordered. Radiology: ordered.  Risk OTC drugs. Decision regarding hospitalization.   This patient presents to the ED with chief complaint(s) of chest pain, shortness of breath, syncopal episode and palpitation with pertinent past medical history of diabetes, hyperlipidemia, DVT and possible stroke which further complicates the presenting complaint. The complaint involves an extensive differential diagnosis and treatment options and also carries with it a high risk of complications and morbidity.  It appears that patient had at least 2 episodes where he had chest discomfort described as heaviness radiating towards his neck and at that time he was having some  visual disturbance and syncope/near syncope type event.  He has history of DVT and is noted to be tachycardic with S1Q3T3 pattern and T wave inversions in the lateral leads.  Additionally, questioning if he had a seizure-like episode earlier today.  Finally, indicates that he continues to have some dizziness and unsteady gait.  Patient's cardiac exam and neuro exam are reassuring.  The differential diagnosis includes: ACS, arrhythmia, PE,  seizures, posterior circulation stroke.  The initial plan is to get basic labs, including troponins and reassess.  Additional Test Considered: Patient is low risk / negative by CT scan of the brain, but there are other possible etiologies that are higher on the differential than stroke, therefore CT scan of the brain will be added only if the initial work-up is negative  Additional history obtained:  Records reviewed Primary Care Documents  Reassessment and review: Lab Tests: I Ordered, and personally interpreted labs.  The pertinent results include: Troponin of 196.  Rest of the labs are reassuring.  No evidence of any severe anemia. Patient's D-dimer was negative, therefore CT PE is not indicated.  Imaging Studies ordered: I independently visualized and interpreted the following imaging X-ray of the chest   which showed no evidence of pneumothorax or pneumonia. I agree with the radiologist interpretation  Critical Interventions:  Medications ordered : aspirin and heparin and consult cardiology  Cardiac Monitoring: The patient was maintained on a cardiac monitor.  I personally viewed and interpreted the cardiac monitor which showed an underlying rhythm of:  sinus rhythm     Reevaluation of the patient after these medicines showed that the patient    stayed the same  Consultations Obtained: I requested consultation with the consultant CARDIOLOGY , and discussed  findings as well as pertinent plan - they recommend admitting the patient to their  service.  Complexity of problems addressed: Patients presentation is most consistent with  acute presentation with potential threat to life or bodily function During patient's assessment  Disposition: After consideration of the diagnostic results and the patients response to treatment,  I feel that the patent would benefit from admission and further cardiac work-up and further syncope work-up .    Final Clinical Impression(s) / ED Diagnoses Final diagnoses:  Syncope and collapse  Seizure (HCC)  Elevated troponin  NSTEMI (non-ST elevated myocardial infarction) [facility])    Rx / DC Orders ED Discharge Orders     None         Varney Biles, MD 03/26/21 1454

## 2021-03-26 NOTE — Progress Notes (Addendum)
ANTICOAGULATION CONSULT NOTE - Initial Consult  Pharmacy Consult for heparin Indication: chest pain/ACS  Allergies  Allergen Reactions   Amoxicillin Hives    Patient Measurements:   Heparin Dosing Weight: 113kg  Vital Signs: Temp: 97.9 F (36.6 C) (01/30 0913) Temp Source: Oral (01/30 0913) BP: 119/98 (01/30 1145) Pulse Rate: 102 (01/30 1145)  Labs: Recent Labs    03/26/21 0926  HGB 15.4  HCT 45.3  PLT 196  CREATININE 1.05  TROPONINIHS 196*    CrCl cannot be calculated (Unknown ideal weight.).   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 Treated with TPA with resolution of symptoms within 20 minutes. Normal neuro evaluation and normal MRI     Assessment: 67 YOM presenting with CP and near syncope, he is not on anticoagulation PTA, CBC wnl  Goal of Therapy:  Heparin level 0.3-0.7 units/ml Monitor platelets by anticoagulation protocol: Yes   Plan:  Heparin 4000 units IV x 1, and gtt at 1400 units/hr F/u 6 hour heparin level F/u cards eval and recs  Bertis Ruddy, PharmD Clinical Pharmacist ED Pharmacist Phone # [phone] 03/26/2021 12:36 PM

## 2021-03-26 NOTE — ED Triage Notes (Signed)
Patient complains of new onset of lightheadedness, shortness of breath, fatigue, loss of hearing, and loss of vision that occurs when patient exerts himself. These episodes started a few months ago, patient came in for evaluation today because these symptoms occurred today while coughing and driving. Patient alert, oriented, speaking in complete sentences, and is in no apparent distress at this time.

## 2021-03-27 ENCOUNTER — Encounter (HOSPITAL_COMMUNITY)
Admission: EM | Disposition: A | Discharge status: Home / Self Care | Payer: Self-pay | Source: Home / Self Care | Attending: Thoracic Surgery (Cardiothoracic Vascular Surgery)

## 2021-03-27 ENCOUNTER — Other Ambulatory Visit (HOSPITAL_COMMUNITY): Payer: Self-pay

## 2021-03-27 DIAGNOSIS — E119 Type 2 diabetes mellitus without complications: Secondary | ICD-10-CM | POA: Diagnosis not present

## 2021-03-27 DIAGNOSIS — I469 Cardiac arrest, cause unspecified: Secondary | ICD-10-CM

## 2021-03-27 DIAGNOSIS — I5021 Acute systolic (congestive) heart failure: Secondary | ICD-10-CM | POA: Diagnosis not present

## 2021-03-27 DIAGNOSIS — I472 Ventricular tachycardia, unspecified: Secondary | ICD-10-CM

## 2021-03-27 DIAGNOSIS — I255 Ischemic cardiomyopathy: Secondary | ICD-10-CM | POA: Diagnosis not present

## 2021-03-27 DIAGNOSIS — I2510 Atherosclerotic heart disease of native coronary artery without angina pectoris: Secondary | ICD-10-CM | POA: Diagnosis not present

## 2021-03-27 DIAGNOSIS — I420 Dilated cardiomyopathy: Secondary | ICD-10-CM

## 2021-03-27 DIAGNOSIS — R55 Syncope and collapse: Secondary | ICD-10-CM | POA: Diagnosis not present

## 2021-03-27 DIAGNOSIS — E785 Hyperlipidemia, unspecified: Secondary | ICD-10-CM

## 2021-03-27 DIAGNOSIS — R778 Other specified abnormalities of plasma proteins: Secondary | ICD-10-CM

## 2021-03-27 DIAGNOSIS — I214 Non-ST elevation (NSTEMI) myocardial infarction: Secondary | ICD-10-CM | POA: Diagnosis not present

## 2021-03-27 DIAGNOSIS — I10 Essential (primary) hypertension: Secondary | ICD-10-CM | POA: Diagnosis not present

## 2021-03-27 HISTORY — PX: LEFT HEART CATH AND CORONARY ANGIOGRAPHY: CATH118249

## 2021-03-27 LAB — HEPATIC FUNCTION PANEL
ALT: 43 U/L (ref 0–44)
AST: 29 U/L (ref 15–41)
Albumin: 3.4 g/dL — ABNORMAL LOW (ref 3.5–5.0)
Alkaline Phosphatase: 61 U/L (ref 38–126)
Bilirubin, Direct: <0.1 mg/dL (ref 0.0–0.2)
Total Bilirubin: 0.8 mg/dL (ref 0.3–1.2)
Total Protein: 6.5 g/dL (ref 6.5–8.1)

## 2021-03-27 LAB — BASIC METABOLIC PANEL
Anion gap: 9 (ref 5–15)
Anion gap: 8 (ref 5–15)
BUN: 13 mg/dL (ref 6–20)
BUN: 11 mg/dL (ref 6–20)
CO2: 23 mmol/L (ref 22–32)
CO2: 24 mmol/L (ref 22–32)
Calcium: 9 mg/dL (ref 8.9–10.3)
Calcium: 8.9 mg/dL (ref 8.9–10.3)
Chloride: 105 mmol/L (ref 98–111)
Chloride: 104 mmol/L (ref 98–111)
Creatinine, Ser: 0.96 mg/dL (ref 0.61–1.24)
Creatinine, Ser: 1.18 mg/dL (ref 0.61–1.24)
GFR, Estimated: >60 mL/min (ref >60)
GFR, Estimated: >60 mL/min (ref >60)
Glucose, Bld: 167 mg/dL — ABNORMAL HIGH (ref 70–99)
Glucose, Bld: 284 mg/dL — ABNORMAL HIGH (ref 70–99)
Potassium: 3.6 mmol/L (ref 3.5–5.1)
Potassium: 3 mmol/L — ABNORMAL LOW (ref 3.5–5.1)
Sodium: 137 mmol/L (ref 135–145)
Sodium: 136 mmol/L (ref 135–145)

## 2021-03-27 LAB — CBC
HCT: 42.3 % (ref 39.0–52.0)
HCT: 47 % (ref 39.0–52.0)
Hemoglobin: 14.4 g/dL (ref 13.0–17.0)
Hemoglobin: 16.2 g/dL (ref 13.0–17.0)
MCH: 32.2 pg (ref 26.0–34.0)
MCH: 32.2 pg (ref 26.0–34.0)
MCHC: 34 g/dL (ref 30.0–36.0)
MCHC: 34.5 g/dL (ref 30.0–36.0)
MCV: 94.6 fL (ref 80.0–100.0)
MCV: 93.4 fL (ref 80.0–100.0)
Platelets: 200 10*3/uL (ref 150–400)
Platelets: 201 10*3/uL (ref 150–400)
RBC: 4.47 MIL/uL (ref 4.22–5.81)
RBC: 5.03 MIL/uL (ref 4.22–5.81)
RDW: 12.8 % (ref 11.5–15.5)
RDW: 12.7 % (ref 11.5–15.5)
WBC: 5 10*3/uL (ref 4.0–10.5)
WBC: 8.3 10*3/uL (ref 4.0–10.5)
nRBC: 0 % (ref 0.0–0.2)
nRBC: 0 % (ref 0.0–0.2)

## 2021-03-27 LAB — RAPID URINE DRUG SCREEN, HOSP PERFORMED
Amphetamines: NOT DETECTED
Barbiturates: NOT DETECTED
Benzodiazepines: NOT DETECTED
Cocaine: NOT DETECTED
Opiates: NOT DETECTED
Tetrahydrocannabinol: NOT DETECTED

## 2021-03-27 LAB — GLUCOSE, CAPILLARY
Glucose-Capillary: 184 mg/dL — ABNORMAL HIGH (ref 70–99)
Glucose-Capillary: 374 mg/dL — ABNORMAL HIGH (ref 70–99)
Glucose-Capillary: 234 mg/dL — ABNORMAL HIGH (ref 70–99)

## 2021-03-27 LAB — HEPARIN LEVEL (UNFRACTIONATED): Heparin Unfractionated: 0.38 IU/mL (ref 0.30–0.70)

## 2021-03-27 LAB — MAGNESIUM: Magnesium: 1.9 mg/dL (ref 1.7–2.4)

## 2021-03-27 LAB — CK: Total CK: 301 U/L (ref 49–397)

## 2021-03-27 LAB — LIPID PANEL
Cholesterol: 151 mg/dL (ref 0–200)
HDL: 47 mg/dL (ref >40)
LDL Cholesterol: 89 mg/dL (ref 0–99)
Total CHOL/HDL Ratio: 3.2 RATIO
Triglycerides: 76 mg/dL (ref <150)
VLDL: 15 mg/dL (ref 0–40)

## 2021-03-27 LAB — SURGICAL PCR SCREEN
MRSA, PCR: NEGATIVE
Staphylococcus aureus: NEGATIVE

## 2021-03-27 LAB — CBG MONITORING, ED: Glucose-Capillary: 149 mg/dL — ABNORMAL HIGH (ref 70–99)

## 2021-03-27 SURGERY — LEFT HEART CATH AND CORONARY ANGIOGRAPHY
Anesthesia: LOCAL

## 2021-03-27 MED ORDER — LIDOCAINE HCL (PF) 1 % IJ SOLN
INTRAMUSCULAR | Status: DC | PRN
  Administered 2021-03-27: 4 mL

## 2021-03-27 MED ORDER — ACETAMINOPHEN 325 MG PO TABS
650.0000 mg | ORAL_TABLET | ORAL | Status: DC | PRN
  Administered 2021-03-27: 650 mg via ORAL

## 2021-03-27 MED ORDER — MIDAZOLAM HCL 2 MG/2ML IJ SOLN
INTRAMUSCULAR | Status: DC | PRN
  Administered 2021-03-27: 1 mg via INTRAVENOUS

## 2021-03-27 MED ORDER — LOSARTAN POTASSIUM 25 MG PO TABS
25.0000 mg | ORAL_TABLET | Freq: Every day | ORAL | Status: DC
  Administered 2021-03-28: 25 mg via ORAL
  Filled 2021-03-27 (×2): qty 1

## 2021-03-27 MED ORDER — HEPARIN SODIUM (PORCINE) 1000 UNIT/ML IJ SOLN
INTRAMUSCULAR | Status: AC
  Filled 2021-03-27: qty 10

## 2021-03-27 MED ORDER — AMIODARONE HCL IN DEXTROSE 360-4.14 MG/200ML-% IV SOLN
30.0000 mg/h | INTRAVENOUS | Status: DC
  Administered 2021-03-27 – 2021-03-29 (×3): 30 mg/h via INTRAVENOUS
  Filled 2021-03-27 (×4): qty 200

## 2021-03-27 MED ORDER — FENTANYL CITRATE (PF) 100 MCG/2ML IJ SOLN
INTRAMUSCULAR | Status: DC | PRN
  Administered 2021-03-27: 25 ug via INTRAVENOUS

## 2021-03-27 MED ORDER — SODIUM CHLORIDE 0.9 % IV SOLN: INTRAVENOUS | Status: AC

## 2021-03-27 MED ORDER — SODIUM CHLORIDE 0.9 % IV SOLN: 250.0000 mL | INTRAVENOUS | Status: DC | PRN

## 2021-03-27 MED ORDER — SODIUM CHLORIDE 0.9 % IV SOLN: INTRAVENOUS | Status: DC

## 2021-03-27 MED ORDER — HEPARIN SODIUM (PORCINE) 1000 UNIT/ML IJ SOLN
INTRAMUSCULAR | Status: DC | PRN
  Administered 2021-03-27: 6000 [IU] via INTRAVENOUS

## 2021-03-27 MED ORDER — CHLORHEXIDINE GLUCONATE CLOTH 2 % EX PADS
6.0000 | MEDICATED_PAD | Freq: Every day | CUTANEOUS | Status: DC
  Administered 2021-03-27 – 2021-03-29 (×3): 6 via TOPICAL

## 2021-03-27 MED ORDER — HEPARIN (PORCINE) IN NACL 1000-0.9 UT/500ML-% IV SOLN
INTRAVENOUS | Status: DC | PRN
  Administered 2021-03-27 (×2): 500 mL

## 2021-03-27 MED ORDER — IOHEXOL 350 MG/ML SOLN
INTRAVENOUS | Status: DC | PRN
  Administered 2021-03-27: 60 mL

## 2021-03-27 MED ORDER — VERAPAMIL HCL 2.5 MG/ML IV SOLN
INTRAVENOUS | Status: DC | PRN
  Administered 2021-03-27: 10 mL via INTRA_ARTERIAL

## 2021-03-27 MED ORDER — SODIUM CHLORIDE 0.9% FLUSH
3.0000 mL | Freq: Two times a day (BID) | INTRAVENOUS | Status: DC
  Administered 2021-03-28: 3 mL via INTRAVENOUS

## 2021-03-27 MED ORDER — VERAPAMIL HCL 2.5 MG/ML IV SOLN
INTRAVENOUS | Status: AC
  Filled 2021-03-27: qty 2

## 2021-03-27 MED ORDER — LIDOCAINE HCL (PF) 1 % IJ SOLN
INTRAMUSCULAR | Status: AC
  Filled 2021-03-27: qty 30

## 2021-03-27 MED ORDER — AMIODARONE LOAD VIA INFUSION
150.0000 mg | Freq: Once | INTRAVENOUS | Status: AC
  Administered 2021-03-27: 150 mg via INTRAVENOUS
  Filled 2021-03-27: qty 83.34

## 2021-03-27 MED ORDER — HEPARIN (PORCINE) IN NACL 1000-0.9 UT/500ML-% IV SOLN
INTRAVENOUS | Status: AC
  Filled 2021-03-27: qty 500

## 2021-03-27 MED ORDER — POTASSIUM CHLORIDE CRYS ER 20 MEQ PO TBCR
40.0000 meq | EXTENDED_RELEASE_TABLET | Freq: Once | ORAL | Status: AC
Start: 2021-03-27 — End: 2021-03-27
  Administered 2021-03-27: 40 meq via ORAL
  Filled 2021-03-27: qty 2

## 2021-03-27 MED ORDER — HEPARIN (PORCINE) 25000 UT/250ML-% IV SOLN
1650.0000 [IU]/h | INTRAVENOUS | Status: DC
  Administered 2021-03-27 – 2021-03-28 (×3): 1650 [IU]/h via INTRAVENOUS
  Filled 2021-03-27 (×2): qty 250

## 2021-03-27 MED ORDER — LABETALOL HCL 5 MG/ML IV SOLN: 10.0000 mg | INTRAVENOUS | Status: AC | PRN

## 2021-03-27 MED ORDER — POTASSIUM CHLORIDE CRYS ER 20 MEQ PO TBCR
40.0000 meq | EXTENDED_RELEASE_TABLET | Freq: Once | ORAL | Status: AC
  Administered 2021-03-27: 40 meq via ORAL
  Filled 2021-03-27: qty 2

## 2021-03-27 MED ORDER — TRAMADOL HCL 50 MG PO TABS
50.0000 mg | ORAL_TABLET | Freq: Four times a day (QID) | ORAL | Status: DC | PRN
  Administered 2021-03-27: 100 mg via ORAL
  Administered 2021-03-28: 50 mg via ORAL
  Administered 2021-03-28: 100 mg via ORAL
  Filled 2021-03-27: qty 2
  Filled 2021-03-27: qty 1
  Filled 2021-03-27: qty 2

## 2021-03-27 MED ORDER — SODIUM CHLORIDE 0.9% FLUSH: 3.0000 mL | INTRAVENOUS | Status: DC | PRN

## 2021-03-27 MED ORDER — CARVEDILOL 3.125 MG PO TABS
3.1250 mg | ORAL_TABLET | Freq: Two times a day (BID) | ORAL | Status: DC
  Administered 2021-03-27 – 2021-03-28 (×3): 3.125 mg via ORAL
  Filled 2021-03-27 (×3): qty 1

## 2021-03-27 MED ORDER — INSULIN GLARGINE-YFGN 100 UNIT/ML ~~LOC~~ SOLN
15.0000 [IU] | Freq: Every day | SUBCUTANEOUS | Status: DC
  Administered 2021-03-27 – 2021-03-28 (×2): 15 [IU] via SUBCUTANEOUS
  Filled 2021-03-27 (×5): qty 0.15

## 2021-03-27 MED ORDER — AMIODARONE HCL IN DEXTROSE 360-4.14 MG/200ML-% IV SOLN
60.0000 mg/h | INTRAVENOUS | Status: AC
  Administered 2021-03-27 (×2): 60 mg/h via INTRAVENOUS
  Filled 2021-03-27: qty 200

## 2021-03-27 MED ORDER — MAGNESIUM SULFATE 2 GM/50ML IV SOLN
2.0000 g | Freq: Once | INTRAVENOUS | Status: AC
  Administered 2021-03-27: 2 g via INTRAVENOUS
  Filled 2021-03-27: qty 50

## 2021-03-27 MED ORDER — MIDAZOLAM HCL 2 MG/2ML IJ SOLN
INTRAMUSCULAR | Status: AC
  Filled 2021-03-27: qty 2

## 2021-03-27 MED ORDER — FENTANYL CITRATE (PF) 100 MCG/2ML IJ SOLN
INTRAMUSCULAR | Status: AC
  Filled 2021-03-27: qty 2

## 2021-03-27 MED ORDER — HYDRALAZINE HCL 20 MG/ML IJ SOLN: 10.0000 mg | INTRAMUSCULAR | Status: AC | PRN

## 2021-03-27 MED ORDER — AMIODARONE HCL IN DEXTROSE 360-4.14 MG/200ML-% IV SOLN
INTRAVENOUS | Status: AC
  Filled 2021-03-27: qty 200

## 2021-03-27 SURGICAL SUPPLY — 11 items

## 2021-03-27 NOTE — TOC Benefit Eligibility Note (Addendum)
Patient Advocate Encounter   Received notification that prior authorization for Entresto 24-26 mg is required.   PA submitted on 03/27/2021 Key [id number] Status is pending       Lyndel Safe, [place] Patient Advocate Specialist [place] Patient Advocate Team Direct Number: [phone]  Fax: [phone]

## 2021-03-27 NOTE — Significant Event (Signed)
Significant Event Note  Patient Name: Gene Weeks Date of Encounter: 03/27/2021  [facility] Cardiologist: Peter [place], MD   Subjective   Has some chest pain   Inpatient Medications    Scheduled Meds:  aspirin EC  81 mg Oral Daily   atorvastatin  80 mg Oral Daily   carvedilol  3.125 mg Oral BID WC   dapagliflozin propanediol  10 mg Oral Daily   insulin aspart  0-15 Units Subcutaneous TID WC   insulin glargine-yfgn  15 Units Subcutaneous QHS   [START ON 03/28/2021] losartan  25 mg Oral Daily   metoprolol tartrate  12.5 mg Oral BID   sodium chloride flush  3 mL Intravenous Q12H   Continuous Infusions:  sodium chloride 50 mL/hr at 03/27/21 0636   amiodarone 60 mg/hr (03/27/21 1502)   Followed by   amiodarone     heparin 1,650 Units/hr (03/27/21 1617)   PRN Meds: acetaminophen, acetaminophen, nitroGLYCERIN, ondansetron (ZOFRAN) IV   Vital Signs    Vitals:   03/27/21 1435 03/27/21 1505 03/27/21 1535 03/27/21 1537  BP: (!) 148/100 (!) 143/89 107/67   Pulse: 95 96 95   Resp: 18 (!) 25 18   Temp:      TempSrc:      SpO2: 98% 99% 99% 99%  Weight:      Height:        Intake/Output Summary (Last 24 hours) at 03/27/2021 1617 Last data filed at 03/27/2021 1200 Gross per 24 hour  Intake 240 ml  Output 1950 ml  Net -1710 ml   Last 3 Weights 03/26/2021 06/23/2020 03/21/2020  Weight (lbs) 265 lb 293 lb 6.4 oz 290 lb 6.4 oz  Weight (kg) 120.203 kg 133.085 kg 131.725 kg      Telemetry    SR with PVCs - Personally Reviewed  ECG    SR with freq PVCs no acute ST changes  - Personally Reviewed  Physical Exam   GEN: No acute distress.  Though does have chest pain Neck: No JVD Cardiac: RRR, no murmurs, rubs, or gallops.  Respiratory: Clear to auscultation bilaterally. GI: Soft, nontender, non-distended  MS: No edema; No deformity. Neuro:  Nonfocal  Psych: Normal affect   Labs    High Sensitivity Troponin:   Recent Labs  Lab 03/26/21 0926 03/26/21 1144   TROPONINIHS 196* 263*     Chemistry Recent Labs  Lab 03/26/21 0926 03/27/21 0422  NA 138 137  K 3.5 3.6  CL 103 105  CO2 24 23  GLUCOSE 240* 167*  BUN 12 13  CREATININE 1.05 0.96  CALCIUM 9.3 9.0  PROT 7.1 6.5  ALBUMIN 3.9 3.4*  AST 68* 29  ALT 59* 43  ALKPHOS 66 61  BILITOT 0.7 0.8  GFRNONAA >60 >60  ANIONGAP 11 9    Lipids  Recent Labs  Lab 03/27/21 0422  CHOL 151  TRIG 76  HDL 47  LDLCALC 89  CHOLHDL 3.2    Hematology Recent Labs  Lab 03/26/21 0926 03/27/21 0422 03/27/21 1536  WBC 6.1 5.0 8.3  RBC 4.79 4.47 5.03  HGB 15.4 14.4 16.2  HCT 45.3 42.3 47.0  MCV 94.6 94.6 93.4  MCH 32.2 32.2 32.2  MCHC 34.0 34.0 34.5  RDW 12.8 12.8 12.7  PLT 196 200 201   Thyroid No results for input(s): TSH, FREET4 in the last 168 hours.  BNPNo results for input(s): BNP, PROBNP in the last 168 hours.  DDimer  Recent Labs  Lab 03/26/21 1101  DDIMER 0.35     Radiology    DG Chest 2 View  Result Date: 03/26/2021 CLINICAL DATA:  Chest pain EXAM: CHEST - 2 VIEW COMPARISON:  07/14/2018 FINDINGS: The heart size and mediastinal contours are within normal limits. Both lungs are clear. The visualized skeletal structures are unremarkable. IMPRESSION: No active cardiopulmonary disease. Electronically Signed   By: Misty Stanley M.D.   On: 03/26/2021 09:40   CARDIAC CATHETERIZATION  Result Date: 03/27/2021   Prox RCA to Dist RCA lesion is 100% stenosed.  Faint left-to-right collaterals to the PDA system   Prox Cx lesion is 99% stenosed with 99% stenosed side branch in 1st Mrg. -> 1st Mrg lesion is 80% stenosed.   Beyond 1st Mrg: Prox Cx to Dist Cx lesion is 100% stenosed with 100% stenosed side branch in 2nd Mrg. (2nd Mrg fills via faint left-left collaterals distally)   Ramus lesion is 95% stenosed.  (Small caliber vessel)   Prox LAD to Mid LAD lesion is 55% stenosed.  Prior to 1st Diag   Mid LAD to Dist LAD lesion is 75% stenosed - beyond 1st Diag   Dist (Apical) LAD lesion is  90% stenosed with 90% stenosed side branch in 2nd Diag.   LV end diastolic pressure is normal.   There is no aortic valve stenosis.   Severely Reduced LVEF by Echocardiogram SUMMARY Severe Multivessel Occlusive CAD: Large RCA 100% proximal CTO (heavily calcified) -faint left-to-right collaterals to the PDA LCx occluded after OM1 with faint collaterals filling OM2; OM1 has bifurcation 99% stenosis followed by 80% stenosis Small caliber bifurcating Ramus Intermedius with ostial proximal 95% stenosis (likely too small for revascularization) Moderate caliber LAD with diffuse segmental eccentric calcified 55 to 60% proximal stenosis prior to major D1 and mid vessel.  Beyond D1 75% segmental stenosis, and 95% apical Normal LVEDP with severely reduced EF of 20 to 25% by echocardiogram-WITH INFERIOR WALL AKINESIS and ANTERIOR HYPOKINESIS RECOMMENDATIONS Admit to inpatient unit, restart IV heparin CVTS consultation with reduced EF and multivessel disease in a diabetic. Images reviewed with Dr. Martinique Convert from Lopressor to carvedilol, as blood pressure tolerates start initiating [place] for ISCHEMIC CARDIOMYOPATHY/CAD -> Entresto +/- Spironolactone if possible prior to d/c,, continue SGLT2-I High-dose high intensity statin, and aggressive glycemic control Glenetta Hew, MD  ECHOCARDIOGRAM COMPLETE  Result Date: 03/26/2021    ECHOCARDIOGRAM REPORT   Patient Name:   Gene Weeks Date of Exam: 03/26/2021 Medical Rec #:  [id number]     Height:       77.0 in Accession #:    [id number]    Weight:       265.0 lb Date of Birth:  05/09/1975     BSA:          2.521 m Patient Age:    46 years      BP:           106/82 mmHg Patient Gender: M             HR:           88 bpm. Exam Location:  Inpatient Procedure: 2D Echo, 3D Echo, Cardiac Doppler, Color Doppler and Intracardiac            Opacification Agent Indications:    122-I22.9 Subsequent ST elevation (STEM) and non-ST elevation                  (NSTEMI) myocardial infarction  History:        Patient  has no prior history of Echocardiogram examinations.                 Signs/Symptoms:Shortness of Breath, Chest Pain and Syncope; Risk                 Factors:Hypertension and Diabetes.  Sonographer:    Roseanna Rainbow RDCS Referring Phys: [id number] [place]  1. Left ventricular ejection fraction, by estimation, is 20 to 25%. The left ventricle has severely decreased function. The left ventricle demonstrates regional wall motion abnormalities (see scoring diagram/findings for description). The left ventricular internal cavity size was mildly dilated. Left ventricular diastolic parameters are consistent with Grade I diastolic dysfunction (impaired relaxation).  2. Right ventricular systolic function is normal. The right ventricular size is normal.  3. The mitral valve is normal in structure. No evidence of mitral valve regurgitation. No evidence of mitral stenosis.  4. The aortic valve is normal in structure. Aortic valve regurgitation is not visualized. Aortic valve sclerosis is present, with no evidence of aortic valve stenosis.  5. The inferior vena cava is normal in size with greater than 50% respiratory variability, suggesting right atrial pressure of 3 mmHg. Comparison(s): No prior Echocardiogram. FINDINGS  Left Ventricle: Left ventricular ejection fraction, by estimation, is 20 to 25%. The left ventricle has severely decreased function. The left ventricle demonstrates regional wall motion abnormalities. Definity contrast agent was given IV to delineate the left ventricular endocardial borders. The left ventricular internal cavity size was mildly dilated. There is no left ventricular hypertrophy. Left ventricular diastolic parameters are consistent with Grade I diastolic dysfunction (impaired relaxation).  LV Wall Scoring: The basal inferolateral segment, basal inferior segment, and basal inferoseptal segment are akinetic. The entire anterior  wall, antero-lateral wall, mid and distal lateral wall, entire anterior septum, entire apex, mid and distal inferior wall, and mid inferoseptal segment are hypokinetic. Right Ventricle: The right ventricular size is normal. No increase in right ventricular wall thickness. Right ventricular systolic function is normal. Left Atrium: Left atrial size was normal in size. Right Atrium: Right atrial size was normal in size. Pericardium: There is no evidence of pericardial effusion. Mitral Valve: The mitral valve is normal in structure. No evidence of mitral valve regurgitation. No evidence of mitral valve stenosis. Tricuspid Valve: The tricuspid valve is normal in structure. Tricuspid valve regurgitation is not demonstrated. No evidence of tricuspid stenosis. Aortic Valve: The aortic valve is normal in structure. Aortic valve regurgitation is not visualized. Aortic valve sclerosis is present, with no evidence of aortic valve stenosis. Pulmonic Valve: The pulmonic valve was normal in structure. Pulmonic valve regurgitation is not visualized. No evidence of pulmonic stenosis. Aorta: The aortic root is normal in size and structure. Venous: The inferior vena cava is normal in size with greater than 50% respiratory variability, suggesting right atrial pressure of 3 mmHg. IAS/Shunts: No atrial level shunt detected by color flow Doppler.  LEFT VENTRICLE PLAX 2D LVIDd:         6.30 cm      Diastology LVIDs:         5.70 cm      LV e' medial:    7.20 cm/s LV PW:         0.90 cm      LV E/e' medial:  10.3 LV IVS:        1.00 cm      LV e' lateral:   8.18 cm/s LVOT diam:     2.30 cm  LV E/e' lateral: 9.1 LV SV:         62 LV SV Index:   25 LVOT Area:     4.15 cm                              3D Volume EF: LV Volumes (MOD)            3D EF:        30 % LV vol d, MOD A2C: 243.0 ml LV EDV:       238 ml LV vol d, MOD A4C: 232.0 ml LV ESV:       166 ml LV vol s, MOD A2C: 191.0 ml LV SV:        72 ml LV vol s, MOD A4C: 180.0 ml LV SV  MOD A2C:     52.0 ml LV SV MOD A4C:     232.0 ml LV SV MOD BP:      52.5 ml RIGHT VENTRICLE             IVC RV S prime:     11.20 cm/s  IVC diam: 2.30 cm TAPSE (M-mode): 1.4 cm LEFT ATRIUM             Index        RIGHT ATRIUM           Index LA diam:        4.10 cm 1.63 cm/m   RA Area:     14.50 cm LA Vol (A2C):   44.4 ml 17.61 ml/m  RA Volume:   32.60 ml  12.93 ml/m LA Vol (A4C):   67.7 ml 26.85 ml/m LA Biplane Vol: 57.6 ml 22.85 ml/m  AORTIC VALVE LVOT Vmax:   83.30 cm/s LVOT Vmean:  55.400 cm/s LVOT VTI:    0.150 m  AORTA Ao Root diam: 3.30 cm Ao Asc diam:  3.20 cm MITRAL VALVE MV Area (PHT): 4.49 cm    SHUNTS MV Decel Time: 169 msec    Systemic VTI:  0.15 m MV E velocity: 74.17 cm/s  Systemic Diam: 2.30 cm MV A velocity: 78.17 cm/s MV E/A ratio:  0.95 Kardie Tobb DO Electronically signed by Berniece Salines DO Signature Date/Time: 03/26/2021/3:29:05 PM    Final     Cardiac Studies   Echo: IMPRESSIONS     1. Left ventricular ejection fraction, by estimation, is 20 to 25%. The  left ventricle has severely decreased function. The left ventricle  demonstrates regional wall motion abnormalities (see scoring  diagram/findings for description). The left  ventricular internal cavity size was mildly dilated. Left ventricular  diastolic parameters are consistent with Grade I diastolic dysfunction  (impaired relaxation).   2. Right ventricular systolic function is normal. The right ventricular  size is normal.   3. The mitral valve is normal in structure. No evidence of mitral valve  regurgitation. No evidence of mitral stenosis.   4. The aortic valve is normal in structure. Aortic valve regurgitation is  not visualized. Aortic valve sclerosis is present, with no evidence of  aortic valve stenosis.   5. The inferior vena cava is normal in size with greater than 50%  respiratory variability, suggesting right atrial pressure of 3 mmHg.   Comparison(s): No prior Echocardiogram.    Cardiac  cath: LEFT HEART CATH AND CORONARY ANGIOGRAPHY    Conclusion       Prox RCA to Dist RCA lesion is 100%  stenosed.  Faint left-to-right collaterals to the PDA system   Prox Cx lesion is 99% stenosed with 99% stenosed side branch in 1st Mrg. -> 1st Mrg lesion is 80% stenosed.   Beyond 1st Mrg: Prox Cx to Dist Cx lesion is 100% stenosed with 100% stenosed side branch in 2nd Mrg. (2nd Mrg fills via faint left-left collaterals distally)   Ramus lesion is 95% stenosed.  (Small caliber vessel)   Prox LAD to Mid LAD lesion is 55% stenosed.  Prior to 1st Diag   Mid LAD to Dist LAD lesion is 75% stenosed - beyond 1st Diag   Dist (Apical) LAD lesion is 90% stenosed with 90% stenosed side branch in 2nd Diag.   LV end diastolic pressure is normal.   There is no aortic valve stenosis.   Severely Reduced LVEF by Echocardiogram   SUMMARY Severe Multivessel Occlusive CAD: Large RCA 100% proximal CTO (heavily calcified) -faint left-to-right collaterals to the PDA LCx occluded after OM1 with faint collaterals filling OM2; OM1 has bifurcation 99% stenosis followed by 80% stenosis Small caliber bifurcating Ramus Intermedius with ostial proximal 95% stenosis (likely too small for revascularization) Moderate caliber LAD with diffuse segmental eccentric calcified 55 to 60% proximal stenosis prior to major D1 and mid vessel.  Beyond D1 75% segmental stenosis, and 95% apical Normal LVEDP with severely reduced EF of 20 to 25% by echocardiogram-WITH INFERIOR WALL AKINESIS and ANTERIOR HYPOKINESIS   RECOMMENDATIONS Admit to inpatient unit, restart IV heparin CVTS consultation with reduced EF and multivessel disease in a diabetic. Images reviewed with Dr. [place] Convert from Lopressor to carvedilol, as blood pressure tolerates start initiating [place] for ISCHEMIC CARDIOMYOPATHY/CAD -> Entresto +/- Spironolactone if possible prior to d/c,, continue SGLT2-I High-dose high intensity statin,  and aggressive glycemic control     Glenetta Hew, MD    Diagnostic Dominance: Right Intervention   Patient Profile     46 y.o. male with long history of HTN and IDDM presents with NSTEMI and near syncope  Assess ment & Plan  Post cardiac cath pt did have VT asymptomatic short burst.  Since admit was for near syncope while driving.  Pt bolused with IV amiodarone and drip started .  On arrival to his room pt went into V tach and was unresponsive.     Total of 5 min CPR and did receive a dose of EPI  woke and is  oriented X 3.  Follows commands .    For questions or updates, please contact [place] Please consult [email or web address] for contact info under        Signed, Cecilie Kicks, NP  03/27/2021, 4:17 PM

## 2021-03-27 NOTE — Progress Notes (Signed)
ANTICOAGULATION CONSULT NOTE - Initial Consult  Pharmacy Consult for heparin Indication: chest pain/ACS  Allergies  Allergen Reactions   Amoxicillin Hives    Patient Measurements: Height: 6\' 5"  (195.6 cm) Weight: 120.2 kg (265 lb) IBW/kg (Calculated) : 89.1 Heparin Dosing Weight: 113kg  Vital Signs: Temp: 98.2 F (36.8 C) (01/31 0639) Temp Source: Oral (01/31 0639) BP: 153/93 (01/31 0850) Pulse Rate: 101 (01/31 0850)  Labs: Recent Labs    03/26/21 0926 03/26/21 1144 03/26/21 2113 03/27/21 0422  HGB 15.4  --   --  14.4  HCT 45.3  --   --  42.3  PLT 196  --   --  200  HEPARINUNFRC  --   --  0.14* 0.38  CREATININE 1.05  --   --  0.96  CKTOTAL  --   --   --  301  TROPONINIHS 196* 263*  --   --      Estimated Creatinine Clearance: 139.5 mL/min (by C-G formula based on SCr of 0.96 mg/dL).   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 December 2013 with acute weakness and numbness involving his right side. Treated with TPA with resolution of symptoms within 20 minutes. Normal neuro evaluation and normal MRI     Assessment: 45 YOM presenting with CP and near syncope, he is not on anticoagulation PTA, CBC wnl  Heparin level therapeutic on 1650 units/h this am. Pt now s/p cath with plans for CVTS workup. Heparin to resume 8h after sheath removal (0830).  Goal of Therapy:  Heparin level 0.3-0.7 units/ml Monitor platelets by anticoagulation protocol: Yes   Plan:  Resume heparin 1650 units/h no bolus at 1630 Repeat heparin level with morning labs  January 2014, PharmD, BCPS, [facility] Clinical Pharmacist [phone] Please check AMION for all [facility]  Pharmacy numbers 03/27/2021

## 2021-03-27 NOTE — TOC Benefit Eligibility Note (Signed)
Patient Product/process development scientist completed.    The patient is currently admitted and upon discharge could be taking Entresto 24-26 mg.  Prior Authorization Required  The patient is insured through [employer]     Roland Earl, CPhT Pharmacy Patient Advocate Specialist [facility] Patient Advocate Team Direct Number: [phone]  Fax: [phone]

## 2021-03-27 NOTE — Progress Notes (Signed)
Report and care received from [facility]. Patient resting comfortably at this time. Rt Radial site remains soft with no active bleeding or hematoma noted. Continue to monitor patient.
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
## 2021-03-27 NOTE — Progress Notes (Signed)
Patient continues to rest in bed. Rt radial remains soft with no active bleeding or hematoma noted. Will continue to monitor patient.

## 2021-03-27 NOTE — Interval H&P Note (Signed)
History and Physical Interval Note:  03/27/2021 7:39 AM  Yassir Butkowski  has presented today for surgery, with the diagnosis of nstemi - Cardiomyopathy.  The various methods of treatment have been discussed with the patient and family. After consideration of risks, benefits and other options for treatment, the patient has consented to  Procedure(s): LEFT HEART CATH AND CORONARY ANGIOGRAPHY (N/A)  PERCUTANEOUS CORONARY INTERVENTION  as a surgical intervention.  The patient's history has been reviewed, patient examined, no change in status, stable for surgery.  I have reviewed the patient's chart and labs.  Questions were answered to the patient's satisfaction.    Cath Lab Visit (complete for each Cath Lab visit)  Clinical Evaluation Leading to the Procedure:   ACS: Yes.    Non-ACS:    Anginal Classification: CCS III  Anti-ischemic medical therapy: Minimal Therapy (1 class of medications)  Non-Invasive Test Results: High-risk stress test findings: cardiac mortality >3%/year - ECHO with Severely Reduced EF.  Prior CABG: No previous CABG    Glenetta Hew

## 2021-03-27 NOTE — Progress Notes (Signed)
°   03/27/21 1500  Clinical Encounter Type  Visited With Patient not available  Visit Type Initial;Code  Referral From Nurse  Consult/Referral To None   Chaplain responded to a code blue alert. There was no family present.  Advised the nurse that if family arrived and requested a chaplain to please call [place].   Valerie Roys Chaplain Resident  [facility]  [phone]

## 2021-03-27 NOTE — Progress Notes (Addendum)
°   03/27/21 1525  Clinical Encounter Type  Visited With Patient not available  Visit Type Initial;Code  Referral From Nurse  Consult/Referral To None   Chaplain responded to a code blue alert. There was no family present.  Advised the nurse that if family arrived and requested a chaplain to please call us.  Entry in column for 1500 was in error. This is the correct timeframe.   Valerie Roys Chaplain Resident  [facility]  [phone]

## 2021-03-27 NOTE — Progress Notes (Signed)
°  Patient Name: Gene Weeks   MRN: [id number]   Date of Birth/ Sex: 1975-07-27 , male      Admission Date: 03/26/2021  Attending Provider: Lequita Halt, MD  Primary Diagnosis: Syncope and collapse    Indication: Pt was in his usual state of health until this PM, when he was noted to be in [place] after LH/RHC. As patient was being brought to his room on the floor he lost pulse. Reportedly noted to be in [place]. Code blue was subsequently called. At the time of arrival on scene, ACLS protocol was underway.   Technical Description:  - CPR performance duration:  15 minutes  - Was defibrillation or cardioversion used? No   - Was external pacer placed? No  - Was patient intubated pre/post CPR? No   Medications Administered: Y = Yes; Blank = No Amiodarone  Y  Atropine    Calcium    Epinephrine    Lidocaine    Magnesium    Norepinephrine    Phenylephrine    Sodium bicarbonate    Vasopressin     Post CPR evaluation:  - Final Status - Was patient successfully resuscitated ? Yes - What is current rhythm? NSR - What is current hemodynamic status? HDS  Miscellaneous Information:  - Labs sent, including: CBC, BMP, Mg  - Primary team notified?  Attempted to notify primary team, unable to get contact immediately after ROSC achieved   - Family Notified? Yes  - Additional notes/ transfer status: Transfer to ICU for further monitoring.      Rick Duff, MD  03/27/2021, 4:13 PM

## 2021-03-27 NOTE — ED Notes (Signed)
Patient short of breath when trying to stand

## 2021-03-27 NOTE — Progress Notes (Signed)
°  ° °  Pt post cath was on phone and developed V Tach.  Pt asymptomatic.  Reviewed with Dr. [place] and will add IV amiodarone and drip.   Nada Boozer, FNP-C At [facility]  Pgr:[phone] or after 5pm and on weekends call [phone] 03/27/2021.now

## 2021-03-27 NOTE — Progress Notes (Signed)
TCTS consulted for CABG evaluation. °
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
## 2021-03-27 NOTE — Progress Notes (Signed)
°  Transition of Care [facility]) Screening Note   Patient Details  Name: Gene Weeks Date of Birth: 10-21-1975   Transition of Care [facility]) CM/SW Contact:    Delilah Shan, LCSWA Phone Number: 03/27/2021, 5:15 PM    Transition of Care Department [facility]) has reviewed patient and no TOC needs have been identified at this time. We will continue to monitor patient advancement through interdisciplinary progression rounds. If new patient transition needs arise, please place a TOC consult.

## 2021-03-27 NOTE — Progress Notes (Signed)
LOCATION:  right RADIAL  DEFLATED PER PROTOCOL: YES  TIME BAND OFF/DRESSING APPLIED: 1031  SITE UPON ARRIVAL: LEVEL 0  SITE AFTER BAND REMOVAL: LEVEL 0  CIRCULATION SENSATION AND MOVEMENT: + movement and sensation, right radial pulse at +2  COMMENTS:  post instruction given

## 2021-03-27 NOTE — Progress Notes (Signed)
Progress Note  Patient Name: Gene Weeks Date of Encounter: 03/27/2021  [facility] Cardiologist: Jamicheal Heard [place], MD   Subjective   Feels well, denies any chest pain or SOB  Inpatient Medications    Scheduled Meds:  [MAR Hold] aspirin  324 mg Oral NOW   Or   [MAR Hold] aspirin  300 mg Rectal NOW   [MAR Hold] aspirin EC  81 mg Oral Daily   [MAR Hold] atorvastatin  80 mg Oral Daily   [MAR Hold] dapagliflozin propanediol  10 mg Oral Daily   [MAR Hold] insulin aspart  0-15 Units Subcutaneous TID WC   [MAR Hold] Insulin Glargine-Lixisenatide  21 Units Subcutaneous QHS   [START ON 03/28/2021] losartan  25 mg Oral Daily   [MAR Hold] metoprolol tartrate  12.5 mg Oral BID   [MAR Hold] sodium chloride flush  3 mL Intravenous Q12H   Continuous Infusions:  sodium chloride     sodium chloride 50 mL/hr at 03/27/21 0636   sodium chloride 75 mL/hr at 03/27/21 0852   heparin     PRN Meds: sodium chloride, [MAR Hold] acetaminophen, acetaminophen, fentaNYL, Heparin (Porcine) in NaCl, heparin sodium (porcine), hydrALAZINE, iohexol, labetalol, lidocaine (PF), midazolam, [MAR Hold] nitroGLYCERIN, [MAR Hold] ondansetron (ZOFRAN) IV, Radial Cocktail/Verapamil only, sodium chloride flush   Vital Signs    Vitals:   03/27/21 0910 03/27/21 0925 03/27/21 0940 03/27/21 1010  BP: (!) 145/103 (!) 156/103 (!) 149/92 (!) 147/95  Pulse: 90 98 92 86  Resp: 20 16 (!) 22 (!) 21  Temp:      TempSrc:      SpO2: 99% 100% 100% 100%  Weight:      Height:        Intake/Output Summary (Last 24 hours) at 03/27/2021 1136 Last data filed at 03/27/2021 1000 Gross per 24 hour  Intake 240 ml  Output 1250 ml  Net -1010 ml   Last 3 Weights 03/26/2021 06/23/2020 03/21/2020  Weight (lbs) 265 lb 293 lb 6.4 oz 290 lb 6.4 oz  Weight (kg) 120.203 kg 133.085 kg 131.725 kg      Telemetry    NSR - Personally Reviewed  ECG    NSR, inferior Q waves, T wave inversion in anterolateral leads.  - Personally  Reviewed  Physical Exam   GEN: No acute distress.   Neck: No JVD Cardiac: RRR, no murmurs, rubs, or gallops.  Respiratory: Clear to auscultation bilaterally. GI: Soft, nontender, non-distended  MS: No edema; No deformity. Radial band in place.  Neuro:  Nonfocal  Psych: Normal affect   Labs    High Sensitivity Troponin:   Recent Labs  Lab 03/26/21 0926 03/26/21 1144  TROPONINIHS 196* 263*     Chemistry Recent Labs  Lab 03/26/21 0926 03/27/21 0422  NA 138 137  K 3.5 3.6  CL 103 105  CO2 24 23  GLUCOSE 240* 167*  BUN 12 13  CREATININE 1.05 0.96  CALCIUM 9.3 9.0  PROT 7.1 6.5  ALBUMIN 3.9 3.4*  AST 68* 29  ALT 59* 43  ALKPHOS 66 61  BILITOT 0.7 0.8  GFRNONAA >60 >60  ANIONGAP 11 9    Lipids  Recent Labs  Lab 03/27/21 0422  CHOL 151  TRIG 76  HDL 47  LDLCALC 89  CHOLHDL 3.2    Hematology Recent Labs  Lab 03/26/21 0926 03/27/21 0422  WBC 6.1 5.0  RBC 4.79 4.47  HGB 15.4 14.4  HCT 45.3 42.3  MCV 94.6 94.6  MCH 32.2 32.2  MCHC 34.0 34.0  RDW 12.8 12.8  PLT 196 200   Thyroid No results for input(s): TSH, FREET4 in the last 168 hours.  BNPNo results for input(s): BNP, PROBNP in the last 168 hours.  DDimer  Recent Labs  Lab 03/26/21 1101  DDIMER 0.35     Radiology    DG Chest 2 View  Result Date: 03/26/2021 CLINICAL DATA:  Chest pain EXAM: CHEST - 2 VIEW COMPARISON:  07/14/2018 FINDINGS: The heart size and mediastinal contours are within normal limits. Both lungs are clear. The visualized skeletal structures are unremarkable. IMPRESSION: No active cardiopulmonary disease. Electronically Signed   By: Misty Stanley M.D.   On: 03/26/2021 09:40   CARDIAC CATHETERIZATION  Result Date: 03/27/2021   Prox RCA to Dist RCA lesion is 100% stenosed.  Faint left-to-right collaterals to the PDA system   Prox Cx lesion is 99% stenosed with 99% stenosed side branch in 1st Mrg. -> 1st Mrg lesion is 80% stenosed.   Beyond 1st Mrg: Prox Cx to Dist Cx lesion is  100% stenosed with 100% stenosed side branch in 2nd Mrg. (2nd Mrg fills via faint left-left collaterals distally)   Ramus lesion is 95% stenosed.  (Small caliber vessel)   Prox LAD to Mid LAD lesion is 55% stenosed.  Prior to 1st Diag   Mid LAD to Dist LAD lesion is 75% stenosed - beyond 1st Diag   Dist (Apical) LAD lesion is 90% stenosed with 90% stenosed side branch in 2nd Diag.   LV end diastolic pressure is normal.   There is no aortic valve stenosis.   Severely Reduced LVEF by Echocardiogram SUMMARY Severe Multivessel Occlusive CAD: Large RCA 100% proximal CTO (heavily calcified) -faint left-to-right collaterals to the PDA LCx occluded after OM1 with faint collaterals filling OM2; OM1 has bifurcation 99% stenosis followed by 80% stenosis Small caliber bifurcating Ramus Intermedius with ostial proximal 95% stenosis (likely too small for revascularization) Moderate caliber LAD with diffuse segmental eccentric calcified 55 to 60% proximal stenosis prior to major D1 and mid vessel.  Beyond D1 75% segmental stenosis, and 95% apical Normal LVEDP with severely reduced EF of 20 to 25% by echocardiogram-WITH INFERIOR WALL AKINESIS and ANTERIOR HYPOKINESIS RECOMMENDATIONS Admit to inpatient unit, restart IV heparin CVTS consultation with reduced EF and multivessel disease in a diabetic. Images reviewed with Dr. Martinique Convert from Lopressor to carvedilol, as blood pressure tolerates start initiating [place] for ISCHEMIC CARDIOMYOPATHY/CAD -> Entresto +/- Spironolactone if possible prior to d/c,, continue SGLT2-I High-dose high intensity statin, and aggressive glycemic control Glenetta Hew, MD  ECHOCARDIOGRAM COMPLETE  Result Date: 03/26/2021    ECHOCARDIOGRAM REPORT   Patient Name:   Gene Weeks Date of Exam: 03/26/2021 Medical Rec #:  [id number]     Height:       77.0 in Accession #:    [id number]    Weight:       265.0 lb Date of Birth:  1975/12/03     BSA:          2.521 m Patient Age:     2 years      BP:           106/82 mmHg Patient Gender: M             HR:           88 bpm. Exam Location:  Inpatient Procedure: 2D Echo, 3D Echo, Cardiac Doppler, Color Doppler and Intracardiac  Opacification Agent Indications:    122-I22.9 Subsequent ST elevation (STEM) and non-ST elevation                 (NSTEMI) myocardial infarction  History:        Patient has no prior history of Echocardiogram examinations.                 Signs/Symptoms:Shortness of Breath, Chest Pain and Syncope; Risk                 Factors:Hypertension and Diabetes.  Sonographer:    Roseanna Rainbow RDCS Referring Phys: [id number] [place]  1. Left ventricular ejection fraction, by estimation, is 20 to 25%. The left ventricle has severely decreased function. The left ventricle demonstrates regional wall motion abnormalities (see scoring diagram/findings for description). The left ventricular internal cavity size was mildly dilated. Left ventricular diastolic parameters are consistent with Grade I diastolic dysfunction (impaired relaxation).  2. Right ventricular systolic function is normal. The right ventricular size is normal.  3. The mitral valve is normal in structure. No evidence of mitral valve regurgitation. No evidence of mitral stenosis.  4. The aortic valve is normal in structure. Aortic valve regurgitation is not visualized. Aortic valve sclerosis is present, with no evidence of aortic valve stenosis.  5. The inferior vena cava is normal in size with greater than 50% respiratory variability, suggesting right atrial pressure of 3 mmHg. Comparison(s): No prior Echocardiogram. FINDINGS  Left Ventricle: Left ventricular ejection fraction, by estimation, is 20 to 25%. The left ventricle has severely decreased function. The left ventricle demonstrates regional wall motion abnormalities. Definity contrast agent was given IV to delineate the left ventricular endocardial borders. The left ventricular internal  cavity size was mildly dilated. There is no left ventricular hypertrophy. Left ventricular diastolic parameters are consistent with Grade I diastolic dysfunction (impaired relaxation).  LV Wall Scoring: The basal inferolateral segment, basal inferior segment, and basal inferoseptal segment are akinetic. The entire anterior wall, antero-lateral wall, mid and distal lateral wall, entire anterior septum, entire apex, mid and distal inferior wall, and mid inferoseptal segment are hypokinetic. Right Ventricle: The right ventricular size is normal. No increase in right ventricular wall thickness. Right ventricular systolic function is normal. Left Atrium: Left atrial size was normal in size. Right Atrium: Right atrial size was normal in size. Pericardium: There is no evidence of pericardial effusion. Mitral Valve: The mitral valve is normal in structure. No evidence of mitral valve regurgitation. No evidence of mitral valve stenosis. Tricuspid Valve: The tricuspid valve is normal in structure. Tricuspid valve regurgitation is not demonstrated. No evidence of tricuspid stenosis. Aortic Valve: The aortic valve is normal in structure. Aortic valve regurgitation is not visualized. Aortic valve sclerosis is present, with no evidence of aortic valve stenosis. Pulmonic Valve: The pulmonic valve was normal in structure. Pulmonic valve regurgitation is not visualized. No evidence of pulmonic stenosis. Aorta: The aortic root is normal in size and structure. Venous: The inferior vena cava is normal in size with greater than 50% respiratory variability, suggesting right atrial pressure of 3 mmHg. IAS/Shunts: No atrial level shunt detected by color flow Doppler.  LEFT VENTRICLE PLAX 2D LVIDd:         6.30 cm      Diastology LVIDs:         5.70 cm      LV e' medial:    7.20 cm/s LV PW:         0.90 cm  LV E/e' medial:  10.3 LV IVS:        1.00 cm      LV e' lateral:   8.18 cm/s LVOT diam:     2.30 cm      LV E/e' lateral: 9.1 LV  SV:         62 LV SV Index:   25 LVOT Area:     4.15 cm                              3D Volume EF: LV Volumes (MOD)            3D EF:        30 % LV vol d, MOD A2C: 243.0 ml LV EDV:       238 ml LV vol d, MOD A4C: 232.0 ml LV ESV:       166 ml LV vol s, MOD A2C: 191.0 ml LV SV:        72 ml LV vol s, MOD A4C: 180.0 ml LV SV MOD A2C:     52.0 ml LV SV MOD A4C:     232.0 ml LV SV MOD BP:      52.5 ml RIGHT VENTRICLE             IVC RV S prime:     11.20 cm/s  IVC diam: 2.30 cm TAPSE (M-mode): 1.4 cm LEFT ATRIUM             Index        RIGHT ATRIUM           Index LA diam:        4.10 cm 1.63 cm/m   RA Area:     14.50 cm LA Vol (A2C):   44.4 ml 17.61 ml/m  RA Volume:   32.60 ml  12.93 ml/m LA Vol (A4C):   67.7 ml 26.85 ml/m LA Biplane Vol: 57.6 ml 22.85 ml/m  AORTIC VALVE LVOT Vmax:   83.30 cm/s LVOT Vmean:  55.400 cm/s LVOT VTI:    0.150 m  AORTA Ao Root diam: 3.30 cm Ao Asc diam:  3.20 cm MITRAL VALVE MV Area (PHT): 4.49 cm    SHUNTS MV Decel Time: 169 msec    Systemic VTI:  0.15 m MV E velocity: 74.17 cm/s  Systemic Diam: 2.30 cm MV A velocity: 78.17 cm/s MV E/A ratio:  0.95 Kardie Tobb DO Electronically signed by Berniece Salines DO Signature Date/Time: 03/26/2021/3:29:05 PM    Final     Cardiac Studies   Echo: IMPRESSIONS     1. Left ventricular ejection fraction, by estimation, is 20 to 25%. The  left ventricle has severely decreased function. The left ventricle  demonstrates regional wall motion abnormalities (see scoring  diagram/findings for description). The left  ventricular internal cavity size was mildly dilated. Left ventricular  diastolic parameters are consistent with Grade I diastolic dysfunction  (impaired relaxation).   2. Right ventricular systolic function is normal. The right ventricular  size is normal.   3. The mitral valve is normal in structure. No evidence of mitral valve  regurgitation. No evidence of mitral stenosis.   4. The aortic valve is normal in structure. Aortic  valve regurgitation is  not visualized. Aortic valve sclerosis is present, with no evidence of  aortic valve stenosis.   5. The inferior vena cava is normal in size with greater than 50%  respiratory variability, suggesting  right atrial pressure of 3 mmHg.   Comparison(s): No prior Echocardiogram.   Cardiac cath: LEFT HEART CATH AND CORONARY ANGIOGRAPHY   Conclusion      Prox RCA to Dist RCA lesion is 100% stenosed.  Faint left-to-right collaterals to the PDA system   Prox Cx lesion is 99% stenosed with 99% stenosed side branch in 1st Mrg. -> 1st Mrg lesion is 80% stenosed.   Beyond 1st Mrg: Prox Cx to Dist Cx lesion is 100% stenosed with 100% stenosed side branch in 2nd Mrg. (2nd Mrg fills via faint left-left collaterals distally)   Ramus lesion is 95% stenosed.  (Small caliber vessel)   Prox LAD to Mid LAD lesion is 55% stenosed.  Prior to 1st Diag   Mid LAD to Dist LAD lesion is 75% stenosed - beyond 1st Diag   Dist (Apical) LAD lesion is 90% stenosed with 90% stenosed side branch in 2nd Diag.   LV end diastolic pressure is normal.   There is no aortic valve stenosis.   Severely Reduced LVEF by Echocardiogram   SUMMARY Severe Multivessel Occlusive CAD: Large RCA 100% proximal CTO (heavily calcified) -faint left-to-right collaterals to the PDA LCx occluded after OM1 with faint collaterals filling OM2; OM1 has bifurcation 99% stenosis followed by 80% stenosis Small caliber bifurcating Ramus Intermedius with ostial proximal 95% stenosis (likely too small for revascularization) Moderate caliber LAD with diffuse segmental eccentric calcified 55 to 60% proximal stenosis prior to major D1 and mid vessel.  Beyond D1 75% segmental stenosis, and 95% apical Normal LVEDP with severely reduced EF of 20 to 25% by echocardiogram-WITH INFERIOR WALL AKINESIS and ANTERIOR HYPOKINESIS   RECOMMENDATIONS Admit to inpatient unit, restart IV heparin CVTS consultation with reduced EF and multivessel  disease in a diabetic. Images reviewed with Dr. Martin[place] Convert from Lopressor to carvedilol, as blood pressure tolerates start initiating [place] for ISCHEMIC CARDIOMYOPATHY/CAD -> Entresto +/- Spironolactone if possible prior to d/c,, continue SGLT2-I High-dose high intensity statin, and aggressive glycemic control     Glenetta Hew, MD   Diagnostic Dominance: Right Intervention   Patient Profile     46 y.o. male with long history of HTN and IDDM presents with NSTEMI and near syncope  Assessment & Plan    Non-STEMI -Classic story.  No true syncope.  EKG with new inferior lateral T wave inversion.  High-sensitivity troponin 196> 263.    On ASA, beta blocker, high dose statin. IV heparin  Cardiac cath with severe 3 vessel disease. Poorly suited for PCI   Surgical consultation for consideration of CABG  2. Acute systolic CHF. EF 25% due to ischemic CM. Will stop lisinopril. Start losartan. Will assess drug cost and consider switching to [facility] prior to DC. Continue Beta blocker and [place]. Consider aldactone later.  After revascularization and optimization of medical therapy repeat Echo in 3 months.     2.  Diabetes mellitus - hemoglobin A1c 8.2%. on insulin -Sliding scale insulin while admitted   3. HTN - on beta blocker and losartan  4. HLD goal LDL < 55. Now on high dose statin.     For questions or updates, please contact [place] Please consult [email or web address] for contact info under        Signed, Keian Odriscoll [place], MD  03/27/2021, 11:36 AM

## 2021-03-28 ENCOUNTER — Other Ambulatory Visit (HOSPITAL_COMMUNITY): Payer: Self-pay

## 2021-03-28 ENCOUNTER — Inpatient Hospital Stay (HOSPITAL_COMMUNITY): Payer: BC Managed Care – PPO

## 2021-03-28 ENCOUNTER — Encounter (HOSPITAL_COMMUNITY): Payer: Self-pay | Admitting: Cardiology

## 2021-03-28 DIAGNOSIS — I255 Ischemic cardiomyopathy: Secondary | ICD-10-CM | POA: Diagnosis not present

## 2021-03-28 DIAGNOSIS — R55 Syncope and collapse: Secondary | ICD-10-CM | POA: Diagnosis not present

## 2021-03-28 DIAGNOSIS — Z01810 Encounter for preprocedural cardiovascular examination: Secondary | ICD-10-CM

## 2021-03-28 DIAGNOSIS — I214 Non-ST elevation (NSTEMI) myocardial infarction: Secondary | ICD-10-CM | POA: Diagnosis not present

## 2021-03-28 DIAGNOSIS — I5021 Acute systolic (congestive) heart failure: Secondary | ICD-10-CM | POA: Diagnosis not present

## 2021-03-28 LAB — POCT I-STAT 7, (LYTES, BLD GAS, ICA,H+H)
Acid-base deficit: 2 mmol/L (ref 0.0–2.0)
Bicarbonate: 23.5 mmol/L (ref 20.0–28.0)
Calcium, Ion: 1.28 mmol/L (ref 1.15–1.40)
HCT: 43 % (ref 39.0–52.0)
Hemoglobin: 14.6 g/dL (ref 13.0–17.0)
O2 Saturation: 97 %
Patient temperature: 98.5
Potassium: 3.8 mmol/L (ref 3.5–5.1)
Sodium: 135 mmol/L (ref 135–145)
TCO2: 25 mmol/L (ref 22–32)
pCO2 arterial: 41.6 mmHg (ref 32.0–48.0)
pH, Arterial: 7.36 (ref 7.350–7.450)
pO2, Arterial: 99 mmHg (ref 83.0–108.0)

## 2021-03-28 LAB — GLUCOSE, CAPILLARY
Glucose-Capillary: 191 mg/dL — ABNORMAL HIGH (ref 70–99)
Glucose-Capillary: 239 mg/dL — ABNORMAL HIGH (ref 70–99)
Glucose-Capillary: 153 mg/dL — ABNORMAL HIGH (ref 70–99)
Glucose-Capillary: 200 mg/dL — ABNORMAL HIGH (ref 70–99)

## 2021-03-28 LAB — BASIC METABOLIC PANEL WITH GFR
Anion gap: 6 (ref 5–15)
Anion gap: 10 (ref 5–15)
BUN: 11 mg/dL (ref 6–20)
BUN: 9 mg/dL (ref 6–20)
CO2: 23 mmol/L (ref 22–32)
CO2: 23 mmol/L (ref 22–32)
Calcium: 8.8 mg/dL — ABNORMAL LOW (ref 8.9–10.3)
Calcium: 9.1 mg/dL (ref 8.9–10.3)
Chloride: 106 mmol/L (ref 98–111)
Chloride: 104 mmol/L (ref 98–111)
Creatinine, Ser: 1.01 mg/dL (ref 0.61–1.24)
Creatinine, Ser: 0.86 mg/dL (ref 0.61–1.24)
GFR, Estimated: >60 mL/min (ref >60)
GFR, Estimated: >60 mL/min (ref >60)
Glucose, Bld: 274 mg/dL — ABNORMAL HIGH (ref 70–99)
Glucose, Bld: 200 mg/dL — ABNORMAL HIGH (ref 70–99)
Potassium: 4.2 mmol/L (ref 3.5–5.1)
Potassium: 3.9 mmol/L (ref 3.5–5.1)
Sodium: 135 mmol/L (ref 135–145)
Sodium: 137 mmol/L (ref 135–145)

## 2021-03-28 LAB — TYPE AND SCREEN
ABO/RH(D): O POS
Antibody Screen: NEGATIVE

## 2021-03-28 LAB — CBC
HCT: 45.8 % (ref 39.0–52.0)
Hemoglobin: 15.3 g/dL (ref 13.0–17.0)
MCH: 31.4 pg (ref 26.0–34.0)
MCHC: 33.4 g/dL (ref 30.0–36.0)
MCV: 93.9 fL (ref 80.0–100.0)
Platelets: 199 10*3/uL (ref 150–400)
RBC: 4.88 MIL/uL (ref 4.22–5.81)
RDW: 12.8 % (ref 11.5–15.5)
WBC: 4.4 10*3/uL (ref 4.0–10.5)
nRBC: 0 % (ref 0.0–0.2)

## 2021-03-28 LAB — ABO/RH: ABO/RH(D): O POS

## 2021-03-28 LAB — TROPONIN I (HIGH SENSITIVITY): Troponin I (High Sensitivity): 288 ng/L (ref <18)

## 2021-03-28 LAB — MAGNESIUM: Magnesium: 2.1 mg/dL (ref 1.7–2.4)

## 2021-03-28 LAB — HEPARIN LEVEL (UNFRACTIONATED): Heparin Unfractionated: 0.37 IU/mL (ref 0.30–0.70)

## 2021-03-28 MED ORDER — TRANEXAMIC ACID 1000 MG/10ML IV SOLN
1.5000 mg/kg/h | INTRAVENOUS | Status: DC
  Filled 2021-03-28: qty 25

## 2021-03-28 MED ORDER — NITROGLYCERIN IN D5W 200-5 MCG/ML-% IV SOLN
2.0000 ug/min | INTRAVENOUS | Status: DC
  Filled 2021-03-28: qty 250

## 2021-03-28 MED ORDER — POTASSIUM CHLORIDE 2 MEQ/ML IV SOLN
80.0000 meq | INTRAVENOUS | Status: DC
  Filled 2021-03-28: qty 40

## 2021-03-28 MED ORDER — METOPROLOL TARTRATE 12.5 MG HALF TABLET
12.5000 mg | ORAL_TABLET | Freq: Once | ORAL | Status: AC
  Administered 2021-03-29: 12.5 mg via ORAL
  Filled 2021-03-28: qty 1

## 2021-03-28 MED ORDER — CHLORHEXIDINE GLUCONATE 0.12 % MT SOLN
15.0000 mL | Freq: Once | OROMUCOSAL | Status: AC
  Administered 2021-03-29: 15 mL via OROMUCOSAL

## 2021-03-28 MED ORDER — HEPARIN 30,000 UNITS/1000 ML (OHS) CELLSAVER SOLUTION
Status: DC
  Filled 2021-03-28: qty 1000

## 2021-03-28 MED ORDER — CHLORHEXIDINE GLUCONATE CLOTH 2 % EX PADS
6.0000 | MEDICATED_PAD | Freq: Once | CUTANEOUS | Status: AC
  Administered 2021-03-28: 6 via TOPICAL

## 2021-03-28 MED ORDER — MILRINONE LACTATE IN DEXTROSE 20-5 MG/100ML-% IV SOLN
0.3000 ug/kg/min | INTRAVENOUS | Status: DC
  Filled 2021-03-28: qty 100

## 2021-03-28 MED ORDER — LEVOFLOXACIN IN D5W 500 MG/100ML IV SOLN
500.0000 mg | INTRAVENOUS | Status: AC
  Administered 2021-03-29: 500 mg via INTRAVENOUS
  Filled 2021-03-28: qty 100

## 2021-03-28 MED ORDER — ~~LOC~~ CARDIAC SURGERY, PATIENT & FAMILY EDUCATION: Freq: Once | Status: AC

## 2021-03-28 MED ORDER — DEXMEDETOMIDINE HCL IN NACL 400 MCG/100ML IV SOLN
0.1000 ug/kg/h | INTRAVENOUS | Status: AC
  Administered 2021-03-29: .2 ug/kg/h via INTRAVENOUS
  Filled 2021-03-28: qty 100

## 2021-03-28 MED ORDER — CHLORHEXIDINE GLUCONATE CLOTH 2 % EX PADS
6.0000 | MEDICATED_PAD | Freq: Once | CUTANEOUS | Status: AC
Start: 2021-03-29 — End: 2021-03-29
  Administered 2021-03-29: 6 via TOPICAL

## 2021-03-28 MED ORDER — NOREPINEPHRINE 4 MG/250ML-% IV SOLN
0.0000 ug/min | INTRAVENOUS | Status: DC
  Filled 2021-03-28: qty 250

## 2021-03-28 MED ORDER — TRANEXAMIC ACID (OHS) BOLUS VIA INFUSION
15.0000 mg/kg | INTRAVENOUS | Status: AC
  Administered 2021-03-29: 1336.5 mg via INTRAVENOUS
  Filled 2021-03-28: qty 1337

## 2021-03-28 MED ORDER — INSULIN REGULAR(HUMAN) IN NACL 100-0.9 UT/100ML-% IV SOLN
INTRAVENOUS | Status: AC
  Administered 2021-03-29: 1 [IU]/h via INTRAVENOUS
  Filled 2021-03-28: qty 100

## 2021-03-28 MED ORDER — EPINEPHRINE HCL 5 MG/250ML IV SOLN IN NS
0.0000 ug/min | INTRAVENOUS | Status: AC
  Administered 2021-03-29: 3 ug/min via INTRAVENOUS
  Filled 2021-03-28: qty 250

## 2021-03-28 MED ORDER — PLASMA-LYTE A IV SOLN
INTRAVENOUS | Status: DC
  Filled 2021-03-28: qty 5

## 2021-03-28 MED ORDER — MAGNESIUM SULFATE 50 % IJ SOLN
40.0000 meq | INTRAMUSCULAR | Status: DC
  Filled 2021-03-28: qty 9.85

## 2021-03-28 MED ORDER — VANCOMYCIN HCL 1500 MG/300ML IV SOLN
1500.0000 mg | INTRAVENOUS | Status: AC
  Administered 2021-03-29: 1500 mg via INTRAVENOUS
  Filled 2021-03-28: qty 300

## 2021-03-28 MED ORDER — PHENYLEPHRINE HCL-NACL 20-0.9 MG/250ML-% IV SOLN
30.0000 ug/min | INTRAVENOUS | Status: AC
  Administered 2021-03-29: 25 ug/min via INTRAVENOUS
  Filled 2021-03-28: qty 250

## 2021-03-28 MED ORDER — TRANEXAMIC ACID (OHS) PUMP PRIME SOLUTION
2.0000 mg/kg | INTRAVENOUS | Status: DC
  Filled 2021-03-28: qty 2.44

## 2021-03-28 MED ORDER — DIAZEPAM 5 MG PO TABS
5.0000 mg | ORAL_TABLET | Freq: Once | ORAL | Status: AC
  Administered 2021-03-29: 5 mg via ORAL
  Filled 2021-03-28: qty 1

## 2021-03-28 NOTE — TOC Benefit Eligibility Note (Signed)
Patient Advocate Encounter  Prior Authorization for Entresto 24-26 mg has been approved.    PA# [id number] Effective dates: 03/27/2021 through 03/27/2022  Patients co-pay is $47.00.     Lyndel Safe, [place] Patient Advocate Specialist [place] Patient Advocate Team Direct Number: [phone]  Fax: [phone]

## 2021-03-28 NOTE — Progress Notes (Signed)
Inpatient Diabetes Program Recommendations  AACE/ADA: New Consensus Statement on Inpatient Glycemic Control (2015)  Target Ranges:  Prepandial:   less than 140 mg/dL      Peak postprandial:   less than 180 mg/dL (1-2 hours)      Critically ill patients:  140 - 180 mg/dL   Lab Results  Component Value Date   GLUCAP 191 (H) 03/28/2021   HGBA1C 8.2 (H) 03/26/2021    Review of Glycemic Control  Latest Reference Range & Units 03/27/21 17:40 03/27/21 21:59 03/28/21 06:29  Glucose-Capillary 70 - 99 mg/dL 329 (H) 518 (H) 841 (H)  (H): Data is abnormally high Diabetes history: Type 2 DM Outpatient Diabetes medications: Soliqua 21 units QHS, Metformin 1000 mg BID, Novolog 8-16 units TID Current orders for Inpatient glycemic control: Semglee 15 units QHS, Novolog 0-15 units TID, Farxiga 10 mg QD  Inpatient Diabetes Program Recommendations:    Consider increasing Semglee 18 units QHS and adding Novolog 0-5 units QHS.  Thanks, Lujean Rave, MSN, RNC-OB Diabetes Coordinator [phone] (8a-5p)

## 2021-03-28 NOTE — Progress Notes (Signed)
Pre-CABG study completed.  ? ?Please see CV Proc for preliminary results.  ? ?Nary Sneed, RDMS, RVT and Jody Hill, RDMS, RVT ? ?

## 2021-03-28 NOTE — Progress Notes (Signed)
Progress Note  Patient Name: Gene Weeks Date of Encounter: 03/28/2021  [facility] Cardiologist: Tiger Spieker Martinique, MD   Subjective   Feels well, some chest soreness from CPR. No angina or SOB. No recurrent VT on monitor  Inpatient Medications    Scheduled Meds:  aspirin EC  81 mg Oral Daily   atorvastatin  80 mg Oral Daily   carvedilol  3.125 mg Oral BID WC   Chlorhexidine Gluconate Cloth  6 each Topical Daily   dapagliflozin propanediol  10 mg Oral Daily   insulin aspart  0-15 Units Subcutaneous TID WC   insulin glargine-yfgn  15 Units Subcutaneous QHS   losartan  25 mg Oral Daily   sodium chloride flush  3 mL Intravenous Q12H   sodium chloride flush  3 mL Intravenous Q12H   Continuous Infusions:  sodium chloride Stopped (03/27/21 0723)   sodium chloride     amiodarone 30 mg/hr (03/28/21 0800)   heparin 1,650 Units/hr (03/28/21 0800)   PRN Meds: sodium chloride, acetaminophen, acetaminophen, nitroGLYCERIN, ondansetron (ZOFRAN) IV, sodium chloride flush, traMADol   Vital Signs    Vitals:   03/28/21 0600 03/28/21 0700 03/28/21 0712 03/28/21 0800  BP: 116/82 116/84  119/86  Pulse: 82 78  80  Resp: 10 10  18   Temp: 98.2 F (36.8 C)  98.2 F (36.8 C)   TempSrc: Oral  Oral   SpO2: 98% 98%  98%  Weight:      Height:        Intake/Output Summary (Last 24 hours) at 03/28/2021 0825 Last data filed at 03/28/2021 0824 Gross per 24 hour  Intake 1722.65 ml  Output 1900 ml  Net -177.35 ml    Last 3 Weights 03/27/2021 03/26/2021 06/23/2020  Weight (lbs) 268 lb 15.4 oz 265 lb 293 lb 6.4 oz  Weight (kg) 122 kg 120.203 kg 133.085 kg      Telemetry    NSR - Personally Reviewed  ECG    NSR, inferior Q waves, no acute ST or T wave changes.  - Personally Reviewed  Physical Exam   GEN: No acute distress.   Neck: No JVD Cardiac: RRR, no murmurs, rubs, or gallops.  Respiratory: Clear to auscultation bilaterally. GI: Soft, nontender, non-distended  MS: No edema; No  deformity. Radial band in place.  Neuro:  Nonfocal  Psych: Normal affect   Labs    High Sensitivity Troponin:   Recent Labs  Lab 03/26/21 0926 03/26/21 1144 03/27/21 1809  TROPONINIHS 196* 263* 288*      Chemistry Recent Labs  Lab 03/26/21 0926 03/27/21 0422 03/27/21 1536 03/27/21 2100  NA 138 137 136 135  K 3.5 3.6 3.0* 4.2  CL 103 105 104 106  CO2 24 23 24 23   GLUCOSE 240* 167* 284* 274*  BUN 12 13 11 11   CREATININE 1.05 0.96 1.18 1.01  CALCIUM 9.3 9.0 8.9 8.8*  MG  --   --  1.9 2.1  PROT 7.1 6.5  --   --   ALBUMIN 3.9 3.4*  --   --   AST 68* 29  --   --   ALT 59* 43  --   --   ALKPHOS 66 61  --   --   BILITOT 0.7 0.8  --   --   GFRNONAA >60 >60 >60 >60  ANIONGAP 11 9 8 6      Lipids  Recent Labs  Lab 03/27/21 0422  CHOL 151  TRIG 76  HDL 47  LDLCALC 89  CHOLHDL 3.2     Hematology Recent Labs  Lab 03/26/21 0926 03/27/21 0422 03/27/21 1536  WBC 6.1 5.0 8.3  RBC 4.79 4.47 5.03  HGB 15.4 14.4 16.2  HCT 45.3 42.3 47.0  MCV 94.6 94.6 93.4  MCH 32.2 32.2 32.2  MCHC 34.0 34.0 34.5  RDW 12.8 12.8 12.7  PLT 196 200 201    Thyroid No results for input(s): TSH, FREET4 in the last 168 hours.  BNPNo results for input(s): BNP, PROBNP in the last 168 hours.  DDimer  Recent Labs  Lab 03/26/21 1101  DDIMER 0.35      Radiology    DG Chest 2 View  Result Date: 03/26/2021 CLINICAL DATA:  Chest pain EXAM: CHEST - 2 VIEW COMPARISON:  07/14/2018 FINDINGS: The heart size and mediastinal contours are within normal limits. Both lungs are clear. The visualized skeletal structures are unremarkable. IMPRESSION: No active cardiopulmonary disease. Electronically Signed   By: Misty Stanley M.D.   On: 03/26/2021 09:40   CARDIAC CATHETERIZATION  Result Date: 03/27/2021   Prox RCA to Dist RCA lesion is 100% stenosed.  Faint left-to-right collaterals to the PDA system   Prox Cx lesion is 99% stenosed with 99% stenosed side branch in 1st Mrg. -> 1st Mrg lesion is  80% stenosed.   Beyond 1st Mrg: Prox Cx to Dist Cx lesion is 100% stenosed with 100% stenosed side branch in 2nd Mrg. (2nd Mrg fills via faint left-left collaterals distally)   Ramus lesion is 95% stenosed.  (Small caliber vessel)   Prox LAD to Mid LAD lesion is 55% stenosed.  Prior to 1st Diag   Mid LAD to Dist LAD lesion is 75% stenosed - beyond 1st Diag   Dist (Apical) LAD lesion is 90% stenosed with 90% stenosed side branch in 2nd Diag.   LV end diastolic pressure is normal.   There is no aortic valve stenosis.   Severely Reduced LVEF by Echocardiogram SUMMARY Severe Multivessel Occlusive CAD: Large RCA 100% proximal CTO (heavily calcified) -faint left-to-right collaterals to the PDA LCx occluded after OM1 with faint collaterals filling OM2; OM1 has bifurcation 99% stenosis followed by 80% stenosis Small caliber bifurcating Ramus Intermedius with ostial proximal 95% stenosis (likely too small for revascularization) Moderate caliber LAD with diffuse segmental eccentric calcified 55 to 60% proximal stenosis prior to major D1 and mid vessel.  Beyond D1 75% segmental stenosis, and 95% apical Normal LVEDP with severely reduced EF of 20 to 25% by echocardiogram-WITH INFERIOR WALL AKINESIS and ANTERIOR HYPOKINESIS RECOMMENDATIONS Admit to inpatient unit, restart IV heparin CVTS consultation with reduced EF and multivessel disease in a diabetic. Images reviewed with Dr. Martinique Convert from Lopressor to carvedilol, as blood pressure tolerates start initiating [place] for ISCHEMIC CARDIOMYOPATHY/CAD -> Entresto +/- Spironolactone if possible prior to d/c,, continue SGLT2-I High-dose high intensity statin, and aggressive glycemic control Glenetta Hew, MD  ECHOCARDIOGRAM COMPLETE  Result Date: 03/26/2021    ECHOCARDIOGRAM REPORT   Patient Name:   Gene Weeks Date of Exam: 03/26/2021 Medical Rec #:  [id number]     Height:       77.0 in Accession #:    [id number]    Weight:       265.0 lb Date  of Birth:  Jan 27, 1976     BSA:          2.521 m Patient Age:    45 years      BP:  106/82 mmHg Patient Gender: M             HR:           88 bpm. Exam Location:  Inpatient Procedure: 2D Echo, 3D Echo, Cardiac Doppler, Color Doppler and Intracardiac            Opacification Agent Indications:    122-I22.9 Subsequent ST elevation (STEM) and non-ST elevation                 (NSTEMI) myocardial infarction  History:        Patient has no prior history of Echocardiogram examinations.                 Signs/Symptoms:Shortness of Breath, Chest Pain and Syncope; Risk                 Factors:Hypertension and Diabetes.  Sonographer:    Roseanna Rainbow RDCS Referring Phys: [id number] [place]  1. Left ventricular ejection fraction, by estimation, is 20 to 25%. The left ventricle has severely decreased function. The left ventricle demonstrates regional wall motion abnormalities (see scoring diagram/findings for description). The left ventricular internal cavity size was mildly dilated. Left ventricular diastolic parameters are consistent with Grade I diastolic dysfunction (impaired relaxation).  2. Right ventricular systolic function is normal. The right ventricular size is normal.  3. The mitral valve is normal in structure. No evidence of mitral valve regurgitation. No evidence of mitral stenosis.  4. The aortic valve is normal in structure. Aortic valve regurgitation is not visualized. Aortic valve sclerosis is present, with no evidence of aortic valve stenosis.  5. The inferior vena cava is normal in size with greater than 50% respiratory variability, suggesting right atrial pressure of 3 mmHg. Comparison(s): No prior Echocardiogram. FINDINGS  Left Ventricle: Left ventricular ejection fraction, by estimation, is 20 to 25%. The left ventricle has severely decreased function. The left ventricle demonstrates regional wall motion abnormalities. Definity contrast agent was given IV to delineate the left  ventricular endocardial borders. The left ventricular internal cavity size was mildly dilated. There is no left ventricular hypertrophy. Left ventricular diastolic parameters are consistent with Grade I diastolic dysfunction (impaired relaxation).  LV Wall Scoring: The basal inferolateral segment, basal inferior segment, and basal inferoseptal segment are akinetic. The entire anterior wall, antero-lateral wall, mid and distal lateral wall, entire anterior septum, entire apex, mid and distal inferior wall, and mid inferoseptal segment are hypokinetic. Right Ventricle: The right ventricular size is normal. No increase in right ventricular wall thickness. Right ventricular systolic function is normal. Left Atrium: Left atrial size was normal in size. Right Atrium: Right atrial size was normal in size. Pericardium: There is no evidence of pericardial effusion. Mitral Valve: The mitral valve is normal in structure. No evidence of mitral valve regurgitation. No evidence of mitral valve stenosis. Tricuspid Valve: The tricuspid valve is normal in structure. Tricuspid valve regurgitation is not demonstrated. No evidence of tricuspid stenosis. Aortic Valve: The aortic valve is normal in structure. Aortic valve regurgitation is not visualized. Aortic valve sclerosis is present, with no evidence of aortic valve stenosis. Pulmonic Valve: The pulmonic valve was normal in structure. Pulmonic valve regurgitation is not visualized. No evidence of pulmonic stenosis. Aorta: The aortic root is normal in size and structure. Venous: The inferior vena cava is normal in size with greater than 50% respiratory variability, suggesting right atrial pressure of 3 mmHg. IAS/Shunts: No atrial level shunt detected by color flow Doppler.  LEFT VENTRICLE  PLAX 2D LVIDd:         6.30 cm      Diastology LVIDs:         5.70 cm      LV e' medial:    7.20 cm/s LV PW:         0.90 cm      LV E/e' medial:  10.3 LV IVS:        1.00 cm      LV e' lateral:    8.18 cm/s LVOT diam:     2.30 cm      LV E/e' lateral: 9.1 LV SV:         62 LV SV Index:   25 LVOT Area:     4.15 cm                              3D Volume EF: LV Volumes (MOD)            3D EF:        30 % LV vol d, MOD A2C: 243.0 ml LV EDV:       238 ml LV vol d, MOD A4C: 232.0 ml LV ESV:       166 ml LV vol s, MOD A2C: 191.0 ml LV SV:        72 ml LV vol s, MOD A4C: 180.0 ml LV SV MOD A2C:     52.0 ml LV SV MOD A4C:     232.0 ml LV SV MOD BP:      52.5 ml RIGHT VENTRICLE             IVC RV S prime:     11.20 cm/s  IVC diam: 2.30 cm TAPSE (M-mode): 1.4 cm LEFT ATRIUM             Index        RIGHT ATRIUM           Index LA diam:        4.10 cm 1.63 cm/m   RA Area:     14.50 cm LA Vol (A2C):   44.4 ml 17.61 ml/m  RA Volume:   32.60 ml  12.93 ml/m LA Vol (A4C):   67.7 ml 26.85 ml/m LA Biplane Vol: 57.6 ml 22.85 ml/m  AORTIC VALVE LVOT Vmax:   83.30 cm/s LVOT Vmean:  55.400 cm/s LVOT VTI:    0.150 m  AORTA Ao Root diam: 3.30 cm Ao Asc diam:  3.20 cm MITRAL VALVE MV Area (PHT): 4.49 cm    SHUNTS MV Decel Time: 169 msec    Systemic VTI:  0.15 m MV E velocity: 74.17 cm/s  Systemic Diam: 2.30 cm MV A velocity: 78.17 cm/s MV E/A ratio:  0.95 Kardie Tobb DO Electronically signed by Berniece Salines DO Signature Date/Time: 03/26/2021/3:29:05 PM    Final     Cardiac Studies   Echo: IMPRESSIONS     1. Left ventricular ejection fraction, by estimation, is 20 to 25%. The  left ventricle has severely decreased function. The left ventricle  demonstrates regional wall motion abnormalities (see scoring  diagram/findings for description). The left  ventricular internal cavity size was mildly dilated. Left ventricular  diastolic parameters are consistent with Grade I diastolic dysfunction  (impaired relaxation).   2. Right ventricular systolic function is normal. The right ventricular  size is normal.   3. The mitral valve is normal in structure. No evidence  of mitral valve  regurgitation. No evidence of mitral  stenosis.   4. The aortic valve is normal in structure. Aortic valve regurgitation is  not visualized. Aortic valve sclerosis is present, with no evidence of  aortic valve stenosis.   5. The inferior vena cava is normal in size with greater than 50%  respiratory variability, suggesting right atrial pressure of 3 mmHg.   Comparison(s): No prior Echocardiogram.   Cardiac cath: LEFT HEART CATH AND CORONARY ANGIOGRAPHY   Conclusion      Prox RCA to Dist RCA lesion is 100% stenosed.  Faint left-to-right collaterals to the PDA system   Prox Cx lesion is 99% stenosed with 99% stenosed side branch in 1st Mrg. -> 1st Mrg lesion is 80% stenosed.   Beyond 1st Mrg: Prox Cx to Dist Cx lesion is 100% stenosed with 100% stenosed side branch in 2nd Mrg. (2nd Mrg fills via faint left-left collaterals distally)   Ramus lesion is 95% stenosed.  (Small caliber vessel)   Prox LAD to Mid LAD lesion is 55% stenosed.  Prior to 1st Diag   Mid LAD to Dist LAD lesion is 75% stenosed - beyond 1st Diag   Dist (Apical) LAD lesion is 90% stenosed with 90% stenosed side branch in 2nd Diag.   LV end diastolic pressure is normal.   There is no aortic valve stenosis.   Severely Reduced LVEF by Echocardiogram   SUMMARY Severe Multivessel Occlusive CAD: Large RCA 100% proximal CTO (heavily calcified) -faint left-to-right collaterals to the PDA LCx occluded after OM1 with faint collaterals filling OM2; OM1 has bifurcation 99% stenosis followed by 80% stenosis Small caliber bifurcating Ramus Intermedius with ostial proximal 95% stenosis (likely too small for revascularization) Moderate caliber LAD with diffuse segmental eccentric calcified 55 to 60% proximal stenosis prior to major D1 and mid vessel.  Beyond D1 75% segmental stenosis, and 95% apical Normal LVEDP with severely reduced EF of 20 to 25% by echocardiogram-WITH INFERIOR WALL AKINESIS and ANTERIOR HYPOKINESIS   RECOMMENDATIONS Admit to inpatient unit, restart  IV heparin CVTS consultation with reduced EF and multivessel disease in a diabetic. Images reviewed with Dr. Martinique Convert from Lopressor to carvedilol, as blood pressure tolerates start initiating [place] for ISCHEMIC CARDIOMYOPATHY/CAD -> Entresto +/- Spironolactone if possible prior to d/c,, continue SGLT2-I High-dose high intensity statin, and aggressive glycemic control     Glenetta Hew, MD   Diagnostic Dominance: Right Intervention   Patient Profile     46 y.o. male with long history of HTN and IDDM presents with NSTEMI and near syncope  Assessment & Plan    Non-STEMI -Classic story.    EKG with inferior Q waves. High-sensitivity troponin 196> 263> 288.  On ASA, beta blocker, high dose statin. IV heparin  Cardiac cath with severe 3 vessel disease. Poorly suited for PCI   Surgical consultation for CABG- planned tomorrow with Dr Roxan Hockey   If becomes unstable with recurrent VT/VF would need to consider support with emergent CABG.    Echo with poor EF 25%.   2. Acute systolic CHF. EF 25% due to ischemic CM. Will stop lisinopril. Start losartan. Plan  switching to [facility] post op. Continue Coreg and [place]. Consider aldactone prior to DC. Will follow closely.  After revascularization and optimization of medical therapy repeat Echo in 3 months.     3. Syncope. Had VT/VF yesterday. Now on amiodarone. Repleted potassium and magnesium. Will need Life vest on discharge with repeat assessment of EF in  2-3 months. If EF remains low may need ICD.  4.  Diabetes mellitus - hemoglobin A1c 8.2%. on insulin -Sliding scale insulin while admitted   5.  HTN - on beta blocker and losartan  6. HLD goal LDL < 55. Now on high dose statin.     For questions or updates, please contact [place] Please consult [email or web address] for contact info under        Signed, Kayia Billinger Martinique, MD  03/28/2021, 8:25 AM

## 2021-03-28 NOTE — Progress Notes (Signed)
ANTICOAGULATION CONSULT NOTE - Initial Consult  Pharmacy Consult for heparin Indication: chest pain/ACS  Allergies  Allergen Reactions   Amoxicillin Hives    Patient Measurements: Height: 6\' 5"  (195.6 cm) Weight: 122 kg (268 lb 15.4 oz) IBW/kg (Calculated) : 89.1 Heparin Dosing Weight: 113kg  Vital Signs: Temp: 98.2 F (36.8 C) (02/01 0712) Temp Source: Oral (02/01 0712) BP: 121/97 (02/01 0900) Pulse Rate: 95 (02/01 0900)  Labs: Recent Labs    03/26/21 0926 03/26/21 1144 03/26/21 2113 03/27/21 0422 03/27/21 1536 03/27/21 1809 03/27/21 2100 03/28/21 0749  HGB 15.4  --   --  14.4 16.2  --   --  15.3  HCT 45.3  --   --  42.3 47.0  --   --  45.8  PLT 196  --   --  200 201  --   --  199  HEPARINUNFRC  --   --  0.14* 0.38  --   --   --  0.37  CREATININE 1.05  --   --  0.96 1.18  --  1.01 0.86  CKTOTAL  --   --   --  301  --   --   --   --   TROPONINIHS 196* 263*  --   --   --  288*  --   --      Estimated Creatinine Clearance: 157 mL/min (by C-G formula based on SCr of 0.86 mg/dL).   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 Treated with TPA with resolution of symptoms within 20 minutes. Normal neuro evaluation and normal MRI     Assessment: 67 YOM presenting with CP and near syncope. No PTA AC.  Cath lab on 1/31 for CVTS workup. Resumed heparin 8h post sheath removal (0830). VT arrest post cath and transferred to ICU. Pharmacy consulted for IV heparin.  Heparin level: 0.37, therapeutic Current heparin rate 1650. No issues noted with infusion. Running appropriately  in room. CBC stable.   Goal of Therapy:  Heparin level 0.3-0.7 units/ml Monitor platelets by anticoagulation protocol: Yes   Plan:  Continue heparin 1650 units/h  Daily heparin level and CBC   Thank you for allowing pharmacy to participate in this patient's care.  Levonne Spiller, PharmD PGY1 Acute Care Resident  03/28/2021,10:12 AM

## 2021-03-29 ENCOUNTER — Encounter (HOSPITAL_COMMUNITY): Payer: Self-pay | Admitting: Internal Medicine

## 2021-03-29 ENCOUNTER — Inpatient Hospital Stay (HOSPITAL_COMMUNITY): Payer: BC Managed Care – PPO

## 2021-03-29 ENCOUNTER — Inpatient Hospital Stay (HOSPITAL_COMMUNITY): Payer: BC Managed Care – PPO | Admitting: Certified Registered Nurse Anesthetist

## 2021-03-29 ENCOUNTER — Other Ambulatory Visit: Payer: Self-pay

## 2021-03-29 ENCOUNTER — Inpatient Hospital Stay (HOSPITAL_COMMUNITY)
Admission: EM | Disposition: A | Discharge status: Home / Self Care | Payer: Self-pay | Source: Home / Self Care | Attending: Thoracic Surgery (Cardiothoracic Vascular Surgery)

## 2021-03-29 DIAGNOSIS — Z951 Presence of aortocoronary bypass graft: Secondary | ICD-10-CM

## 2021-03-29 DIAGNOSIS — I255 Ischemic cardiomyopathy: Secondary | ICD-10-CM | POA: Diagnosis not present

## 2021-03-29 DIAGNOSIS — I214 Non-ST elevation (NSTEMI) myocardial infarction: Secondary | ICD-10-CM | POA: Diagnosis not present

## 2021-03-29 DIAGNOSIS — I2510 Atherosclerotic heart disease of native coronary artery without angina pectoris: Secondary | ICD-10-CM

## 2021-03-29 DIAGNOSIS — R55 Syncope and collapse: Secondary | ICD-10-CM | POA: Diagnosis not present

## 2021-03-29 DIAGNOSIS — I5021 Acute systolic (congestive) heart failure: Secondary | ICD-10-CM | POA: Diagnosis not present

## 2021-03-29 DIAGNOSIS — I428 Other cardiomyopathies: Secondary | ICD-10-CM

## 2021-03-29 HISTORY — PX: ENDOVEIN HARVEST OF GREATER SAPHENOUS VEIN: SHX5059

## 2021-03-29 HISTORY — PX: RADIAL ARTERY HARVEST: SHX5067

## 2021-03-29 HISTORY — PX: TEE WITHOUT CARDIOVERSION: SHX5443

## 2021-03-29 HISTORY — PX: CORONARY ARTERY BYPASS GRAFT: SHX141

## 2021-03-29 LAB — POCT I-STAT 7, (LYTES, BLD GAS, ICA,H+H)
Acid-Base Excess: 2 mmol/L (ref 0.0–2.0)
Acid-base deficit: 2 mmol/L (ref 0.0–2.0)
Acid-base deficit: 1 mmol/L (ref 0.0–2.0)
Acid-base deficit: 1 mmol/L (ref 0.0–2.0)
Acid-base deficit: 3 mmol/L — ABNORMAL HIGH (ref 0.0–2.0)
Acid-base deficit: 5 mmol/L — ABNORMAL HIGH (ref 0.0–2.0)
Bicarbonate: 23.6 mmol/L (ref 20.0–28.0)
Bicarbonate: 24.3 mmol/L (ref 20.0–28.0)
Bicarbonate: 26.7 mmol/L (ref 20.0–28.0)
Bicarbonate: 24.7 mmol/L (ref 20.0–28.0)
Bicarbonate: 22.6 mmol/L (ref 20.0–28.0)
Bicarbonate: 20.7 mmol/L (ref 20.0–28.0)
Calcium, Ion: 1.25 mmol/L (ref 1.15–1.40)
Calcium, Ion: 1.11 mmol/L — ABNORMAL LOW (ref 1.15–1.40)
Calcium, Ion: 1.18 mmol/L (ref 1.15–1.40)
Calcium, Ion: 1.17 mmol/L (ref 1.15–1.40)
Calcium, Ion: 1.32 mmol/L (ref 1.15–1.40)
Calcium, Ion: 1.24 mmol/L (ref 1.15–1.40)
HCT: 42 % (ref 39.0–52.0)
HCT: 34 % — ABNORMAL LOW (ref 39.0–52.0)
HCT: 36 % — ABNORMAL LOW (ref 39.0–52.0)
HCT: 33 % — ABNORMAL LOW (ref 39.0–52.0)
HCT: 37 % — ABNORMAL LOW (ref 39.0–52.0)
HCT: 40 % (ref 39.0–52.0)
Hemoglobin: 14.3 g/dL (ref 13.0–17.0)
Hemoglobin: 11.6 g/dL — ABNORMAL LOW (ref 13.0–17.0)
Hemoglobin: 12.2 g/dL — ABNORMAL LOW (ref 13.0–17.0)
Hemoglobin: 11.2 g/dL — ABNORMAL LOW (ref 13.0–17.0)
Hemoglobin: 12.6 g/dL — ABNORMAL LOW (ref 13.0–17.0)
Hemoglobin: 13.6 g/dL (ref 13.0–17.0)
O2 Saturation: 100 %
O2 Saturation: 100 %
O2 Saturation: 100 %
O2 Saturation: 100 %
O2 Saturation: 99 %
O2 Saturation: 99 %
Patient temperature: 36
Patient temperature: 37.3
Potassium: 3.6 mmol/L (ref 3.5–5.1)
Potassium: 4 mmol/L (ref 3.5–5.1)
Potassium: 4 mmol/L (ref 3.5–5.1)
Potassium: 3.8 mmol/L (ref 3.5–5.1)
Potassium: 3.5 mmol/L (ref 3.5–5.1)
Potassium: 4.9 mmol/L (ref 3.5–5.1)
Sodium: 137 mmol/L (ref 135–145)
Sodium: 138 mmol/L (ref 135–145)
Sodium: 139 mmol/L (ref 135–145)
Sodium: 138 mmol/L (ref 135–145)
Sodium: 138 mmol/L (ref 135–145)
Sodium: 137 mmol/L (ref 135–145)
TCO2: 25 mmol/L (ref 22–32)
TCO2: 26 mmol/L (ref 22–32)
TCO2: 28 mmol/L (ref 22–32)
TCO2: 26 mmol/L (ref 22–32)
TCO2: 24 mmol/L (ref 22–32)
TCO2: 22 mmol/L (ref 22–32)
pCO2 arterial: 41.1 mmHg (ref 32.0–48.0)
pCO2 arterial: 43.3 mmHg (ref 32.0–48.0)
pCO2 arterial: 41.7 mmHg (ref 32.0–48.0)
pCO2 arterial: 43.8 mmHg (ref 32.0–48.0)
pCO2 arterial: 41.6 mmHg (ref 32.0–48.0)
pCO2 arterial: 41.2 mmHg (ref 32.0–48.0)
pH, Arterial: 7.368 (ref 7.350–7.450)
pH, Arterial: 7.356 (ref 7.350–7.450)
pH, Arterial: 7.414 (ref 7.350–7.450)
pH, Arterial: 7.359 (ref 7.350–7.450)
pH, Arterial: 7.34 — ABNORMAL LOW (ref 7.350–7.450)
pH, Arterial: 7.31 — ABNORMAL LOW (ref 7.350–7.450)
pO2, Arterial: 370 mmHg — ABNORMAL HIGH (ref 83.0–108.0)
pO2, Arterial: 322 mmHg — ABNORMAL HIGH (ref 83.0–108.0)
pO2, Arterial: 301 mmHg — ABNORMAL HIGH (ref 83.0–108.0)
pO2, Arterial: 276 mmHg — ABNORMAL HIGH (ref 83.0–108.0)
pO2, Arterial: 148 mmHg — ABNORMAL HIGH (ref 83.0–108.0)
pO2, Arterial: 166 mmHg — ABNORMAL HIGH (ref 83.0–108.0)

## 2021-03-29 LAB — POCT I-STAT, CHEM 8
BUN: 9 mg/dL (ref 6–20)
BUN: 8 mg/dL (ref 6–20)
BUN: 9 mg/dL (ref 6–20)
BUN: 8 mg/dL (ref 6–20)
BUN: 8 mg/dL (ref 6–20)
BUN: 7 mg/dL (ref 6–20)
Calcium, Ion: 1.19 mmol/L (ref 1.15–1.40)
Calcium, Ion: 1.17 mmol/L (ref 1.15–1.40)
Calcium, Ion: 1.21 mmol/L (ref 1.15–1.40)
Calcium, Ion: 1.17 mmol/L (ref 1.15–1.40)
Calcium, Ion: 1.18 mmol/L (ref 1.15–1.40)
Calcium, Ion: 1.13 mmol/L — ABNORMAL LOW (ref 1.15–1.40)
Chloride: 104 mmol/L (ref 98–111)
Chloride: 103 mmol/L (ref 98–111)
Chloride: 103 mmol/L (ref 98–111)
Chloride: 103 mmol/L (ref 98–111)
Chloride: 104 mmol/L (ref 98–111)
Chloride: 103 mmol/L (ref 98–111)
Creatinine, Ser: 0.7 mg/dL (ref 0.61–1.24)
Creatinine, Ser: 0.7 mg/dL (ref 0.61–1.24)
Creatinine, Ser: 0.7 mg/dL (ref 0.61–1.24)
Creatinine, Ser: 0.7 mg/dL (ref 0.61–1.24)
Creatinine, Ser: 0.7 mg/dL (ref 0.61–1.24)
Creatinine, Ser: 0.7 mg/dL (ref 0.61–1.24)
Glucose, Bld: 150 mg/dL — ABNORMAL HIGH (ref 70–99)
Glucose, Bld: 108 mg/dL — ABNORMAL HIGH (ref 70–99)
Glucose, Bld: 132 mg/dL — ABNORMAL HIGH (ref 70–99)
Glucose, Bld: 108 mg/dL — ABNORMAL HIGH (ref 70–99)
Glucose, Bld: 108 mg/dL — ABNORMAL HIGH (ref 70–99)
Glucose, Bld: 117 mg/dL — ABNORMAL HIGH (ref 70–99)
HCT: 41 % (ref 39.0–52.0)
HCT: 35 % — ABNORMAL LOW (ref 39.0–52.0)
HCT: 38 % — ABNORMAL LOW (ref 39.0–52.0)
HCT: 34 % — ABNORMAL LOW (ref 39.0–52.0)
HCT: 35 % — ABNORMAL LOW (ref 39.0–52.0)
HCT: 35 % — ABNORMAL LOW (ref 39.0–52.0)
Hemoglobin: 13.9 g/dL (ref 13.0–17.0)
Hemoglobin: 11.9 g/dL — ABNORMAL LOW (ref 13.0–17.0)
Hemoglobin: 12.9 g/dL — ABNORMAL LOW (ref 13.0–17.0)
Hemoglobin: 11.6 g/dL — ABNORMAL LOW (ref 13.0–17.0)
Hemoglobin: 11.9 g/dL — ABNORMAL LOW (ref 13.0–17.0)
Hemoglobin: 11.9 g/dL — ABNORMAL LOW (ref 13.0–17.0)
Potassium: 3.7 mmol/L (ref 3.5–5.1)
Potassium: 3.8 mmol/L (ref 3.5–5.1)
Potassium: 4 mmol/L (ref 3.5–5.1)
Potassium: 4.5 mmol/L (ref 3.5–5.1)
Potassium: 5.2 mmol/L — ABNORMAL HIGH (ref 3.5–5.1)
Potassium: 3.8 mmol/L (ref 3.5–5.1)
Sodium: 137 mmol/L (ref 135–145)
Sodium: 137 mmol/L (ref 135–145)
Sodium: 138 mmol/L (ref 135–145)
Sodium: 137 mmol/L (ref 135–145)
Sodium: 138 mmol/L (ref 135–145)
Sodium: 139 mmol/L (ref 135–145)
TCO2: 24 mmol/L (ref 22–32)
TCO2: 26 mmol/L (ref 22–32)
TCO2: 27 mmol/L (ref 22–32)
TCO2: 25 mmol/L (ref 22–32)
TCO2: 26 mmol/L (ref 22–32)
TCO2: 25 mmol/L (ref 22–32)

## 2021-03-29 LAB — POCT I-STAT EG7
Acid-base deficit: 1 mmol/L (ref 0.0–2.0)
Bicarbonate: 25.4 mmol/L (ref 20.0–28.0)
Calcium, Ion: 1.15 mmol/L (ref 1.15–1.40)
HCT: 35 % — ABNORMAL LOW (ref 39.0–52.0)
Hemoglobin: 11.9 g/dL — ABNORMAL LOW (ref 13.0–17.0)
O2 Saturation: 79 %
Potassium: 3.4 mmol/L — ABNORMAL LOW (ref 3.5–5.1)
Sodium: 135 mmol/L (ref 135–145)
TCO2: 27 mmol/L (ref 22–32)
pCO2, Ven: 49.7 mmHg (ref 44.0–60.0)
pH, Ven: 7.316 (ref 7.250–7.430)
pO2, Ven: 47 mmHg — ABNORMAL HIGH (ref 32.0–45.0)

## 2021-03-29 LAB — CBC
HCT: 44.1 % (ref 39.0–52.0)
HCT: 36.5 % — ABNORMAL LOW (ref 39.0–52.0)
Hemoglobin: 15.1 g/dL (ref 13.0–17.0)
Hemoglobin: 12.7 g/dL — ABNORMAL LOW (ref 13.0–17.0)
MCH: 32 pg (ref 26.0–34.0)
MCH: 32.6 pg (ref 26.0–34.0)
MCHC: 34.2 g/dL (ref 30.0–36.0)
MCHC: 34.8 g/dL (ref 30.0–36.0)
MCV: 93.4 fL (ref 80.0–100.0)
MCV: 93.8 fL (ref 80.0–100.0)
Platelets: 181 10*3/uL (ref 150–400)
Platelets: 132 10*3/uL — ABNORMAL LOW (ref 150–400)
RBC: 4.72 MIL/uL (ref 4.22–5.81)
RBC: 3.89 MIL/uL — ABNORMAL LOW (ref 4.22–5.81)
RDW: 12.7 % (ref 11.5–15.5)
RDW: 12.7 % (ref 11.5–15.5)
WBC: 4.9 10*3/uL (ref 4.0–10.5)
WBC: 9.3 10*3/uL (ref 4.0–10.5)
nRBC: 0 % (ref 0.0–0.2)
nRBC: 0 % (ref 0.0–0.2)

## 2021-03-29 LAB — URINALYSIS, MICROSCOPIC (REFLEX)
Bacteria, UA: NONE SEEN
Squamous Epithelial / HPF: NONE SEEN (ref 0–5)

## 2021-03-29 LAB — URINALYSIS, ROUTINE W REFLEX MICROSCOPIC
Bilirubin Urine: NEGATIVE
Glucose, UA: >=500 mg/dL — AB
Hgb urine dipstick: NEGATIVE
Ketones, ur: 15 mg/dL — AB
Leukocytes,Ua: NEGATIVE
Nitrite: NEGATIVE
Protein, ur: NEGATIVE mg/dL
Specific Gravity, Urine: 1.02 (ref 1.005–1.030)
pH: 5.5 (ref 5.0–8.0)

## 2021-03-29 LAB — GLUCOSE, CAPILLARY
Glucose-Capillary: 153 mg/dL — ABNORMAL HIGH (ref 70–99)
Glucose-Capillary: 158 mg/dL — ABNORMAL HIGH (ref 70–99)
Glucose-Capillary: 127 mg/dL — ABNORMAL HIGH (ref 70–99)
Glucose-Capillary: 113 mg/dL — ABNORMAL HIGH (ref 70–99)

## 2021-03-29 LAB — BASIC METABOLIC PANEL
Anion gap: 10 (ref 5–15)
BUN: 10 mg/dL (ref 6–20)
CO2: 23 mmol/L (ref 22–32)
Calcium: 9 mg/dL (ref 8.9–10.3)
Chloride: 104 mmol/L (ref 98–111)
Creatinine, Ser: 0.95 mg/dL (ref 0.61–1.24)
GFR, Estimated: >60 mL/min (ref >60)
Glucose, Bld: 170 mg/dL — ABNORMAL HIGH (ref 70–99)
Potassium: 3.8 mmol/L (ref 3.5–5.1)
Sodium: 137 mmol/L (ref 135–145)

## 2021-03-29 LAB — APTT
aPTT: 59 seconds — ABNORMAL HIGH (ref 24–36)
aPTT: 27 seconds (ref 24–36)

## 2021-03-29 LAB — PROTIME-INR
INR: 1 (ref 0.8–1.2)
INR: 1.3 — ABNORMAL HIGH (ref 0.8–1.2)
Prothrombin Time: 13.5 seconds (ref 11.4–15.2)
Prothrombin Time: 16.6 seconds — ABNORMAL HIGH (ref 11.4–15.2)

## 2021-03-29 LAB — HEMOGLOBIN AND HEMATOCRIT, BLOOD
HCT: 35.7 % — ABNORMAL LOW (ref 39.0–52.0)
Hemoglobin: 12.2 g/dL — ABNORMAL LOW (ref 13.0–17.0)

## 2021-03-29 LAB — PLATELET COUNT: Platelets: 156 10*3/uL (ref 150–400)

## 2021-03-29 LAB — HEPARIN LEVEL (UNFRACTIONATED): Heparin Unfractionated: 0.36 IU/mL (ref 0.30–0.70)

## 2021-03-29 SURGERY — CORONARY ARTERY BYPASS GRAFTING (CABG)
Anesthesia: General | Site: Leg Lower | Laterality: Right

## 2021-03-29 MED ORDER — LEVOFLOXACIN IN D5W 750 MG/150ML IV SOLN
750.0000 mg | INTRAVENOUS | Status: AC
  Administered 2021-03-30: 750 mg via INTRAVENOUS
  Filled 2021-03-29: qty 150

## 2021-03-29 MED ORDER — CHLORHEXIDINE GLUCONATE 0.12 % MT SOLN
OROMUCOSAL | Status: AC
  Filled 2021-03-29: qty 15

## 2021-03-29 MED ORDER — INSULIN REGULAR(HUMAN) IN NACL 100-0.9 UT/100ML-% IV SOLN: INTRAVENOUS | Status: DC

## 2021-03-29 MED ORDER — OXYCODONE HCL 5 MG PO TABS
5.0000 mg | ORAL_TABLET | ORAL | Status: DC | PRN
  Administered 2021-03-30 (×2): 10 mg via ORAL
  Administered 2021-03-30: 5 mg via ORAL
  Administered 2021-03-30 – 2021-04-01 (×13): 10 mg via ORAL
  Filled 2021-03-29 (×15): qty 2
  Filled 2021-03-29: qty 1

## 2021-03-29 MED ORDER — FENTANYL CITRATE (PF) 250 MCG/5ML IJ SOLN
INTRAMUSCULAR | Status: AC
  Filled 2021-03-29: qty 5

## 2021-03-29 MED ORDER — EPINEPHRINE HCL 5 MG/250ML IV SOLN IN NS
0.0000 ug/min | INTRAVENOUS | Status: DC
  Administered 2021-03-29: 1 ug/min via INTRAVENOUS
  Filled 2021-03-29 (×2): qty 250

## 2021-03-29 MED ORDER — HEPARIN SODIUM (PORCINE) 1000 UNIT/ML IJ SOLN
INTRAMUSCULAR | Status: AC
  Filled 2021-03-29: qty 1

## 2021-03-29 MED ORDER — MIDAZOLAM HCL 2 MG/2ML IJ SOLN: 2.0000 mg | INTRAMUSCULAR | Status: DC | PRN

## 2021-03-29 MED ORDER — SODIUM CHLORIDE 0.9 % IV SOLN: INTRAVENOUS | Status: DC | PRN

## 2021-03-29 MED ORDER — TRAMADOL HCL 50 MG PO TABS
50.0000 mg | ORAL_TABLET | ORAL | Status: DC | PRN
  Administered 2021-03-30 – 2021-03-31 (×2): 100 mg via ORAL
  Filled 2021-03-29 (×2): qty 2

## 2021-03-29 MED ORDER — LACTATED RINGERS IV SOLN: 500.0000 mL | Freq: Once | INTRAVENOUS | Status: DC | PRN

## 2021-03-29 MED ORDER — PHENYLEPHRINE 40 MCG/ML (10ML) SYRINGE FOR IV PUSH (FOR BLOOD PRESSURE SUPPORT)
PREFILLED_SYRINGE | INTRAVENOUS | Status: AC
  Filled 2021-03-29: qty 30

## 2021-03-29 MED ORDER — MIDAZOLAM HCL (PF) 5 MG/ML IJ SOLN
INTRAMUSCULAR | Status: DC | PRN
  Administered 2021-03-29 (×4): 2 mg via INTRAVENOUS

## 2021-03-29 MED ORDER — MORPHINE SULFATE (PF) 2 MG/ML IV SOLN
1.0000 mg | INTRAVENOUS | Status: DC | PRN
  Administered 2021-03-29 – 2021-03-30 (×7): 2 mg via INTRAVENOUS
  Administered 2021-03-30: 3 mg via INTRAVENOUS
  Administered 2021-03-30 – 2021-04-01 (×14): 2 mg via INTRAVENOUS
  Filled 2021-03-29 (×18): qty 1
  Filled 2021-03-29: qty 2
  Filled 2021-03-29 (×4): qty 1

## 2021-03-29 MED ORDER — METOPROLOL TARTRATE 5 MG/5ML IV SOLN: 2.5000 mg | INTRAVENOUS | Status: DC | PRN

## 2021-03-29 MED ORDER — ASPIRIN EC 325 MG PO TBEC
325.0000 mg | DELAYED_RELEASE_TABLET | Freq: Every day | ORAL | Status: DC
  Administered 2021-03-30 – 2021-04-06 (×8): 325 mg via ORAL
  Filled 2021-03-29 (×8): qty 1

## 2021-03-29 MED ORDER — PHENYLEPHRINE 40 MCG/ML (10ML) SYRINGE FOR IV PUSH (FOR BLOOD PRESSURE SUPPORT)
PREFILLED_SYRINGE | INTRAVENOUS | Status: DC | PRN
Start: 2021-03-29 — End: 2021-03-29
  Administered 2021-03-29: 80 ug via INTRAVENOUS

## 2021-03-29 MED ORDER — NITROGLYCERIN IN D5W 200-5 MCG/ML-% IV SOLN: 7.0000 ug/min | INTRAVENOUS | Status: AC

## 2021-03-29 MED ORDER — BISACODYL 10 MG RE SUPP
10.0000 mg | Freq: Every day | RECTAL | Status: DC
  Administered 2021-04-03 – 2021-04-04 (×2): 10 mg via RECTAL
  Filled 2021-03-29: qty 1

## 2021-03-29 MED ORDER — BISACODYL 5 MG PO TBEC
10.0000 mg | DELAYED_RELEASE_TABLET | Freq: Every day | ORAL | Status: DC
  Administered 2021-03-30 – 2021-04-04 (×5): 10 mg via ORAL
  Filled 2021-03-29 (×7): qty 2

## 2021-03-29 MED ORDER — FENTANYL CITRATE (PF) 250 MCG/5ML IJ SOLN
INTRAMUSCULAR | Status: DC | PRN
  Administered 2021-03-29 (×2): 150 ug via INTRAVENOUS
  Administered 2021-03-29: 250 ug via INTRAVENOUS
  Administered 2021-03-29 (×2): 100 ug via INTRAVENOUS
  Administered 2021-03-29: 500 ug via INTRAVENOUS
  Administered 2021-03-29: 100 ug via INTRAVENOUS
  Administered 2021-03-29: 150 ug via INTRAVENOUS

## 2021-03-29 MED ORDER — VANCOMYCIN HCL IN DEXTROSE 1-5 GM/200ML-% IV SOLN
1000.0000 mg | Freq: Once | INTRAVENOUS | Status: AC
Start: 2021-03-30 — End: 2021-03-30
  Administered 2021-03-29: 1000 mg via INTRAVENOUS
  Filled 2021-03-29: qty 200

## 2021-03-29 MED ORDER — ONDANSETRON HCL 4 MG/2ML IJ SOLN
4.0000 mg | Freq: Four times a day (QID) | INTRAMUSCULAR | Status: DC | PRN
  Administered 2021-03-30 – 2021-04-07 (×5): 4 mg via INTRAVENOUS
  Filled 2021-03-29 (×5): qty 2

## 2021-03-29 MED ORDER — AMIODARONE HCL IN DEXTROSE 360-4.14 MG/200ML-% IV SOLN: INTRAVENOUS | Status: DC | PRN

## 2021-03-29 MED ORDER — PROPOFOL 10 MG/ML IV BOLUS
INTRAVENOUS | Status: DC | PRN
  Administered 2021-03-29: 50 mg via INTRAVENOUS

## 2021-03-29 MED ORDER — PANTOPRAZOLE SODIUM 40 MG PO TBEC
40.0000 mg | DELAYED_RELEASE_TABLET | Freq: Every day | ORAL | Status: DC
  Administered 2021-03-31 – 2021-04-07 (×8): 40 mg via ORAL
  Filled 2021-03-29 (×8): qty 1

## 2021-03-29 MED ORDER — SODIUM CHLORIDE 0.9% FLUSH
3.0000 mL | INTRAVENOUS | Status: DC | PRN
  Administered 2021-04-02: 3 mL via INTRAVENOUS

## 2021-03-29 MED ORDER — ACETAMINOPHEN 160 MG/5ML PO SOLN: 1000.0000 mg | Freq: Four times a day (QID) | ORAL | Status: AC

## 2021-03-29 MED ORDER — ACETAMINOPHEN 160 MG/5ML PO SOLN: 650.0000 mg | Freq: Once | ORAL | Status: AC

## 2021-03-29 MED ORDER — DEXTROSE 50 % IV SOLN: 0.0000 mL | INTRAVENOUS | Status: DC | PRN

## 2021-03-29 MED ORDER — PROTAMINE SULFATE 10 MG/ML IV SOLN
INTRAVENOUS | Status: DC | PRN
Start: 2021-03-29 — End: 2021-03-29
  Administered 2021-03-29 (×2): 75 mg via INTRAVENOUS
  Administered 2021-03-29: 20 mg via INTRAVENOUS
  Administered 2021-03-29: 50 mg via INTRAVENOUS
  Administered 2021-03-29: 20 mg via INTRAVENOUS
  Administered 2021-03-29: 50 mg via INTRAVENOUS
  Administered 2021-03-29: 30 mg via INTRAVENOUS
  Administered 2021-03-29 (×2): 50 mg via INTRAVENOUS

## 2021-03-29 MED ORDER — MAGNESIUM SULFATE 4 GM/100ML IV SOLN
4.0000 g | Freq: Once | INTRAVENOUS | Status: AC
  Administered 2021-03-29: 4 g via INTRAVENOUS
  Filled 2021-03-29: qty 100

## 2021-03-29 MED ORDER — CHLORHEXIDINE GLUCONATE 0.12 % MT SOLN
15.0000 mL | OROMUCOSAL | Status: AC
  Administered 2021-03-29: 15 mL via OROMUCOSAL

## 2021-03-29 MED ORDER — SODIUM CHLORIDE 0.9% FLUSH
3.0000 mL | Freq: Two times a day (BID) | INTRAVENOUS | Status: DC
  Administered 2021-03-30 – 2021-04-03 (×9): 3 mL via INTRAVENOUS

## 2021-03-29 MED ORDER — PHENYLEPHRINE HCL-NACL 20-0.9 MG/250ML-% IV SOLN
0.0000 ug/min | INTRAVENOUS | Status: DC
  Administered 2021-03-31: 20 ug/min via INTRAVENOUS
  Filled 2021-03-29 (×3): qty 250

## 2021-03-29 MED ORDER — ACETAMINOPHEN 650 MG RE SUPP
650.0000 mg | Freq: Once | RECTAL | Status: AC
  Administered 2021-03-29: 650 mg via RECTAL

## 2021-03-29 MED ORDER — HEMOSTATIC AGENTS (NO CHARGE) OPTIME
TOPICAL | Status: DC | PRN
  Administered 2021-03-29: 1 via TOPICAL

## 2021-03-29 MED ORDER — LACTATED RINGERS IV SOLN: INTRAVENOUS | Status: DC | PRN

## 2021-03-29 MED ORDER — 0.9 % SODIUM CHLORIDE (POUR BTL) OPTIME
TOPICAL | Status: DC | PRN
  Administered 2021-03-29: 7000 mL

## 2021-03-29 MED ORDER — ROCURONIUM BROMIDE 10 MG/ML (PF) SYRINGE
PREFILLED_SYRINGE | INTRAVENOUS | Status: AC
  Filled 2021-03-29: qty 30

## 2021-03-29 MED ORDER — HEPARIN SODIUM (PORCINE) 1000 UNIT/ML IJ SOLN
INTRAMUSCULAR | Status: AC
  Filled 2021-03-29: qty 20

## 2021-03-29 MED ORDER — SODIUM CHLORIDE (PF) 0.9 % IJ SOLN
OROMUCOSAL | Status: DC | PRN
  Administered 2021-03-29 (×3): 4 mL via TOPICAL

## 2021-03-29 MED ORDER — METOPROLOL TARTRATE 25 MG/10 ML ORAL SUSPENSION
12.5000 mg | Freq: Two times a day (BID) | ORAL | Status: DC
  Administered 2021-03-29: 12.5 mg
  Filled 2021-03-29: qty 5

## 2021-03-29 MED ORDER — ORAL CARE MOUTH RINSE: 15.0000 mL | Freq: Once | OROMUCOSAL | Status: DC

## 2021-03-29 MED ORDER — SODIUM CHLORIDE 0.9 % IV SOLN: 250.0000 mL | INTRAVENOUS | Status: DC

## 2021-03-29 MED ORDER — HEPARIN SODIUM (PORCINE) 1000 UNIT/ML IJ SOLN
INTRAMUSCULAR | Status: DC | PRN
  Administered 2021-03-29: 2000 [IU] via INTRAVENOUS
  Administered 2021-03-29: 41000 [IU] via INTRAVENOUS

## 2021-03-29 MED ORDER — EPHEDRINE 5 MG/ML INJ
INTRAVENOUS | Status: AC
  Filled 2021-03-29: qty 5

## 2021-03-29 MED ORDER — ROCURONIUM BROMIDE 10 MG/ML (PF) SYRINGE
PREFILLED_SYRINGE | INTRAVENOUS | Status: DC | PRN
Start: 2021-03-29 — End: 2021-03-29
  Administered 2021-03-29: 100 mg via INTRAVENOUS
  Administered 2021-03-29 (×3): 50 mg via INTRAVENOUS
  Administered 2021-03-29: 25 mg via INTRAVENOUS

## 2021-03-29 MED ORDER — ASPIRIN 81 MG PO CHEW
324.0000 mg | CHEWABLE_TABLET | Freq: Every day | ORAL | Status: DC
  Filled 2021-03-29: qty 4

## 2021-03-29 MED ORDER — PROPOFOL 10 MG/ML IV BOLUS
INTRAVENOUS | Status: AC
  Filled 2021-03-29: qty 20

## 2021-03-29 MED ORDER — CHLORHEXIDINE GLUCONATE 0.12 % MT SOLN: 15.0000 mL | Freq: Once | OROMUCOSAL | Status: DC

## 2021-03-29 MED ORDER — METOPROLOL TARTRATE 12.5 MG HALF TABLET
12.5000 mg | ORAL_TABLET | Freq: Two times a day (BID) | ORAL | Status: DC
  Filled 2021-03-29: qty 1

## 2021-03-29 MED ORDER — LACTATED RINGERS IV SOLN: INTRAVENOUS | Status: DC

## 2021-03-29 MED ORDER — FAMOTIDINE IN NACL 20-0.9 MG/50ML-% IV SOLN
20.0000 mg | Freq: Two times a day (BID) | INTRAVENOUS | Status: DC
  Administered 2021-03-29: 20 mg via INTRAVENOUS
  Filled 2021-03-29: qty 50

## 2021-03-29 MED ORDER — SODIUM CHLORIDE 0.45 % IV SOLN: INTRAVENOUS | Status: DC | PRN

## 2021-03-29 MED ORDER — MIDAZOLAM HCL (PF) 10 MG/2ML IJ SOLN
INTRAMUSCULAR | Status: AC
  Filled 2021-03-29: qty 2

## 2021-03-29 MED ORDER — MILRINONE LACTATE IN DEXTROSE 20-5 MG/100ML-% IV SOLN
INTRAVENOUS | Status: DC | PRN
Start: 2021-03-29 — End: 2021-03-29
  Administered 2021-03-29: .375 ug/kg/min via INTRAVENOUS

## 2021-03-29 MED ORDER — MILRINONE LACTATE IN DEXTROSE 20-5 MG/100ML-% IV SOLN
0.1250 ug/kg/min | INTRAVENOUS | Status: DC
  Administered 2021-03-30: 0.125 ug/kg/min via INTRAVENOUS
  Administered 2021-03-30: 0.375 ug/kg/min via INTRAVENOUS
  Administered 2021-03-31: 0.125 ug/kg/min via INTRAVENOUS
  Filled 2021-03-29 (×3): qty 100

## 2021-03-29 MED ORDER — PLASMA-LYTE A IV SOLN: INTRAVENOUS | Status: DC | PRN

## 2021-03-29 MED ORDER — PROTAMINE SULFATE 10 MG/ML IV SOLN
INTRAVENOUS | Status: AC
  Filled 2021-03-29: qty 25

## 2021-03-29 MED ORDER — SODIUM CHLORIDE 0.9 % IV SOLN: INTRAVENOUS | Status: DC

## 2021-03-29 MED ORDER — DEXMEDETOMIDINE HCL IN NACL 400 MCG/100ML IV SOLN
0.0000 ug/kg/h | INTRAVENOUS | Status: DC
  Administered 2021-03-29: 0.6 ug/kg/h via INTRAVENOUS
  Filled 2021-03-29: qty 100

## 2021-03-29 MED ORDER — NITROGLYCERIN 0.2 MG/ML ON CALL CATH LAB
INTRAVENOUS | Status: DC | PRN
Start: 2021-03-29 — End: 2021-03-29
  Administered 2021-03-29: 80 ug via INTRAVENOUS

## 2021-03-29 MED ORDER — CHLORHEXIDINE GLUCONATE CLOTH 2 % EX PADS
6.0000 | MEDICATED_PAD | Freq: Every day | CUTANEOUS | Status: DC
  Administered 2021-03-29 – 2021-04-06 (×7): 6 via TOPICAL

## 2021-03-29 MED ORDER — ALBUMIN HUMAN 5 % IV SOLN
250.0000 mL | INTRAVENOUS | Status: AC | PRN
  Administered 2021-03-29: 12.5 g via INTRAVENOUS

## 2021-03-29 MED ORDER — ISOSORBIDE MONONITRATE ER 30 MG PO TB24: 15.0000 mg | ORAL_TABLET | Freq: Every day | ORAL | Status: DC

## 2021-03-29 MED ORDER — DOCUSATE SODIUM 100 MG PO CAPS
200.0000 mg | ORAL_CAPSULE | Freq: Every day | ORAL | Status: DC
  Administered 2021-03-30 – 2021-04-04 (×6): 200 mg via ORAL
  Filled 2021-03-29 (×8): qty 2

## 2021-03-29 MED ORDER — POTASSIUM CHLORIDE 10 MEQ/50ML IV SOLN
10.0000 meq | INTRAVENOUS | Status: AC
  Administered 2021-03-29 (×3): 10 meq via INTRAVENOUS

## 2021-03-29 MED ORDER — ACETAMINOPHEN 500 MG PO TABS
1000.0000 mg | ORAL_TABLET | Freq: Four times a day (QID) | ORAL | Status: AC
  Administered 2021-03-30 – 2021-04-03 (×20): 1000 mg via ORAL
  Filled 2021-03-29 (×19): qty 2

## 2021-03-29 MED ORDER — PROTAMINE SULFATE 10 MG/ML IV SOLN
INTRAVENOUS | Status: AC
  Filled 2021-03-29: qty 10

## 2021-03-29 SURGICAL SUPPLY — 85 items
BAG DECANTER FOR FLEXI CONT (MISCELLANEOUS) ×5 IMPLANT
BLADE CLIPPER SURG (BLADE) ×5 IMPLANT
BLADE STERNUM SYSTEM 6 (BLADE) ×5 IMPLANT
BLADE SURG 11 STRL SS (BLADE) ×1 IMPLANT
BLADE SURG 15 STRL LF DISP TIS (BLADE) IMPLANT
BLADE SURG 15 STRL SS (BLADE) ×5
BNDG ELASTIC 4X5.8 VLCR STR LF (GAUZE/BANDAGES/DRESSINGS) ×6 IMPLANT
BNDG ELASTIC 6X5.8 VLCR STR LF (GAUZE/BANDAGES/DRESSINGS) ×5 IMPLANT
BNDG GAUZE ELAST 4 BULKY (GAUZE/BANDAGES/DRESSINGS) ×6 IMPLANT
CANISTER SUCT 3000ML PPV (MISCELLANEOUS) ×5 IMPLANT
CANNULA EZ GLIDE 8.0 24FR (CANNULA) ×1 IMPLANT
CATH CPB KIT HENDRICKSON (MISCELLANEOUS) ×5 IMPLANT
CATH ROBINSON RED A/P 18FR (CATHETERS) ×5 IMPLANT
CATH THORACIC 36FR (CATHETERS) ×5 IMPLANT
CATH THORACIC 36FR RT ANG (CATHETERS) ×5 IMPLANT
CLIP FOGARTY SPRING 6M (CLIP) ×1 IMPLANT
CLIP TI WIDE RED SMALL 24 (CLIP) ×2 IMPLANT
CONTAINER PROTECT SURGISLUSH (MISCELLANEOUS) ×10 IMPLANT
CUFF TOURN SGL QUICK 24 (TOURNIQUET CUFF)
CUFF TRNQT CYL 24X4X16.5-23 (TOURNIQUET CUFF) IMPLANT
DERMABOND ADVANCED (GAUZE/BANDAGES/DRESSINGS) ×2
DERMABOND ADVANCED .7 DNX12 (GAUZE/BANDAGES/DRESSINGS) IMPLANT
DRAPE CARDIOVASCULAR INCISE (DRAPES) ×5
DRAPE EXTREMITY T 121X128X90 (DISPOSABLE) ×5 IMPLANT
DRAPE HALF SHEET 40X57 (DRAPES) ×1 IMPLANT
DRAPE SRG 135X102X78XABS (DRAPES) ×4 IMPLANT
DRAPE WARM FLUID 44X44 (DRAPES) ×5 IMPLANT
DRSG COVADERM 4X14 (GAUZE/BANDAGES/DRESSINGS) ×5 IMPLANT
ELECT REM PT RETURN 9FT ADLT (ELECTROSURGICAL) ×10
ELECTRODE REM PT RTRN 9FT ADLT (ELECTROSURGICAL) ×8 IMPLANT
FELT TEFLON 1X6 (MISCELLANEOUS) ×10 IMPLANT
GAUZE 4X4 16PLY ~~LOC~~+RFID DBL (SPONGE) ×5 IMPLANT
GAUZE SPONGE 4X4 12PLY STRL (GAUZE/BANDAGES/DRESSINGS) ×11 IMPLANT
GEL ULTRASOUND 20GR AQUASONIC (MISCELLANEOUS) ×1 IMPLANT
GLOVE SURG SIGNA 7.5 PF LTX (GLOVE) ×15 IMPLANT
GOWN STRL REUS W/ TWL LRG LVL3 (GOWN DISPOSABLE) ×16 IMPLANT
GOWN STRL REUS W/ TWL XL LVL3 (GOWN DISPOSABLE) ×8 IMPLANT
GOWN STRL REUS W/TWL LRG LVL3 (GOWN DISPOSABLE) ×20
GOWN STRL REUS W/TWL XL LVL3 (GOWN DISPOSABLE) ×15
HEMOSTAT POWDER SURGIFOAM 1G (HEMOSTASIS) ×15 IMPLANT
HEMOSTAT SURGICEL 2X14 (HEMOSTASIS) ×5 IMPLANT
KIT BASIN OR (CUSTOM PROCEDURE TRAY) ×5 IMPLANT
KIT SUCTION CATH 14FR (SUCTIONS) ×10 IMPLANT
KIT TURNOVER KIT B (KITS) ×5 IMPLANT
KIT VASOVIEW HEMOPRO 2 VH 4000 (KITS) ×5 IMPLANT
MARKER GRAFT CORONARY BYPASS (MISCELLANEOUS) ×15 IMPLANT
MARKER SKIN DUAL TIP RULER LAB (MISCELLANEOUS) ×1 IMPLANT
NS IRRIG 1000ML POUR BTL (IV SOLUTION) ×27 IMPLANT
PACK E OPEN HEART (SUTURE) ×5 IMPLANT
PACK OPEN HEART (CUSTOM PROCEDURE TRAY) ×5 IMPLANT
PAD ARMBOARD 7.5X6 YLW CONV (MISCELLANEOUS) ×10 IMPLANT
PAD ELECT DEFIB RADIOL ZOLL (MISCELLANEOUS) ×5 IMPLANT
PENCIL BUTTON HOLSTER BLD 10FT (ELECTRODE) ×5 IMPLANT
POSITIONER HEAD DONUT 9IN (MISCELLANEOUS) ×5 IMPLANT
PUNCH AORTIC ROTATE 4.5MM 8IN (MISCELLANEOUS) ×1 IMPLANT
SET MPS 3-ND DEL (MISCELLANEOUS) ×1 IMPLANT
SHEARS HARMONIC 9CM CVD (BLADE) ×5 IMPLANT
SPONGE T-LAP 18X18 ~~LOC~~+RFID (SPONGE) ×20 IMPLANT
SPONGE T-LAP 4X18 ~~LOC~~+RFID (SPONGE) ×6 IMPLANT
SUPPORT HEART JANKE-BARRON (MISCELLANEOUS) ×5 IMPLANT
SUT BONE WAX W31G (SUTURE) ×5 IMPLANT
SUT MNCRL AB 4-0 PS2 18 (SUTURE) ×2 IMPLANT
SUT PROLENE 3 0 SH DA (SUTURE) ×8 IMPLANT
SUT PROLENE 4 0 RB 1 (SUTURE) ×15
SUT PROLENE 4-0 RB1 .5 CRCL 36 (SUTURE) IMPLANT
SUT PROLENE 6 0 C 1 30 (SUTURE) ×10 IMPLANT
SUT PROLENE 7 0 BV 1 (SUTURE) ×7 IMPLANT
SUT PROLENE 7 0 BV1 MDA (SUTURE) ×6 IMPLANT
SUT PROLENE 8 0 BV175 6 (SUTURE) ×4 IMPLANT
SUT STEEL 6MS V (SUTURE) ×5 IMPLANT
SUT STEEL SZ 6 DBL 3X14 BALL (SUTURE) ×5 IMPLANT
SUT TEM PAC WIRE 2 0 SH (SUTURE) ×4 IMPLANT
SUT VIC AB 1 CTX 36 (SUTURE) ×15
SUT VIC AB 1 CTX36XBRD ANBCTR (SUTURE) ×8 IMPLANT
SUT VIC AB 2-0 CT1 27 (SUTURE) ×10
SUT VIC AB 2-0 CT1 TAPERPNT 27 (SUTURE) IMPLANT
SYSTEM SAHARA CHEST DRAIN ATS (WOUND CARE) ×5 IMPLANT
TAPE CLOTH SURG 4X10 WHT LF (GAUZE/BANDAGES/DRESSINGS) ×2 IMPLANT
TAPE PAPER 2X10 WHT MICROPORE (GAUZE/BANDAGES/DRESSINGS) ×1 IMPLANT
TOWEL GREEN STERILE (TOWEL DISPOSABLE) ×5 IMPLANT
TOWEL GREEN STERILE FF (TOWEL DISPOSABLE) ×5 IMPLANT
TRAY FOLEY SLVR 16FR TEMP STAT (SET/KITS/TRAYS/PACK) ×5 IMPLANT
TUBING LAP HI FLOW INSUFFLATIO (TUBING) ×5 IMPLANT
UNDERPAD 30X36 HEAVY ABSORB (UNDERPADS AND DIAPERS) ×5 IMPLANT
WATER STERILE IRR 1000ML POUR (IV SOLUTION) ×10 IMPLANT

## 2021-03-29 NOTE — Anesthesia Procedure Notes (Signed)
Central Venous Catheter Insertion Performed by: Oleta Mouse, MD, anesthesiologist Start/End2/03/2021 12:15 PM, 03/29/2021 12:25 PM Patient location: OR. Preanesthetic checklist: patient identified, IV checked, site marked, risks and benefits discussed, surgical consent, monitors and equipment checked, pre-op evaluation, timeout performed and anesthesia consent Position: supine Hand hygiene performed  and maximum sterile barriers used  Catheter size: 9 Fr MAC introducer Procedure performed using ultrasound guided technique. Ultrasound Notes:anatomy identified, needle tip was noted to be adjacent to the nerve/plexus identified, no ultrasound evidence of intravascular and/or intraneural injection and image(s) printed for medical record Attempts: 1 Following insertion, line sutured, dressing applied and Biopatch. Post procedure assessment: blood return through all ports, free fluid flow and no air  Patient tolerated the procedure well with no immediate complications.

## 2021-03-29 NOTE — Progress Notes (Signed)
° °   °  [address]       [place] [address]             [phone]      S/p CABG x 5  Intubated, sedated BP (!) 104/37    Pulse 87    Temp 99.2 F (37.3 C) (Oral)    Resp 12    Ht 6\' 5"  (1.956 m)    Wt 120.6 kg    SpO2 100%    BMI 31.53 kg/m  On milrinone 0.25, epi @ 3 CI= 2.06  Intake/Output Summary (Last 24 hours) at 03/29/2021 1904 Last data filed at 03/29/2021 1855 Gross per 24 hour  Intake 3535.83 ml  Output 1250 ml  Net 2285.83 ml  Minimal CT output  Doing well early postop  Remo Lipps C. Roxan Hockey, MD [facility] [phone]

## 2021-03-29 NOTE — Anesthesia Procedure Notes (Addendum)
Arterial Line Insertion Start/End2/03/2021 12:14 PM, 03/29/2021 12:19 PM Performed by: Val Eagle, MD  Patient location: OR. Preanesthetic checklist: patient identified, IV checked, site marked, risks and benefits discussed, surgical consent, monitors and equipment checked, pre-op evaluation, timeout performed and anesthesia consent Right, radial was placed Catheter size: 20 G Hand hygiene performed  and maximum sterile barriers used   Attempts: 1 Procedure performed without using ultrasound guided technique. Following insertion, dressing applied and Biopatch. Post procedure assessment: normal and unchanged  Post procedure complications: second provider assisted. Patient tolerated the procedure well with no immediate complications.

## 2021-03-29 NOTE — Transfer of Care (Signed)
Immediate Anesthesia Transfer of Care Note  Patient: Gene Weeks  Procedure(s) Performed: CORONARY ARTERY BYPASS GRAFTING (CABG) TIMES FIVE, USING LEFT INTERNAL MAMMARY ARTERY, LEFT RADIAL ARTERY, AND ENDOSCOPICALLY HARVESTED RIGHT GREATER SAPHENOUS VEIN (Chest) RADIAL ARTERY HARVEST (Left: Arm Lower) TRANSESOPHAGEAL ECHOCARDIOGRAM (TEE) ENDOVEIN HARVEST OF GREATER SAPHENOUS VEIN (Right: Leg Lower) 22 Patient Location: ICU  Anesthesia Type:General  Level of Consciousness: sedated and Patient remains intubated per anesthesia plan  Airway & Oxygen Therapy: Patient remains intubated per anesthesia plan and Patient placed on Ventilator (see vital sign flow sheet for setting)  Post-op Assessment: Report given to RN and Post -op Vital signs reviewed and stable  Post vital signs: Reviewed and stable  Last Vitals:  Vitals Value Taken Time  BP 104/37 03/29/21 1844  Temp    Pulse 88 03/29/21 1851  Resp 13 03/29/21 1851  SpO2 100 % 03/29/21 1851  Vitals shown include unvalidated device data.  Last Pain:  Vitals:   03/29/21 1114  TempSrc: Oral  PainSc: 0-No pain      Patients Stated Pain Goal: 0 ([id number])  Complications: No notable events documented.

## 2021-03-29 NOTE — Procedures (Signed)
Extubation Procedure Note  Patient Details:   Name: Gene Weeks DOB: Jun 26, 1975 MRN: [id number]   Airway Documentation:  Airway 8.5 mm (Active)  Secured at (cm) 23 cm 03/29/21 2205  Measured From Lips 03/29/21 2205  Secured Location Right 03/29/21 2205  Secured By Pink Tape 03/29/21 2013  Cuff Pressure (cm H2O) Clear OR 27-39 CmH2O 03/29/21 2013  Site Condition Dry 03/29/21 2013   Vent end date: 03/29/21 Vent end time: 2250   Evaluation  O2 sats: stable throughout Complications: No apparent complications Patient did tolerate procedure well. Bilateral Breath Sounds: Clear   Yes  Leafy Half 03/29/2021, 10:54 PM

## 2021-03-29 NOTE — Brief Op Note (Addendum)
03/26/2021 - 03/29/2021  2:18 PM  PATIENT:  Gene Weeks  46 y.o. male  PRE-OPERATIVE DIAGNOSIS:  1. S/p NSTEMI 2. CORONARY ARTERY DISEASE  POST-OPERATIVE DIAGNOSIS:  1. S/p NSTEMI 2. CORONARY ARTERY DISEASE  PROCEDURE: TRANSESOPHAGEAL ECHOCARDIOGRAM (TEE), CORONARY ARTERY BYPASS GRAFTING (CABG) TIMES 5 (LIMA to LAD, SVG to DIAGONAL, SVG SEQUENTIALLY to OM and RAMUS INTERMEDIATE, and LEFT RADIAL ARTERY to PDA) USING LEFT INTERNAL MAMMARY ARTERY, LEFT RADIAL ARTERY, AND ENDOSCOPICALLY HARVESTED RIGHT GREATER SAPHENOUS VEIN   LEFT RADIAL ARTERY HARVEST TIME: 35 minutes LEFT RADIAL ARTERY PREP TIME: 5 minutes RIGHT EVH HARVEST TIME: 30 minutes; RIGHT EVH PREP TIME: 14 minutes  SURGEON:  Surgeon(s) and Role:    Melrose Nakayama, MD - Primary  PHYSICIAN ASSISTANTS: 1. Enid Cutter PA-C 2. Donielle Zimmerman PA-C  ANESTHESIA:   general  EBL:  Per anesthesia, perfusion record  DRAINS:  Chest tubes placed in the mediastinal and pleural spaces    COUNTS CORRECT:  YES  DICTATION: .Dragon Dictation  PLAN OF CARE: Admit to inpatient   PATIENT DISPOSITION:  ICU - intubated and hemodynamically stable.   Delay start of Pharmacological VTE agent (>24hrs) due to surgical blood loss or risk of bleeding: yes  BASELINE WEIGHT: 120.6 kg  XC= 103 min CPB= 139 min Off pump on milrinone 0.25 mcg/kg/min and epi 3 mcg/min
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
## 2021-03-29 NOTE — Progress Notes (Signed)
Progress Note  Patient Name: Gene Weeks Date of Encounter: 03/29/2021  [facility] Cardiologist: Mayetta Castleman [place], MD   Subjective   Feels well, awaiting surgery today  Inpatient Medications    Scheduled Meds:  aspirin EC  81 mg Oral Daily   atorvastatin  80 mg Oral Daily   carvedilol  3.125 mg Oral BID WC   Chlorhexidine Gluconate Cloth  6 each Topical Daily   dapagliflozin propanediol  10 mg Oral Daily   diazepam  5 mg Oral Once   epinephrine  0-10 mcg/min Intravenous To OR   heparin-papaverine-plasmalyte irrigation   Irrigation To OR   insulin aspart  0-15 Units Subcutaneous TID WC   insulin glargine-yfgn  15 Units Subcutaneous QHS   insulin   Intravenous To OR   losartan  25 mg Oral Daily   magnesium sulfate  40 mEq Other To OR   metoprolol tartrate  12.5 mg Oral Once   phenylephrine  30-200 mcg/min Intravenous To OR   potassium chloride  80 mEq Other To OR   sodium chloride flush  3 mL Intravenous Q12H   sodium chloride flush  3 mL Intravenous Q12H   tranexamic acid  15 mg/kg (Ideal) Intravenous To OR   tranexamic acid  2 mg/kg Intracatheter To OR   Continuous Infusions:  sodium chloride Stopped (03/27/21 0723)   sodium chloride     amiodarone 30 mg/hr (03/29/21 0800)   dexmedetomidine     heparin 30,000 units/NS 1000 mL solution for CELLSAVER     heparin 1,650 Units/hr (03/29/21 0800)   levofloxacin (LEVAQUIN) IV     milrinone     nitroGLYCERIN     norepinephrine     tranexamic acid (CYKLOKAPRON) infusion ([facility])     vancomycin     PRN Meds: sodium chloride, acetaminophen, acetaminophen, nitroGLYCERIN, ondansetron (ZOFRAN) IV, sodium chloride flush, traMADol   Vital Signs    Vitals:   03/29/21 0500 03/29/21 0600 03/29/21 0700 03/29/21 0800  BP: (!) 112/91 112/86 112/67 122/90  Pulse: 78 73 83 85  Resp: 20 11 17 13   Temp:      TempSrc:      SpO2: 97% 97% 97% 96%  Weight:      Height:        Intake/Output Summary (Last 24 hours) at 03/29/2021  0814 Last data filed at 03/29/2021 0800 Gross per 24 hour  Intake 2480.16 ml  Output 1900 ml  Net 580.16 ml    Last 3 Weights 03/28/2021 03/27/2021 03/26/2021  Weight (lbs) 265 lb 14.4 oz 268 lb 15.4 oz 265 lb  Weight (kg) 120.611 kg 122 kg 120.203 kg      Telemetry    NSR - Personally Reviewed  ECG    NSR, inferior Q waves, no acute ST or T wave changes.  - Personally Reviewed  Physical Exam   GEN: No acute distress.   Neck: No JVD Cardiac: RRR, no murmurs, rubs, or gallops.  Respiratory: Clear to auscultation bilaterally. GI: Soft, nontender, non-distended  MS: No edema; No deformity. Radial band in place.  Neuro:  Nonfocal  Psych: Normal affect   Labs    High Sensitivity Troponin:   Recent Labs  Lab 03/26/21 0926 03/26/21 1144 03/27/21 1809  TROPONINIHS 196* 263* 288*      Chemistry Recent Labs  Lab 03/26/21 0926 03/27/21 0422 03/27/21 1536 03/27/21 2100 03/28/21 0749 03/28/21 2238 03/29/21 0454  NA 138 137 136 135 137 135 137  K 3.5 3.6 3.0* 4.2 3.9  3.8 3.8  CL 103 105 104 106 104  --  104  CO2 24 23 24 23 23   --  23  GLUCOSE 240* 167* 284* 274* 200*  --  170*  BUN 12 13 11 11 9   --  10  CREATININE 1.05 0.96 1.18 1.01 0.86  --  0.95  CALCIUM 9.3 9.0 8.9 8.8* 9.1  --  9.0  MG  --   --  1.9 2.1  --   --   --   PROT 7.1 6.5  --   --   --   --   --   ALBUMIN 3.9 3.4*  --   --   --   --   --   AST 68* 29  --   --   --   --   --   ALT 59* 43  --   --   --   --   --   ALKPHOS 66 61  --   --   --   --   --   BILITOT 0.7 0.8  --   --   --   --   --   GFRNONAA >60 >60 >60 >60 >60  --  >60  ANIONGAP 11 9 8 6 10   --  10     Lipids  Recent Labs  Lab 03/27/21 0422  CHOL 151  TRIG 76  HDL 47  LDLCALC 89  CHOLHDL 3.2     Hematology Recent Labs  Lab 03/27/21 1536 03/28/21 0749 03/28/21 2238 03/29/21 0454  WBC 8.3 4.4  --  4.9  RBC 5.03 4.88  --  4.72  HGB 16.2 15.3 14.6 15.1  HCT 47.0 45.8 43.0 44.1  MCV 93.4 93.9  --  93.4  MCH 32.2 31.4   --  32.0  MCHC 34.5 33.4  --  34.2  RDW 12.7 12.8  --  12.7  PLT 201 199  --  181    Thyroid No results for input(s): TSH, FREET4 in the last 168 hours.  BNPNo results for input(s): BNP, PROBNP in the last 168 hours.  DDimer  Recent Labs  Lab 03/26/21 1101  DDIMER 0.35      Radiology    VAS US DOPPLER PRE CABG  Result Date: 03/28/2021 PREOPERATIVE VASCULAR EVALUATION Patient Name:  Gene Weeks  Date of Exam:   03/28/2021 Medical Rec #: [id number]      Accession #:    [id number] Date of Birth: January 03, 1976      Patient Gender: M Patient Age:   76 years Exam Location:  [facility] Procedure:      VAS US DOPPLER PRE CABG Referring Phys: Wynetta Fines --------------------------------------------------------------------------------  Indications:      Pre-CABG. Risk Factors:     Hypertension, Diabetes, prior MI. Limitations:      High bifurcation of carotid arteries Comparison Study: No prior studies. Performing Technologist: Rogelia Rohrer RVT, RDMS  Examination Guidelines: A complete evaluation includes B-mode imaging, spectral Doppler, color Doppler, and power Doppler as needed of all accessible portions of each vessel. Bilateral testing is considered an integral part of a complete examination. Limited examinations for reoccurring indications may be performed as noted.  Right Carotid Findings: +----------+--------+--------+--------+------------+------------------+             PSV cm/s EDV cm/s Stenosis Describe     Comments            +----------+--------+--------+--------+------------+------------------+  CCA Prox   73  14                                                 +----------+--------+--------+--------+------------+------------------+  CCA Distal 62       16                             intimal thickening  +----------+--------+--------+--------+------------+------------------+  ICA Prox   43       19       1-39%    heterogenous                      +----------+--------+--------+--------+------------+------------------+  ICA Distal 101      49                                                 +----------+--------+--------+--------+------------+------------------+  ECA        50       11                                                 +----------+--------+--------+--------+------------+------------------+ +----------+--------+-------+----------------+------------+             PSV cm/s EDV cms Describe         Arm Pressure  +----------+--------+-------+----------------+------------+  Subclavian 65               Multiphasic, WNL               +----------+--------+-------+----------------+------------+ +---------+--------+--+--------+--+---------+  Vertebral PSV cm/s 42 EDV cm/s 21 Antegrade  +---------+--------+--+--------+--+---------+ Left Carotid Findings: +----------+--------+--------+--------+------------+------------------+             PSV cm/s EDV cm/s Stenosis Describe     Comments            +----------+--------+--------+--------+------------+------------------+  CCA Prox   96       18                                                 +----------+--------+--------+--------+------------+------------------+  CCA Distal 70       22                             intimal thickening  +----------+--------+--------+--------+------------+------------------+  ICA Prox   42       20       1-39%    heterogenous                     +----------+--------+--------+--------+------------+------------------+  ICA Distal 69       35                                                 +----------+--------+--------+--------+------------+------------------+  ECA        68  9                                                  +----------+--------+--------+--------+------------+------------------+ +----------+--------+--------+----------------+------------+  Subclavian PSV cm/s EDV cm/s Describe         Arm Pressure  +----------+--------+--------+----------------+------------+              93                Multiphasic, WNL               +----------+--------+--------+----------------+------------+ +---------+--------+--+--------+--+---------+  Vertebral PSV cm/s 28 EDV cm/s 11 Antegrade  +---------+--------+--+--------+--+---------+  ABI Findings: +---------+------------------+-----+----------------+--------+  Right     Rt Pressure (mmHg) Index Waveform         Comment   +---------+------------------+-----+----------------+--------+  Brachial  137                      triphasic                  +---------+------------------+-----+----------------+--------+  PTA       255                1.86  biphasic                   +---------+------------------+-----+----------------+--------+  DP        134                0.98  biphasic                   +---------+------------------+-----+----------------+--------+  Great Toe 195                1.42  Non compressible           +---------+------------------+-----+----------------+--------+ +---------+------------------+-----+----------------+-------+  Left      Lt Pressure (mmHg) Index Waveform         Comment  +---------+------------------+-----+----------------+-------+  Brachial  137                      triphasic                 +---------+------------------+-----+----------------+-------+  PTA       163                1.19  biphasic                  +---------+------------------+-----+----------------+-------+  DP        154                1.12  triphasic                 +---------+------------------+-----+----------------+-------+  Great Toe 186                1.36  Non compressible          +---------+------------------+-----+----------------+-------+  Right Doppler Findings: +--------+--------+-----+---------+--------+  Site     Pressure Index Doppler   Comments  +--------+--------+-----+---------+--------+  Brachial 137            triphasic           +--------+--------+-----+---------+--------+  Radial                  triphasic            +--------+--------+-----+---------+--------+  Ulnar  triphasic           +--------+--------+-----+---------+--------+  Left Doppler Findings: +--------+--------+-----+---------+--------+  Site     Pressure Index Doppler   Comments  +--------+--------+-----+---------+--------+  Brachial 137            triphasic           +--------+--------+-----+---------+--------+  Radial                  triphasic           +--------+--------+-----+---------+--------+  Ulnar                   triphasic           +--------+--------+-----+---------+--------+  Summary: Right Carotid: Velocities in the right ICA are consistent with a 1-39% stenosis. Left Carotid: Velocities in the left ICA are consistent with a 1-39% stenosis. Vertebrals:  Bilateral vertebral arteries demonstrate antegrade flow. Subclavians: Normal flow hemodynamics were seen in bilateral subclavian              arteries. Right ABI: Resting right ankle-brachial index indicates noncompressible right lower extremity arteries. The right toe-brachial index is non-compressible. Left ABI: Resting left ankle-brachial index is within normal range. No evidence of significant left lower extremity arterial disease. The left toe-brachial index is non-compressible. Right Upper Extremity: Doppler waveforms remain within normal limits with right radial compression. Doppler waveforms remain within normal limits with right ulnar compression. Left Upper Extremity: Doppler waveforms remain within normal limits with left radial compression. Doppler waveforms remain within normal limits with left ulnar compression.  Electronically signed by Jamelle Haring on 03/28/2021 at 3:56:48 PM.    Final     Cardiac Studies   Echo: IMPRESSIONS     1. Left ventricular ejection fraction, by estimation, is 20 to 25%. The  left ventricle has severely decreased function. The left ventricle  demonstrates regional wall motion abnormalities (see scoring  diagram/findings for  description). The left  ventricular internal cavity size was mildly dilated. Left ventricular  diastolic parameters are consistent with Grade I diastolic dysfunction  (impaired relaxation).   2. Right ventricular systolic function is normal. The right ventricular  size is normal.   3. The mitral valve is normal in structure. No evidence of mitral valve  regurgitation. No evidence of mitral stenosis.   4. The aortic valve is normal in structure. Aortic valve regurgitation is  not visualized. Aortic valve sclerosis is present, with no evidence of  aortic valve stenosis.   5. The inferior vena cava is normal in size with greater than 50%  respiratory variability, suggesting right atrial pressure of 3 mmHg.   Comparison(s): No prior Echocardiogram.   Cardiac cath: LEFT HEART CATH AND CORONARY ANGIOGRAPHY   Conclusion      Prox RCA to Dist RCA lesion is 100% stenosed.  Faint left-to-right collaterals to the PDA system   Prox Cx lesion is 99% stenosed with 99% stenosed side branch in 1st Mrg. -> 1st Mrg lesion is 80% stenosed.   Beyond 1st Mrg: Prox Cx to Dist Cx lesion is 100% stenosed with 100% stenosed side branch in 2nd Mrg. (2nd Mrg fills via faint left-left collaterals distally)   Ramus lesion is 95% stenosed.  (Small caliber vessel)   Prox LAD to Mid LAD lesion is 55% stenosed.  Prior to 1st Diag   Mid LAD to Dist LAD lesion is 75% stenosed - beyond 1st Diag   Dist (Apical) LAD lesion is 90% stenosed with 90% stenosed side branch  in 2nd Diag.   LV end diastolic pressure is normal.   There is no aortic valve stenosis.   Severely Reduced LVEF by Echocardiogram   SUMMARY Severe Multivessel Occlusive CAD: Large RCA 100% proximal CTO (heavily calcified) -faint left-to-right collaterals to the PDA LCx occluded after OM1 with faint collaterals filling OM2; OM1 has bifurcation 99% stenosis followed by 80% stenosis Small caliber bifurcating Ramus Intermedius with ostial proximal 95%  stenosis (likely too small for revascularization) Moderate caliber LAD with diffuse segmental eccentric calcified 55 to 60% proximal stenosis prior to major D1 and mid vessel.  Beyond D1 75% segmental stenosis, and 95% apical Normal LVEDP with severely reduced EF of 20 to 25% by echocardiogram-WITH INFERIOR WALL AKINESIS and ANTERIOR HYPOKINESIS   RECOMMENDATIONS Admit to inpatient unit, restart IV heparin CVTS consultation with reduced EF and multivessel disease in a diabetic. Images reviewed with Dr. Martinique Convert from Lopressor to carvedilol, as blood pressure tolerates start initiating [place] for ISCHEMIC CARDIOMYOPATHY/CAD -> Entresto +/- Spironolactone if possible prior to d/c,, continue SGLT2-I High-dose high intensity statin, and aggressive glycemic control     Glenetta Hew, MD   Diagnostic Dominance: Right Intervention   Patient Profile     46 y.o. male with long history of HTN and IDDM presents with NSTEMI and near syncope  Assessment & Plan    Non-STEMI -Classic story.    EKG with inferior Q waves. High-sensitivity troponin 196> 263> 288.  On ASA, beta blocker, high dose statin. IV heparin  Cardiac cath with severe 3 vessel disease. Poorly suited for PCI   Surgical consultation for CABG- planned today with Dr Roxan Hockey     Echo with poor EF 25%.   2. Acute systolic CHF. EF 25% due to ischemic CM. Will stop lisinopril. Start losartan. Plan  switching to [facility] post op. Continue Coreg and [place]. Consider aldactone prior to DC. Will follow closely.  After revascularization and optimization of medical therapy repeat Echo in 2-3 months.     3. Syncope. Had VT/VF arrest and syncope. Now on amiodarone. Repleted potassium and magnesium. Will need Life vest on discharge with repeat assessment of EF in 2-3 months. If EF remains low may need ICD.  4.  Diabetes mellitus - hemoglobin A1c 8.2%. on insulin -Sliding scale insulin while admitted    5.  HTN - on beta blocker and losartan  6. HLD goal LDL < 55. Now on high dose statin.     For questions or updates, please contact [place] Please consult [email or web address] for contact info under        Signed, Veanna Dower [place], MD  03/29/2021, 8:14 AM

## 2021-03-29 NOTE — Discharge Summary (Signed)
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  compression. Doppler waveforms remain within normal limits with left ulnar compression.  Electronically signed by Jamelle Haring on 03/28/2021 at 3:56:48 PM.    Final        Discharge Instructions     Amb Referral to Cardiac Rehabilitation   Complete by: As directed    Will send Cardiac Rehab Phase 2 referral to [facility]   Diagnosis:  CABG NSTEMI     CABG X ___: 5   After initial evaluation and assessments completed: Virtual Based Care may be provided alone or in conjunction with Phase 2 Cardiac Rehab based on patient barriers.: Yes       Discharge Medications: Allergies as of 04/07/2021       Reactions   Amoxicillin Hives        Medication List     STOP taking these medications    lisinopril 20 MG tablet Commonly known as: ZESTRIL   promethazine-dextromethorphan 6.25-15 MG/5ML syrup Commonly known as: PROMETHAZINE-DM       TAKE these medications    acetaminophen 500 MG tablet Commonly known as: TYLENOL Take 1,000 mg by mouth every 6 (six) hours as needed for mild pain, fever or headache.   amiodarone 200 MG tablet Commonly known as: PACERONE Take 1 tablet (200 mg total) by mouth daily. Start taking on: April 08, 2021   aspirin 325 MG EC tablet Take 1 tablet (325 mg total) by mouth daily.   atorvastatin 80 MG tablet Commonly known as: LIPITOR Take 1 tablet (80 mg total) by mouth daily. Start taking on: April 08, 2021   carvedilol 3.125 MG tablet Commonly known as: COREG Take 1 tablet (3.125 mg total) by mouth 2 (two) times daily with a meal.   clopidogrel 75 MG tablet Commonly known as: PLAVIX Take 1 tablet (75 mg total) by mouth daily. Start taking on: April 08, 2021   Farxiga 10 MG Tabs tablet Generic drug: dapagliflozin propanediol TAKE 1 TABLET(10 MG) BY MOUTH DAILY What changed: See the new instructions.   furosemide 40 MG tablet Commonly known as: LASIX Take 1 tablet (40 mg total) by mouth daily. Start taking on: April 08, 2021   glucose blood test strip Commonly known as: OneTouch Verio USE TO CHECK BLOOD SUGAR THREE TIMES A DAY BEFORE MEALS AND PRN   Insulin Pen Needle 29G X 12.7MM Misc 1 Device by Does not apply route daily.   isosorbide mononitrate 30 MG 24 hr tablet Commonly known as: IMDUR Take 1 tablet (30 mg total) by mouth daily. Start taking on: April 08, 2021   metFORMIN 500 MG 24 hr tablet Commonly known as: GLUCOPHAGE-XR TAKE 2 TABLETS(1000 MG) BY MOUTH TWICE DAILY What changed:  how much to take how to take this when to take this additional instructions   NovoLOG FlexPen 100 UNIT/ML FlexPen Generic drug: insulin aspart INJECT 8 UNITS IF BLOOD SUGAR 71-140, 12 UNITS BLOOD SUGAR 141-200, 16 UNITS BLOOD SUGAR GREATER THAN 200 What changed: See the new instructions.   ONE TOUCH LANCETS Misc USE TO CHECK BLOOD SUGAR THREE TIMES A BEFORE MEALS AND PRN   oxyCODONE 5 MG immediate release tablet Commonly known as: Oxy IR/ROXICODONE Take 1-2 tablets (5-10 mg total) by mouth every 4 (four) hours as needed for severe pain.   potassium chloride SA 20 MEQ tablet Commonly known as: KLOR-CON M Take 1 tablet (20 mEq total) by mouth daily. Start  taking on: April 08, 2021   sacubitril-valsartan 24-26 MG Commonly known as: ENTRESTO Take 1 tablet by  mouth 2 (two) times daily.   sildenafil 100 MG tablet Commonly known as: Viagra Take 0.5-1 tablets (50-100 mg total) by mouth daily as needed for erectile dysfunction.   Soliqua 100-33 UNT-MCG/ML Sopn Generic drug: Insulin Glargine-Lixisenatide Inject 21 Units into the skin at bedtime.               Durable Medical Equipment  (From admission, onward)           Start     Ordered   04/05/21 0942  For home use only DME Walker rolling  Once       Question Answer Comment  Walker: With 5 Inch Wheels   Patient needs a walker to treat with the following condition Physical deconditioning      04/05/21 0941   04/05/21 0942  For home use only DME 3 n 1  Once        04/05/21 0941   04/03/21 1213  For home use only DME Vest life vest  Once        04/03/21 1212           The patient has been discharged on:   1.Beta Blocker:  Yes [ x  ]                              No   [   ]                              If No, reason:  2.Ace Inhibitor/ARB: Yes [  x ]                                     No  [    ]                                     If No, reason:  3.Statin:   Yes [ x  ]                  No  [   ]                  If No, reason:  4.Ecasa:  Yes  [  x ]                  No   [   ]                  If No, reason:  Patient had ACS upon admission:  Plavix/P2Y12 inhibitor: Yes [  x ]                                      No  [   ]   Follow Up Appointments:  Follow-up Information     [name], MD Follow up.   Specialty: Family Medicine Why: Call for a follow up appointment regarding further surveillance of HGA1C 8.2 and diabetes management Contact information: [place] [address] [phone]         Melrose Nakayama, MD. Go on 05/01/2021.   Specialty: Cardiothoracic Surgery Why: PA/LAT CXR to  be taken (at [facility]  which is in the same building as Dr. Leonarda Salon office) on 03/07 at 11:30 am;Appointment time is at 12:00 pm Contact information: [place] [address] [phone]         Warren Lacy, PA-C. Go on 04/18/2021.   Specialty: Internal Medicine Why: Appointment time is at 8:25 am Contact information: [place] [address] [place]. Go to.   Specialty: Cardiology Why: Friday, February 17 @ 10AM for [facility] clinic within [place]. Bring all medication to your appointment.  underground parking at [employer], off [address]. [address]. Contact information: [address] [id number] [place], [facility] Follow up.   Why: rolling walker and 3n1 arranged- to be delivered to room prior to discharge Contact information: [place] [address] [phone]                 Signed: Joline Maxcy 04/07/2021, 12:30 PM

## 2021-03-29 NOTE — Interval H&P Note (Signed)
History and Physical Interval Note:  No issues overnight For CABG today  03/29/2021 8:58 AM  Benedetto Coons  has presented today for surgery, with the diagnosis of CAD.  The various methods of treatment have been discussed with the patient and family. After consideration of risks, benefits and other options for treatment, the patient has consented to  Procedure(s): CORONARY ARTERY BYPASS GRAFTING (CABG) (N/A) RADIAL ARTERY HARVEST (Left) TRANSESOPHAGEAL ECHOCARDIOGRAM (TEE) (N/A) as a surgical intervention.  The patient's history has been reviewed, patient examined, no change in status, stable for surgery.  I have reviewed the patient's chart and labs.  Questions were answered to the patient's satisfaction.     Loreli Slot

## 2021-03-29 NOTE — Anesthesia Postprocedure Evaluation (Signed)
Anesthesia Post Note  Patient: Gene Weeks  Procedure(s) Performed: CORONARY ARTERY BYPASS GRAFTING (CABG) TIMES FIVE, USING LEFT INTERNAL MAMMARY ARTERY, LEFT RADIAL ARTERY, AND ENDOSCOPICALLY HARVESTED RIGHT GREATER SAPHENOUS VEIN (Chest) RADIAL ARTERY HARVEST (Left: Arm Lower) TRANSESOPHAGEAL ECHOCARDIOGRAM (TEE) ENDOVEIN HARVEST OF GREATER SAPHENOUS VEIN (Right: Leg Lower)     Patient location during evaluation: SICU Anesthesia Type: General Level of consciousness: patient remains intubated per anesthesia plan and sedated Pain management: pain level controlled Vital Signs Assessment: post-procedure vital signs reviewed and stable Respiratory status: patient remains intubated per anesthesia plan and patient on ventilator - see flowsheet for VS Cardiovascular status: stable (on Epi, Milrinone, Neo infusions) Postop Assessment: no apparent nausea or vomiting Anesthetic complications: no   No notable events documented.  Last Vitals:  Vitals:   03/29/21 1149 03/29/21 1844  BP:  (!) 104/37  Pulse:  87  Resp: (!) 0 12  Temp:    SpO2:  100%    Last Pain:  Vitals:   03/29/21 1114  TempSrc: Oral  PainSc: 0-No pain                 Gene Weeks,E. Loring Liskey

## 2021-03-29 NOTE — Anesthesia Preprocedure Evaluation (Signed)
Anesthesia Evaluation  Patient identified by MRN, date of birth, ID band Patient awake    Reviewed: Allergy & Precautions, NPO status , Patient's Chart, lab work & pertinent test results  History of Anesthesia Complications Negative for: history of anesthetic complications  Airway Mallampati: I  TM Distance: >3 FB Neck ROM: Full    Dental  (+) Teeth Intact, Dental Advisory Given   Pulmonary neg pulmonary ROS, neg shortness of breath, neg sleep apnea, neg COPD, neg recent URI,    breath sounds clear to auscultation       Cardiovascular hypertension, Pt. on medications (-) angina+ CAD and + Past MI   Rhythm:Regular  1. Left ventricular ejection fraction, by estimation, is 20 to 25%. The  left ventricle has severely decreased function. The left ventricle  demonstrates regional wall motion abnormalities (see scoring  diagram/findings for description). The left  ventricular internal cavity size was mildly dilated. Left ventricular  diastolic parameters are consistent with Grade I diastolic dysfunction  (impaired relaxation).  2. Right ventricular systolic function is normal. The right ventricular  size is normal.  3. The mitral valve is normal in structure. No evidence of mitral valve  regurgitation. No evidence of mitral stenosis.  4. The aortic valve is normal in structure. Aortic valve regurgitation is  not visualized. Aortic valve sclerosis is present, with no evidence of  aortic valve stenosis.  5. The inferior vena cava is normal in size with greater than 50%  respiratory variability, suggesting right atrial pressure of 3 mmHg.      Prox RCA to Dist RCA lesion is 100% stenosed.  Faint left-to-right collaterals to the PDA system   Prox Cx lesion is 99% stenosed with 99% stenosed side branch in 1st Mrg. -> 1st Mrg lesion is 80% stenosed.   Beyond 1st Mrg: Prox Cx to Dist Cx lesion is 100% stenosed with 100% stenosed side  branch in 2nd Mrg. (2nd Mrg fills via faint left-left collaterals distally)   Ramus lesion is 95% stenosed.  (Small caliber vessel)   Prox LAD to Mid LAD lesion is 55% stenosed.  Prior to 1st Diag   Mid LAD to Dist LAD lesion is 75% stenosed - beyond 1st Diag   Dist (Apical) LAD lesion is 90% stenosed with 90% stenosed side branch in 2nd Diag.   LV end diastolic pressure is normal.   There is no aortic valve stenosis.   Severely Reduced LVEF by Echocardiogram     Neuro/Psych neg Seizures negative psych ROS   GI/Hepatic negative GI ROS, Neg liver ROS,   Endo/Other  diabetes, Insulin DependentLab Results      Component                Value               Date                      HGBA1C                   8.2 (H)             03/26/2021             Renal/GU negative Renal ROSLab Results      Component                Value               Date  CREATININE               0.95                03/29/2021           Lab Results      Component                Value               Date                      K                        3.8                 03/29/2021                Musculoskeletal  (+) Arthritis ,   Abdominal   Peds  Hematology negative hematology ROS (+) Lab Results      Component                Value               Date                      WBC                      4.9                 03/29/2021                HGB                      15.1                03/29/2021                HCT                      44.1                03/29/2021                MCV                      93.4                03/29/2021                PLT                      181                 03/29/2021              Anesthesia Other Findings   Reproductive/Obstetrics                             Anesthesia Physical Anesthesia Plan  ASA: 4  Anesthesia Plan: General   Post-op Pain Management:    Induction: Intravenous  PONV Risk Score and Plan:  2 and Treatment may vary due to age or medical condition  Airway Management Planned: Oral ETT  Additional Equipment: Arterial line, CVP, PA Cath, TEE and Ultrasound Guidance Line Placement  Intra-op Plan:   Post-operative Plan: Post-operative intubation/ventilation  Informed Consent: I have reviewed the patients History and Physical, chart, labs and discussed the procedure including the risks, benefits and alternatives for the proposed anesthesia with the patient or authorized representative who has indicated his/her understanding and acceptance.     Dental advisory given  Plan Discussed with: CRNA and Anesthesiologist  Anesthesia Plan Comments:         Anesthesia Quick Evaluation

## 2021-03-29 NOTE — Progress Notes (Signed)
CARDIAC REHAB PHASE I   Preop education completed with pt and family. Pt states he has help upon d/c. Pt and family any further questions or concerns at this time. Will continue to follow throughout his hospital stay.  [id number] Rufina Falco, RN BSN 03/29/2021 9:36 AM

## 2021-03-29 NOTE — Anesthesia Procedure Notes (Signed)
Central Venous Catheter Insertion Performed by: Val Eagle, MD, anesthesiologist Start/End2/03/2021 12:15 PM, 03/29/2021 12:25 PM Patient location: OR. Preanesthetic checklist: patient identified, IV checked, risks and benefits discussed, surgical consent, monitors and equipment checked, pre-op evaluation, timeout performed and anesthesia consent Hand hygiene performed  and maximum sterile barriers used  PA cath was placed.Swan type:thermodilution PA Cath depth:49 Procedure performed without using ultrasound guided technique. Attempts: 1 Patient tolerated the procedure well with no immediate complications.

## 2021-03-29 NOTE — Anesthesia Procedure Notes (Signed)
Procedure Name: Intubation Date/Time: 03/29/2021 12:08 PM Performed by: Eligha Bridegroom, CRNA Pre-anesthesia Checklist: Patient identified, Emergency Drugs available, Suction available, Patient being monitored and Timeout performed Patient Re-evaluated:Patient Re-evaluated prior to induction Oxygen Delivery Method: Circle system utilized Preoxygenation: Pre-oxygenation with 100% oxygen Induction Type: IV induction Ventilation: Mask ventilation with difficulty and Oral airway inserted - appropriate to patient size Laryngoscope Size: Mac and 4 Grade View: Grade I Tube type: Oral Tube size: 8.5 mm Number of attempts: 1 Airway Equipment and Method: Stylet Placement Confirmation: ETT inserted through vocal cords under direct vision, positive ETCO2 and breath sounds checked- equal and bilateral Secured at: 23 cm Tube secured with: Tape Dental Injury: Teeth and Oropharynx as per pre-operative assessment

## 2021-03-30 ENCOUNTER — Encounter (HOSPITAL_COMMUNITY): Payer: Self-pay | Admitting: Thoracic Surgery (Cardiothoracic Vascular Surgery)

## 2021-03-30 ENCOUNTER — Inpatient Hospital Stay (HOSPITAL_COMMUNITY): Payer: BC Managed Care – PPO

## 2021-03-30 DIAGNOSIS — I255 Ischemic cardiomyopathy: Secondary | ICD-10-CM | POA: Diagnosis not present

## 2021-03-30 DIAGNOSIS — R55 Syncope and collapse: Secondary | ICD-10-CM | POA: Diagnosis not present

## 2021-03-30 DIAGNOSIS — I214 Non-ST elevation (NSTEMI) myocardial infarction: Secondary | ICD-10-CM | POA: Diagnosis not present

## 2021-03-30 DIAGNOSIS — I5021 Acute systolic (congestive) heart failure: Secondary | ICD-10-CM | POA: Diagnosis not present

## 2021-03-30 LAB — GLUCOSE, CAPILLARY
Glucose-Capillary: 131 mg/dL — ABNORMAL HIGH (ref 70–99)
Glucose-Capillary: 134 mg/dL — ABNORMAL HIGH (ref 70–99)
Glucose-Capillary: 134 mg/dL — ABNORMAL HIGH (ref 70–99)
Glucose-Capillary: 139 mg/dL — ABNORMAL HIGH (ref 70–99)
Glucose-Capillary: 142 mg/dL — ABNORMAL HIGH (ref 70–99)
Glucose-Capillary: 143 mg/dL — ABNORMAL HIGH (ref 70–99)
Glucose-Capillary: 146 mg/dL — ABNORMAL HIGH (ref 70–99)
Glucose-Capillary: 152 mg/dL — ABNORMAL HIGH (ref 70–99)
Glucose-Capillary: 153 mg/dL — ABNORMAL HIGH (ref 70–99)
Glucose-Capillary: 154 mg/dL — ABNORMAL HIGH (ref 70–99)
Glucose-Capillary: 164 mg/dL — ABNORMAL HIGH (ref 70–99)
Glucose-Capillary: 172 mg/dL — ABNORMAL HIGH (ref 70–99)
Glucose-Capillary: 187 mg/dL — ABNORMAL HIGH (ref 70–99)
Glucose-Capillary: 238 mg/dL — ABNORMAL HIGH (ref 70–99)
Glucose-Capillary: 248 mg/dL — ABNORMAL HIGH (ref 70–99)

## 2021-03-30 LAB — BASIC METABOLIC PANEL
Anion gap: 6 (ref 5–15)
Anion gap: 9 (ref 5–15)
BUN: 9 mg/dL (ref 6–20)
BUN: 12 mg/dL (ref 6–20)
CO2: 20 mmol/L — ABNORMAL LOW (ref 22–32)
CO2: 20 mmol/L — ABNORMAL LOW (ref 22–32)
Calcium: 8.3 mg/dL — ABNORMAL LOW (ref 8.9–10.3)
Calcium: 8.5 mg/dL — ABNORMAL LOW (ref 8.9–10.3)
Chloride: 108 mmol/L (ref 98–111)
Chloride: 102 mmol/L (ref 98–111)
Creatinine, Ser: 1.13 mg/dL (ref 0.61–1.24)
Creatinine, Ser: 1.28 mg/dL — ABNORMAL HIGH (ref 0.61–1.24)
GFR, Estimated: >60 mL/min (ref >60)
GFR, Estimated: >60 mL/min (ref >60)
Glucose, Bld: 151 mg/dL — ABNORMAL HIGH (ref 70–99)
Glucose, Bld: 223 mg/dL — ABNORMAL HIGH (ref 70–99)
Potassium: 4.5 mmol/L (ref 3.5–5.1)
Potassium: 5.7 mmol/L — ABNORMAL HIGH (ref 3.5–5.1)
Sodium: 134 mmol/L — ABNORMAL LOW (ref 135–145)
Sodium: 131 mmol/L — ABNORMAL LOW (ref 135–145)

## 2021-03-30 LAB — MAGNESIUM
Magnesium: 2.2 mg/dL (ref 1.7–2.4)
Magnesium: 1.9 mg/dL (ref 1.7–2.4)

## 2021-03-30 LAB — CBC
HCT: 36.7 % — ABNORMAL LOW (ref 39.0–52.0)
HCT: 37.1 % — ABNORMAL LOW (ref 39.0–52.0)
Hemoglobin: 12.7 g/dL — ABNORMAL LOW (ref 13.0–17.0)
Hemoglobin: 12.5 g/dL — ABNORMAL LOW (ref 13.0–17.0)
MCH: 32.2 pg (ref 26.0–34.0)
MCH: 31.9 pg (ref 26.0–34.0)
MCHC: 34.6 g/dL (ref 30.0–36.0)
MCHC: 33.7 g/dL (ref 30.0–36.0)
MCV: 93.1 fL (ref 80.0–100.0)
MCV: 94.6 fL (ref 80.0–100.0)
Platelets: 158 10*3/uL (ref 150–400)
Platelets: 148 10*3/uL — ABNORMAL LOW (ref 150–400)
RBC: 3.94 MIL/uL — ABNORMAL LOW (ref 4.22–5.81)
RBC: 3.92 MIL/uL — ABNORMAL LOW (ref 4.22–5.81)
RDW: 12.5 % (ref 11.5–15.5)
RDW: 12.9 % (ref 11.5–15.5)
WBC: 11.2 10*3/uL — ABNORMAL HIGH (ref 4.0–10.5)
WBC: 12 10*3/uL — ABNORMAL HIGH (ref 4.0–10.5)
nRBC: 0 % (ref 0.0–0.2)
nRBC: 0 % (ref 0.0–0.2)

## 2021-03-30 LAB — POCT I-STAT 7, (LYTES, BLD GAS, ICA,H+H)
Acid-base deficit: 5 mmol/L — ABNORMAL HIGH (ref 0.0–2.0)
Bicarbonate: 21 mmol/L (ref 20.0–28.0)
Calcium, Ion: 1.24 mmol/L (ref 1.15–1.40)
HCT: 39 % (ref 39.0–52.0)
Hemoglobin: 13.3 g/dL (ref 13.0–17.0)
O2 Saturation: 99 %
Patient temperature: 37.7
Potassium: 5.1 mmol/L (ref 3.5–5.1)
Sodium: 136 mmol/L (ref 135–145)
TCO2: 22 mmol/L (ref 22–32)
pCO2 arterial: 40.8 mmHg (ref 32.0–48.0)
pH, Arterial: 7.324 — ABNORMAL LOW (ref 7.350–7.450)
pO2, Arterial: 161 mmHg — ABNORMAL HIGH (ref 83.0–108.0)

## 2021-03-30 LAB — COOXEMETRY PANEL
Carboxyhemoglobin: 1.4 % (ref 0.5–1.5)
Methemoglobin: 0.9 % (ref 0.0–1.5)
O2 Saturation: 68.2 %
Total hemoglobin: 12.5 g/dL (ref 12.0–16.0)

## 2021-03-30 MED ORDER — INSULIN DETEMIR 100 UNIT/ML ~~LOC~~ SOLN
30.0000 [IU] | Freq: Two times a day (BID) | SUBCUTANEOUS | Status: DC
  Administered 2021-03-30 – 2021-03-31 (×3): 30 [IU] via SUBCUTANEOUS
  Filled 2021-03-30 (×4): qty 0.3

## 2021-03-30 MED ORDER — AMIODARONE HCL IN DEXTROSE 360-4.14 MG/200ML-% IV SOLN
30.0000 mg/h | INTRAVENOUS | Status: AC
  Administered 2021-03-30 – 2021-04-01 (×6): 30 mg/h via INTRAVENOUS
  Filled 2021-03-30 (×7): qty 200

## 2021-03-30 MED ORDER — FUROSEMIDE 10 MG/ML IJ SOLN
20.0000 mg | Freq: Two times a day (BID) | INTRAMUSCULAR | Status: DC
  Administered 2021-03-30 – 2021-04-02 (×8): 20 mg via INTRAVENOUS
  Filled 2021-03-30 (×9): qty 2

## 2021-03-30 MED ORDER — INSULIN ASPART 100 UNIT/ML IJ SOLN
0.0000 [IU] | INTRAMUSCULAR | Status: DC
  Administered 2021-03-30: 4 [IU] via SUBCUTANEOUS
  Administered 2021-03-30: 8 [IU] via SUBCUTANEOUS
  Administered 2021-03-30: 2 [IU] via SUBCUTANEOUS
  Administered 2021-03-31 (×3): 8 [IU] via SUBCUTANEOUS
  Administered 2021-03-31: 4 [IU] via SUBCUTANEOUS
  Administered 2021-03-31: 8 [IU] via SUBCUTANEOUS
  Administered 2021-03-31: 4 [IU] via SUBCUTANEOUS
  Administered 2021-04-01: 2 [IU] via SUBCUTANEOUS
  Administered 2021-04-01 (×2): 4 [IU] via SUBCUTANEOUS
  Administered 2021-04-01: 8 [IU] via SUBCUTANEOUS
  Administered 2021-04-01: 2 [IU] via SUBCUTANEOUS
  Administered 2021-04-01: 8 [IU] via SUBCUTANEOUS
  Administered 2021-04-02: 4 [IU] via SUBCUTANEOUS

## 2021-03-30 MED ORDER — ISOSORBIDE MONONITRATE ER 30 MG PO TB24
30.0000 mg | ORAL_TABLET | Freq: Every day | ORAL | Status: DC
  Administered 2021-03-30 – 2021-04-07 (×9): 30 mg via ORAL
  Filled 2021-03-30 (×9): qty 1

## 2021-03-30 MED ORDER — ALBUMIN HUMAN 5 % IV SOLN
12.5000 g | Freq: Once | INTRAVENOUS | Status: AC
  Administered 2021-03-30: 12.5 g via INTRAVENOUS
  Filled 2021-03-30: qty 250

## 2021-03-30 MED ORDER — ENOXAPARIN SODIUM 40 MG/0.4ML IJ SOSY
40.0000 mg | PREFILLED_SYRINGE | Freq: Every day | INTRAMUSCULAR | Status: DC
  Administered 2021-03-30 – 2021-04-06 (×8): 40 mg via SUBCUTANEOUS
  Filled 2021-03-30 (×8): qty 0.4

## 2021-03-30 NOTE — Progress Notes (Signed)
Progress Note  Patient Name: Gene Weeks Date of Encounter: 03/30/2021  [facility] Cardiologist: Chane[place] Spees [place], MD   Subjective   Seen post op day #1. Doing OK. No arrhythmia seen. Atrially paced.   Inpatient Medications    Scheduled Meds:  acetaminophen  1,000 mg Oral Q6H   Or   acetaminophen (TYLENOL) oral liquid 160 mg/5 mL  1,000 mg Per Tube Q6H   aspirin EC  325 mg Oral Daily   Or   aspirin  324 mg Per Tube Daily   atorvastatin  80 mg Oral Daily   bisacodyl  10 mg Oral Daily   Or   bisacodyl  10 mg Rectal Daily   Chlorhexidine Gluconate Cloth  6 each Topical Q0600   docusate sodium  200 mg Oral Daily   isosorbide mononitrate  15 mg Oral Daily   metoprolol tartrate  12.5 mg Oral BID   Or   metoprolol tartrate  12.5 mg Per Tube BID   [START ON 03/31/2021] pantoprazole  40 mg Oral Daily   sodium chloride flush  3 mL Intravenous Q12H   Continuous Infusions:  sodium chloride 20 mL/hr at 03/29/21 2000   sodium chloride     sodium chloride 20 mL/hr at 03/29/21 1845   albumin human 12.5 g (03/29/21 2203)   amiodarone Stopped (03/29/21 1443)   dexmedetomidine (PRECEDEX) IV infusion Stopped (03/29/21 2135)   epinephrine 1 mcg/min (03/29/21 2100)   insulin 3.4 Units/hr (03/30/21 0700)   lactated ringers     lactated ringers 20 mL/hr at 03/30/21 0700   lactated ringers 20 mL/hr at 03/30/21 0700   levofloxacin (LEVAQUIN) IV     milrinone 0.25 mcg/kg/min (03/30/21 0700)   nitroGLYCERIN 7 mcg/min (03/30/21 0700)   phenylephrine (NEO-SYNEPHRINE) Adult infusion 40 mcg/min (03/30/21 0700)   PRN Meds: sodium chloride, albumin human, dextrose, lactated ringers, metoprolol tartrate, morphine injection, ondansetron (ZOFRAN) IV, oxyCODONE, sodium chloride flush, traMADol   Vital Signs    Vitals:   03/30/21 0615 03/30/21 0630 03/30/21 0645 03/30/21 0700  BP:      Pulse: 89 89 89 89  Resp: (!) 27 (!) 30 (!) 26 (!) 28  Temp: (!) 100.6 F (38.1 C) (!) 100.4 F (38 C)  100.2 F (37.9 C) 100 F (37.8 C)  TempSrc:      SpO2: 97% 98% 98% 98%  Weight:      Height:        Intake/Output Summary (Last 24 hours) at 03/30/2021 0741 Last data filed at 03/30/2021 0700 Gross per 24 hour  Intake 4303.87 ml  Output 1726 ml  Net 2577.87 ml    Last 3 Weights 03/30/2021 03/28/2021 03/27/2021  Weight (lbs) 280 lb 13.9 oz 265 lb 14.4 oz 268 lb 15.4 oz  Weight (kg) 127.4 kg 120.611 kg 122 kg      Telemetry    Atrially paced. - Personally Reviewed  ECG    NSR, inferior Q waves, lateral T wave inversion.  - Personally Reviewed  Physical Exam   GEN: No acute distress.  Extubated. Neck: No JVD, lines in place. Cardiac: RRR, no murmurs, rubs, or gallops.  Respiratory: Clear anteriorly GI: Soft, nontender, non-distended  MS: No edema; No deformity. Left radial dressing in place.  Neuro:  Nonfocal  Psych: Normal affect   Labs    High Sensitivity Troponin:   Recent Labs  Lab 03/26/21 0926 03/26/21 1144 03/27/21 1809  TROPONINIHS 196* 263* 288*      Chemistry Recent Labs  Lab  03/26/21 0926 03/27/21 0422 03/27/21 1536 03/27/21 2100 03/28/21 0749 03/28/21 2238 03/29/21 0454 03/29/21 1245 03/29/21 1700 03/29/21 1732 03/29/21 1736 03/29/21 1904 03/29/21 2247 03/30/21 0015 03/30/21 0429  NA 138 137 136 135 137   < > 137   < > 138   < > 139   < > 137 136 134*  K 3.5 3.6 3.0* 4.2 3.9   < > 3.8   < > 4.5   < > 3.8   < > 4.9 5.1 4.5  CL 103 105 104 106 104  --  104   < > 103  --  103  --   --   --  108  CO2 24 23 24 23 23   --  23  --   --   --   --   --   --   --  20*  GLUCOSE 240* 167* 284* 274* 200*  --  170*   < > 108*  --  117*  --   --   --  151*  BUN 12 13 11 11 9   --  10   < > 8  --  7  --   --   --  9  CREATININE 1.05 0.96 1.18 1.01 0.86  --  0.95   < > 0.70  --  0.70  --   --   --  1.13  CALCIUM 9.3 9.0 8.9 8.8* 9.1  --  9.0  --   --   --   --   --   --   --  8.3*  MG  --   --  1.9 2.1  --   --   --   --   --   --   --   --   --   --  2.2   PROT 7.1 6.5  --   --   --   --   --   --   --   --   --   --   --   --   --   ALBUMIN 3.9 3.4*  --   --   --   --   --   --   --   --   --   --   --   --   --   AST 68* 29  --   --   --   --   --   --   --   --   --   --   --   --   --   ALT 59* 43  --   --   --   --   --   --   --   --   --   --   --   --   --   ALKPHOS 66 61  --   --   --   --   --   --   --   --   --   --   --   --   --   BILITOT 0.7 0.8  --   --   --   --   --   --   --   --   --   --   --   --   --   GFRNONAA >60 >60 >60 >60 >60  --  >60  --   --   --   --   --   --   --  >  60  ANIONGAP 11 9 8 6 10   --  10  --   --   --   --   --   --   --  6   < > = values in this interval not displayed.     Lipids  Recent Labs  Lab 03/27/21 0422  CHOL 151  TRIG 76  HDL 47  LDLCALC 89  CHOLHDL 3.2     Hematology Recent Labs  Lab 03/29/21 0454 03/29/21 1245 03/29/21 1619 03/29/21 1644 03/29/21 1858 03/29/21 1904 03/29/21 2247 03/30/21 0015 03/30/21 0429  WBC 4.9  --   --   --  9.3  --   --   --  11.2*  RBC 4.72  --   --   --  3.89*  --   --   --  3.94*  HGB 15.1   < > 12.2*   < > 12.7*   < > 13.6 13.3 12.7*  HCT 44.1   < > 35.7*   < > 36.5*   < > 40.0 39.0 36.7*  MCV 93.4  --   --   --  93.8  --   --   --  93.1  MCH 32.0  --   --   --  32.6  --   --   --  32.2  MCHC 34.2  --   --   --  34.8  --   --   --  34.6  RDW 12.7  --   --   --  12.7  --   --   --  12.5  PLT 181  --  156  --  132*  --   --   --  158   < > = values in this interval not displayed.    Thyroid No results for input(s): TSH, FREET4 in the last 168 hours.  BNPNo results for input(s): BNP, PROBNP in the last 168 hours.  DDimer  Recent Labs  Lab 03/26/21 1101  DDIMER 0.35      Radiology    DG Chest Port 1 View  Result Date: 03/29/2021 CLINICAL DATA:  Status post coronary bypass grafting EXAM: PORTABLE CHEST 1 VIEW COMPARISON:  Film from earlier in the same day FINDINGS: Cardiac shadow is enlarged but stable. Postsurgical changes are  again seen. Swan-Ganz catheter is noted within the right pulmonary artery. Endotracheal tube and gastric catheter are noted in satisfactory position. Mediastinal drain and left thoracostomy catheter are seen as well. No pneumothorax is noted. Poor inspiratory effort is noted without focal infiltrate. IMPRESSION: Tubes and lines as described above No acute abnormality noted. Electronically Signed   By: Inez Catalina M.D.   On: 03/29/2021 19:51   DG Chest Port 1 View  Result Date: 03/29/2021 CLINICAL DATA:  Missing needle EXAM: PORTABLE CHEST 1 VIEW COMPARISON:  Radiograph 03/26/2021 FINDINGS: There is a metallic tubular device overlying the mid chest oriented vertically. Endotracheal tube overlies the upper trachea. There is a pulmonary artery catheter overlying the pulmonary artery. Interval median sternotomy and postsurgical changes of CABG with circular radiopaque marker overlying the third from the top median sternotomy wire and mediastinal surgical clips noted. Mediastinal drain and left chest tube in place. No pneumothorax. No focal airspace consolidation. No pleural effusion. There is no evidence of retained radiopaque foreign body in the shape of a needle. IMPRESSION: No evidence of retained radiopaque foreign body in the shape of a needle. Postoperative chest without focal airspace consolidation, large  effusion, or visible pneumothorax. These results were called by telephone at the time of interpretation on 03/29/2021 at 6:34 pm to provider in [place], who verbally acknowledged these results. Per discussion, the missing needle size is 8-0. Note this frontal chest radiograph likely has low sensitivity in the detection of a needle of this size. Electronically Signed   By: Maurine Simmering M.D.   On: 03/29/2021 18:36   VAS US DOPPLER PRE CABG  Result Date: 03/28/2021 PREOPERATIVE VASCULAR EVALUATION Patient Name:  Gene Weeks  Date of Exam:   03/28/2021 Medical Rec #: [id number]      Accession #:    [id number] Date of  Birth: 01/17/1976      Patient Gender: M Patient Age:   46 years Exam Location:  [facility] Procedure:      VAS US DOPPLER PRE CABG Referring Phys: Wynetta Fines --------------------------------------------------------------------------------  Indications:      Pre-CABG. Risk Factors:     Hypertension, Diabetes, prior MI. Limitations:      High bifurcation of carotid arteries Comparison Study: No prior studies. Performing Technologist: Rogelia Rohrer RVT, RDMS  Examination Guidelines: A complete evaluation includes B-mode imaging, spectral Doppler, color Doppler, and power Doppler as needed of all accessible portions of each vessel. Bilateral testing is considered an integral part of a complete examination. Limited examinations for reoccurring indications may be performed as noted.  Right Carotid Findings: +----------+--------+--------+--------+------------+------------------+             PSV cm/s EDV cm/s Stenosis Describe     Comments            +----------+--------+--------+--------+------------+------------------+  CCA Prox   73       14                                                 +----------+--------+--------+--------+------------+------------------+  CCA Distal 62       16                             intimal thickening  +----------+--------+--------+--------+------------+------------------+  ICA Prox   43       19       1-39%    heterogenous                     +----------+--------+--------+--------+------------+------------------+  ICA Distal 101      49                                                 +----------+--------+--------+--------+------------+------------------+  ECA        50       11                                                 +----------+--------+--------+--------+------------+------------------+ +----------+--------+-------+----------------+------------+             PSV cm/s EDV cms Describe         Arm Pressure  +----------+--------+-------+----------------+------------+  Subclavian 65  Multiphasic, WNL               +----------+--------+-------+----------------+------------+ +---------+--------+--+--------+--+---------+  Vertebral PSV cm/s 42 EDV cm/s 21 Antegrade  +---------+--------+--+--------+--+---------+ Left Carotid Findings: +----------+--------+--------+--------+------------+------------------+             PSV cm/s EDV cm/s Stenosis Describe     Comments            +----------+--------+--------+--------+------------+------------------+  CCA Prox   96       18                                                 +----------+--------+--------+--------+------------+------------------+  CCA Distal 70       22                             intimal thickening  +----------+--------+--------+--------+------------+------------------+  ICA Prox   42       20       1-39%    heterogenous                     +----------+--------+--------+--------+------------+------------------+  ICA Distal 69       35                                                 +----------+--------+--------+--------+------------+------------------+  ECA        68       9                                                  +----------+--------+--------+--------+------------+------------------+ +----------+--------+--------+----------------+------------+  Subclavian PSV cm/s EDV cm/s Describe         Arm Pressure  +----------+--------+--------+----------------+------------+             93                Multiphasic, WNL               +----------+--------+--------+----------------+------------+ +---------+--------+--+--------+--+---------+  Vertebral PSV cm/s 28 EDV cm/s 11 Antegrade  +---------+--------+--+--------+--+---------+  ABI Findings: +---------+------------------+-----+----------------+--------+  Right     Rt Pressure (mmHg) Index Waveform         Comment   +---------+------------------+-----+----------------+--------+  Brachial  137                      triphasic                   +---------+------------------+-----+----------------+--------+  PTA       255                1.86  biphasic                   +---------+------------------+-----+----------------+--------+  DP        134                0.98  biphasic                   +---------+------------------+-----+----------------+--------+  Great Toe 195  1.42  Non compressible           +---------+------------------+-----+----------------+--------+ +---------+------------------+-----+----------------+-------+  Left      Lt Pressure (mmHg) Index Waveform         Comment  +---------+------------------+-----+----------------+-------+  Brachial  137                      triphasic                 +---------+------------------+-----+----------------+-------+  PTA       163                1.19  biphasic                  +---------+------------------+-----+----------------+-------+  DP        154                1.12  triphasic                 +---------+------------------+-----+----------------+-------+  Great Toe 186                1.36  Non compressible          +---------+------------------+-----+----------------+-------+  Right Doppler Findings: +--------+--------+-----+---------+--------+  Site     Pressure Index Doppler   Comments  +--------+--------+-----+---------+--------+  Brachial 137            triphasic           +--------+--------+-----+---------+--------+  Radial                  triphasic           +--------+--------+-----+---------+--------+  Ulnar                   triphasic           +--------+--------+-----+---------+--------+  Left Doppler Findings: +--------+--------+-----+---------+--------+  Site     Pressure Index Doppler   Comments  +--------+--------+-----+---------+--------+  Brachial 137            triphasic           +--------+--------+-----+---------+--------+  Radial                  triphasic           +--------+--------+-----+---------+--------+  Ulnar                   triphasic            +--------+--------+-----+---------+--------+  Summary: Right Carotid: Velocities in the right ICA are consistent with a 1-39% stenosis. Left Carotid: Velocities in the left ICA are consistent with a 1-39% stenosis. Vertebrals:  Bilateral vertebral arteries demonstrate antegrade flow. Subclavians: Normal flow hemodynamics were seen in bilateral subclavian              arteries. Right ABI: Resting right ankle-brachial index indicates noncompressible right lower extremity arteries. The right toe-brachial index is non-compressible. Left ABI: Resting left ankle-brachial index is within normal range. No evidence of significant left lower extremity arterial disease. The left toe-brachial index is non-compressible. Right Upper Extremity: Doppler waveforms remain within normal limits with right radial compression. Doppler waveforms remain within normal limits with right ulnar compression. Left Upper Extremity: Doppler waveforms remain within normal limits with left radial compression. Doppler waveforms remain within normal limits with left ulnar compression.  Electronically signed by Jamelle Haring on 03/28/2021 at 3:56:48 PM.    Final     Cardiac Studies   Echo: IMPRESSIONS     1.  Left ventricular ejection fraction, by estimation, is 20 to 25%. The  left ventricle has severely decreased function. The left ventricle  demonstrates regional wall motion abnormalities (see scoring  diagram/findings for description). The left  ventricular internal cavity size was mildly dilated. Left ventricular  diastolic parameters are consistent with Grade I diastolic dysfunction  (impaired relaxation).   2. Right ventricular systolic function is normal. The right ventricular  size is normal.   3. The mitral valve is normal in structure. No evidence of mitral valve  regurgitation. No evidence of mitral stenosis.   4. The aortic valve is normal in structure. Aortic valve regurgitation is  not visualized. Aortic valve sclerosis  is present, with no evidence of  aortic valve stenosis.   5. The inferior vena cava is normal in size with greater than 50%  respiratory variability, suggesting right atrial pressure of 3 mmHg.   Comparison(s): No prior Echocardiogram.   Cardiac cath: LEFT HEART CATH AND CORONARY ANGIOGRAPHY   Conclusion      Prox RCA to Dist RCA lesion is 100% stenosed.  Faint left-to-right collaterals to the PDA system   Prox Cx lesion is 99% stenosed with 99% stenosed side branch in 1st Mrg. -> 1st Mrg lesion is 80% stenosed.   Beyond 1st Mrg: Prox Cx to Dist Cx lesion is 100% stenosed with 100% stenosed side branch in 2nd Mrg. (2nd Mrg fills via faint left-left collaterals distally)   Ramus lesion is 95% stenosed.  (Small caliber vessel)   Prox LAD to Mid LAD lesion is 55% stenosed.  Prior to 1st Diag   Mid LAD to Dist LAD lesion is 75% stenosed - beyond 1st Diag   Dist (Apical) LAD lesion is 90% stenosed with 90% stenosed side branch in 2nd Diag.   LV end diastolic pressure is normal.   There is no aortic valve stenosis.   Severely Reduced LVEF by Echocardiogram   SUMMARY Severe Multivessel Occlusive CAD: Large RCA 100% proximal CTO (heavily calcified) -faint left-to-right collaterals to the PDA LCx occluded after OM1 with faint collaterals filling OM2; OM1 has bifurcation 99% stenosis followed by 80% stenosis Small caliber bifurcating Ramus Intermedius with ostial proximal 95% stenosis (likely too small for revascularization) Moderate caliber LAD with diffuse segmental eccentric calcified 55 to 60% proximal stenosis prior to major D1 and mid vessel.  Beyond D1 75% segmental stenosis, and 95% apical Normal LVEDP with severely reduced EF of 20 to 25% by echocardiogram-WITH INFERIOR WALL AKINESIS and ANTERIOR HYPOKINESIS   RECOMMENDATIONS Admit to inpatient unit, restart IV heparin CVTS consultation with reduced EF and multivessel disease in a diabetic. Images reviewed with Dr. [place] Convert  from Lopressor to carvedilol, as blood pressure tolerates start initiating GUIDELINE DIRECTED MEDICAL THERAPY for ISCHEMIC CARDIOMYOPATHY/CAD -> Entresto +/- Spironolactone if possible prior to d/c,, continue SGLT2-I High-dose high intensity statin, and aggressive glycemic control     Bryan Lemma, MD   Diagnostic Dominance: Right Intervention   Patient Profile     46 y.o. male with long history of HTN and IDDM presents with NSTEMI and near syncope  Assessment & Plan    Non-STEMI -Classic story.    EKG with inferior Q waves. High-sensitivity troponin 196> 263> 288.  On ASA, beta blocker, high dose statin. IV heparin  Cardiac cath with severe 3 vessel disease. Poorly suited for PCI   Underwent CABG yesterday with LIMA to LAD, left radial to PDA, SVG to diagonal, and sequential SVG to ramus and OM Ecg today is  stable. On Milrinone and IV Ntg (for radial graft).      Echo with poor EF 25%.  Recommend starting Plavix along with ASA when OK with surgery.  2. Acute systolic CHF. EF 25% due to ischemic CM. Plan  starting  Entresto once off milrinone. Continue beta blocker  and [place]. Consider aldactone prior to DC. Will follow closely.  After revascularization and optimization of medical therapy repeat Echo in 2-3 months.     3. Syncope. Had VT/VF arrest and syncope. Now on amiodarone. Repleted potassium and magnesium. Will need Life vest on discharge with repeat assessment of EF in 2-3 months. If EF remains low may need ICD.  4.  Diabetes mellitus - hemoglobin A1c 8.2%. on insulin -Sliding scale insulin while admitted   5.  HTN - on beta blocker and losartan  6. HLD goal LDL < 55. Now on high dose statin.     For questions or updates, please contact [place] Please consult [email or web address] for contact info under        Signed, Nidya Bouyer [place], MD  03/30/2021, 7:41 AM

## 2021-03-30 NOTE — Op Note (Signed)
Gene Weeks, Gene Weeks MEDICAL RECORD NO: [id number] ACCOUNT NO: [id number] DATE OF BIRTH: 05/10/1975 FACILITY: [facility] LOCATION: [facility] PHYSICIAN: Salvatore Decent. Dorris Fetch, MD  Operative Report   DATE OF PROCEDURE: 03/29/2021  PREPROCEDURE DIAGNOSIS:  Severe 3-vessel coronary artery disease with ischemic cardiomyopathy.  POSTOPERATIVE DIAGNOSIS:  Severe 3-vessel coronary artery disease with ischemic cardiomyopathy.  PROCEDURE:   Median sternotomy, extracorporeal circulation,  Coronary artery bypass grafting x 5  Left internal mammary artery to LAD,  Left radial artery to posterior descending,  Saphenous vein graft to first diagonal,  Sequential saphenous vein graft to ramus intermedius and obtuse marginal Endoscopic vein harvest right thigh,  Open left radial artery harvest.  SURGEON:  Salvatore Decent. Dorris Fetch, MD  ASSISTANT:  Doree Fudge, PA and Jillyn Hidden, [place]  ANESTHESIA:  General.  FINDINGS:  Transesophageal echocardiography showed severe left ventricular dysfunction, no significant valvular pathology.  Some improvement of wall motion post-bypass. Diagonal fair quality, remaining targets good quality at site of anastomosis, but all vessels diffusely diseased.  Good quality conduits.  CLINICAL NOTE:  Gene Weeks is a 46 year old man who had presented with a near syncopal episode and ruled in for a non-ST elevation MI.  At catheterization, he was found to have severe 3-vessel disease.  Echocardiogram showed an ejection fraction of 20%  to 25%.  In the post-catheterization holding area, he had an episode of ventricular tachycardia requiring CPR.  He stabilized and then had no further significant arrhythmias.  He was advised to undergo coronary bypass grafting.  The indications, risks,  benefits, and alternatives were discussed in detail with the patient.  He understood and accepted the risks and agreed to proceed.  OPERATIVE NOTE:  Gene Weeks was brought to the preoperative holding  area on 03/29/2021.  Anesthesia placed a Swan-Ganz catheter and an arterial blood pressure monitoring line.  He was taken to the operating room and anesthetized and intubated.  Dr. Val Eagle performed transesophageal echocardiography.  Please refer to his separate note for full details of the procedure.  He did have ischemic cardiomyopathy with global hypokinesis.  There was no significant valvular pathology.  The chest,  abdomen, legs and left arm were prepped and draped in the usual sterile fashion.  A timeout was performed.  The conduits were harvested simultaneously.  Donielle Zimmerman harvested the greater saphenous vein from the right thigh endoscopically. Jillyn Hidden made an incision over the volar aspect of the left wrist.  A short  segment of the radial artery was dissected out. With occlusion of the radial artery, there was a good pulse ox signal in the thumb and a good Doppler signal in the palmar arch.  The incision was extended to just below the antecubital fossa and the left  radial artery was harvested using Harmonic scalpel.  While the radial and saphenous vein were being harvested, I performed a median sternotomy and harvested the left internal mammary artery using standard technique.  2000 units of heparin was  administered during the vessel harvest.  The remainder of the full heparin dose was given prior to opening the pericardium.  It should be noted that the left arm incision was closed and the arm was tucked to the patient's side prior to going on cardiopulmonary bypass.  All the conduits were good quality vessels. The pericardium was opened.  The ascending aorta was inspected.  It was of normal caliber with no evidence of atherosclerotic disease.  After confirming adequate anticoagulation with ACT measurement, the aorta  was cannulated via concentric 2-0  Ethibond pledgeted pursestring sutures.  A dual stage venous cannula was placed via a pursestring suture in the  right atrial appendage.  Cardiopulmonary bypass was initiated.  Flows were maintained per protocol.  The  patient was cooled to 32 degrees Celsius.  The coronary arteries were inspected and anastomotic sites were chosen.  The conduits were inspected and cut to length.  A foam pad was placed in the pericardium to insulate the heart.  A temperature probe was  placed in the myocardial septum.  A retrograde cardioplegia cannula was placed via a pursestring suture in the right atrium and directed into the coronary sinus and an antegrade cardioplegia cannula was placed into the ascending aorta.  The aorta was cross clamped.  The left ventricle was emptied via the aortic root vent.  Cardiac arrest then was achieved with a combination of cold antegrade and retrograde blood cardioplegia and topical iced saline.  An initial 500 mL of cardioplegia  was given antegrade.  There was a rapid diastolic arrest.  An additional liter of cardioplegia was given retrograde.  There was septal cooling to 11 degrees Celsius.  The distal end of the radial artery was bevelled.  It was anastomosed end-to-side to the posterior descending branch of the right coronary.  The PDA arose just over the acute margin.  It was a 1.5 mm vessel at the site of the anastomosis.  There was heavy  disease proximally, but a probe did pass easily distally. The end-to-side anastomosis was performed with a running 8-0 Prolene suture.  At the completion of the anastomosis, cardioplegia was administered down the graft to assess flow and hemostasis, both were good.  Additional cardioplegia was also administered both antegrade and retrograde as needed to maintain septal cooling. A reversed saphenous vein graft then was placed end-to-side to the first diagonal  branch of the LAD.  This was a 1.5 mm fair quality vessel, it was diffusely diseased.  The vein was anastomosed end-to-side with a running 7-0 Prolene suture.  With cardioplegia administration, there  was good flow and good hemostasis.  A reversed saphenous vein graft was placed sequentially to the ramus intermedius and obtuse marginal.  These vessels both were superficially intramyocardial.  Both accepted a 1.5 mm probe distally.  A side-to-side anastomosis was performed to the ramus  intermedius and an end-to-side to OM.  OM was a better quality vessel than the ramus. The anastomoses were both probed proximally and distally at their completion.  Cardioplegia was administered down the graft and there was good flow and good hemostasis.  After giving additional cardioplegia, the left internal mammary artery was brought through a window in the pericardium.  The distal end was bevelled.  It was anastomosed end-to-side to the distal LAD.  The LAD was a better quality vessel than it appeared on catheterization.  It did have diffuse disease, but a 1.5 mm probe passed easily proximally and distally.  The mammary was a good quality vessel.  The end-to-side anastomosis was performed with a running 8-0 Prolene suture.  At the completion of  the anastomosis, the bulldog clamp was briefly removed to inspect for hemostasis.  Rapid septal rewarming was noted.  The bulldog clamp was replaced and the mammary pedicle was tacked to the epicardial surface of the heart with 6-0 Prolene sutures.  Additional cardioplegia was administered.  The radial and vein grafts were cut to length.  The proximal anastomoses were performed to 4.5 mm punch aortotomies. A running 7-0 Prolene suture was used  for the radial and 6-0 Prolene sutures were used for the  veins.  After completion of the final proximal anastomosis, the patient was placed in Trendelenburg position.  Lidocaine was administered.  The aortic root was de-aired and the aortic crossclamp was removed.  Total crossclamp time was 103 minutes.  The  patient required a single defibrillation with 10 joules and then was in sinus rhythm thereafter.  While rewarming was completed,  all proximal and distal anastomoses were inspected for hemostasis.  Epicardial pacing wires were placed on the right  ventricle and right atrium.  The patient was loaded with milrinone and then an infusion was initiated.  A low dose epinephrine infusion was initiated as well.  When the patient had rewarmed to a core temperature of 37 degrees Celsius, he was weaned  from cardiopulmonary bypass on the first attempt.  The total bypass time was 139 minutes.  Post-bypass transesophageal echocardiography showed some improvement in left ventricular wall motion and otherwise was unchanged from the prebypass study.  A test dose of protamine was administered and was well tolerated.  The atrial and aortic cannulae  were removed.  The remainder of the protamine was administered without incident.  The chest was copiously irrigated with warm saline.  Hemostasis was achieved.  Left pleural and mediastinal chest tubes were placed through separate subcostal incisions.   The sternum was closed with a combination of single and double heavy gauge stainless steel wires.  There was a transient drop in cardiac output with chest closure, but that improved with volume resuscitation.  There were no other changes noted with  closure of the sternum.  The pectoralis fascia, subcutaneous tissue and skin were closed in standard fashion.  There was a missing needle from an 8-0 Prolene suture at the completion of the procedure.  An x-ray was obtained and there was no evidence of  retained foreign body.  The patient then was transported from the operating room to the surgical intensive care unit, intubated and in good condition.  Experienced assistance was necessary for this case due to complexity. Doree Fudge and Jillyn Hidden both provided assistance independently harvesting conduits, providing exposure, retraction of delicate tissue, suctioning and suture management  during the anastomoses and wound closure of the arm and  leg, respectively.   VAI D: 03/29/2021 8:17:36 pm T: 03/30/2021 12:03:00 am  JOB: [id number]/ [id number]

## 2021-03-30 NOTE — Progress Notes (Signed)
1 Day Post-Op Procedure(s) (LRB): CORONARY ARTERY BYPASS GRAFTING (CABG) TIMES FIVE, USING LEFT INTERNAL MAMMARY ARTERY, LEFT RADIAL ARTERY, AND ENDOSCOPICALLY HARVESTED RIGHT GREATER SAPHENOUS VEIN (N/A) RADIAL ARTERY HARVEST (Left) TRANSESOPHAGEAL ECHOCARDIOGRAM (TEE) (N/A) ENDOVEIN HARVEST OF GREATER SAPHENOUS VEIN (Right) Subjective: Some incisional pain, hiccups earlier  Objective: Vital signs in last 24 hours: Temp:  [98.1 F (36.7 C)-100.9 F (38.3 C)] 100 F (37.8 C) (02/03 0700) Pulse Rate:  [72-100] 89 (02/03 0700) Cardiac Rhythm: Normal sinus rhythm (02/03 0400) Resp:  [0-32] 28 (02/03 0700) BP: (72-148)/(36-95) 72/36 (02/03 0600) SpO2:  [94 %-100 %] 98 % (02/03 0700) Arterial Line BP: (85-141)/(34-89) 107/53 (02/03 0700) FiO2 (%):  [40 %-50 %] 40 % (02/02 2225) Weight:  [127.4 kg] 127.4 kg (02/03 0500)  Hemodynamic parameters for last 24 hours: PAP: (14-71)/(6-56) 19/15 CO:  [4.5 L/min-7 L/min] 6.3 L/min CI:  [1.8 L/min/m2-2.8 L/min/m2] 2.5 L/min/m2  Intake/Output from previous day: 02/02 0701 - 02/03 0700 In: 4303.9 [I.V.:3203; Blood:600; IV Piggyback:500.9] Out: 1726 [Urine:1410; Chest Tube:316] Intake/Output this shift: No intake/output data recorded.  General appearance: alert, cooperative, and no distress Neurologic: intact Heart: regular rate and rhythm Lungs: diminished breath sounds bibasilar Abdomen: normal findings: soft, non-tender  Lab Results: Recent Labs    03/29/21 1858 03/29/21 1904 03/30/21 0015 03/30/21 0429  WBC 9.3  --   --  11.2*  HGB 12.7*   < > 13.3 12.7*  HCT 36.5*   < > 39.0 36.7*  PLT 132*  --   --  158   < > = values in this interval not displayed.   BMET:  Recent Labs    03/29/21 0454 03/29/21 1245 03/29/21 1736 03/29/21 1904 03/30/21 0015 03/30/21 0429  NA 137   < > 139   < > 136 134*  K 3.8   < > 3.8   < > 5.1 4.5  CL 104   < > 103  --   --  108  CO2 23  --   --   --   --  20*  GLUCOSE 170*   < > 117*  --    --  151*  BUN 10   < > 7  --   --  9  CREATININE 0.95   < > 0.70  --   --  1.13  CALCIUM 9.0  --   --   --   --  8.3*   < > = values in this interval not displayed.    PT/INR:  Recent Labs    03/29/21 1858  LABPROT 16.6*  INR 1.3*   ABG    Component Value Date/Time   PHART 7.324 (L) 03/30/2021 0015   HCO3 21.0 03/30/2021 0015   TCO2 22 03/30/2021 0015   ACIDBASEDEF 5.0 (H) 03/30/2021 0015   O2SAT 99.0 03/30/2021 0015   CBG (last 3)  Recent Labs    03/30/21 0616 03/30/21 0657 03/30/21 0722  GLUCAP 139* 143* 134*    Assessment/Plan: S/P Procedure(s) (LRB): CORONARY ARTERY BYPASS GRAFTING (CABG) TIMES FIVE, USING LEFT INTERNAL MAMMARY ARTERY, LEFT RADIAL ARTERY, AND ENDOSCOPICALLY HARVESTED RIGHT GREATER SAPHENOUS VEIN (N/A) RADIAL ARTERY HARVEST (Left) TRANSESOPHAGEAL ECHOCARDIOGRAM (TEE) (N/A) ENDOVEIN HARVEST OF GREATER SAPHENOUS VEIN (Right) POD # 1 NEURO- intact CV- good cardiac output on milrinone + epi  Decrease milrinone to 0.125, wean epi and neo  Dc Swan and follow co-ox  ASA, statin, beta blocker, restart ACE-I prior to dc  Add Plavix prior to dc  Pacing to suppress  ectopy, resume amiodarone RESP- IS for basilar atelectasis RENAL- creatinine normal  Volume overload- diurese as BP allows ENDO- CBG controlled with insulin drip  Transition to levemir + SSI  Restart PO meds tomorrow GI- diet advance as tolerated Anemia secondary to ABL- mild, follow Dc chest tubes SCD + enoxaparin for DVT prophylaxis Mobilize   LOS: 4 days    Melrose Nakayama 03/30/2021

## 2021-03-30 NOTE — Progress Notes (Signed)
RT attempted aline insertion x2 without success. CCM has been made aware.

## 2021-03-30 NOTE — Progress Notes (Signed)
EVENING ROUNDS NOTE :     [address]       [place] [address]             [phone]                 1 Day Post-Op Procedure(s) (LRB): CORONARY ARTERY BYPASS GRAFTING (CABG) TIMES FIVE, USING LEFT INTERNAL MAMMARY ARTERY, LEFT RADIAL ARTERY, AND ENDOSCOPICALLY HARVESTED RIGHT GREATER SAPHENOUS VEIN (N/A) RADIAL ARTERY HARVEST (Left) TRANSESOPHAGEAL ECHOCARDIOGRAM (TEE) (N/A) ENDOVEIN HARVEST OF GREATER SAPHENOUS VEIN (Right)   Total Length of Stay:  LOS: 4 days  Events:   No events Lost A line.  Unable to thread new one. On neo, milr and epi Up to chair, mentating well Will wean neo based on BP cuff    BP (!) 89/71    Pulse 89    Temp 98.1 F (36.7 C) (Oral)    Resp (!) 24    Ht 6\' 5"  (1.956 m)    Wt 127.4 kg    SpO2 98%    BMI 33.31 kg/m   PAP: (14-71)/(6-56) 24/21 CO:  [4.5 L/min-7 L/min] 6.4 L/min CI:  [1.8 L/min/m2-2.8 L/min/m2] 2.5 L/min/m2  Vent Mode: PSV FiO2 (%):  [40 %-50 %] 40 % Set Rate:  [4 bmp-12 bmp] 4 bmp Vt Set:  [710 mL] 710 mL PEEP:  [5 cmH20] 5 cmH20 Pressure Support:  [10 cmH20] 10 cmH20 Plateau Pressure:  [16 cmH20-17 cmH20] 16 cmH20   sodium chloride 20 mL/hr at 03/29/21 2000   sodium chloride     sodium chloride 20 mL/hr at 03/29/21 1845   amiodarone Stopped (03/30/21 1237)   dexmedetomidine (PRECEDEX) IV infusion Stopped (03/29/21 2135)   epinephrine 1 mcg/min (03/29/21 2100)   lactated ringers     lactated ringers 20 mL/hr at 03/30/21 1600   lactated ringers Stopped (03/30/21 1316)   milrinone 0.125 mcg/kg/min (03/30/21 1600)   phenylephrine (NEO-SYNEPHRINE) Adult infusion 25 mcg/min (03/30/21 1600)    I/O last 3 completed shifts: In: 4940.2 [P.O.:240; I.V.:3599.3; Blood:600; IV Piggyback:500.9] Out: 2226 [Urine:1910; Chest Tube:316]   CBC Latest Ref Rng & Units 03/30/2021 03/30/2021 03/29/2021  WBC 4.0 - 10.5 K/uL 11.2(H) - -  Hemoglobin 13.0 - 17.0 g/dL 12.7(L) 13.3 13.6  Hematocrit 39.0 - 52.0 % 36.7(L) 39.0 40.0   Platelets 150 - 400 K/uL 158 - -    BMP Latest Ref Rng & Units 03/30/2021 03/30/2021 03/29/2021  Glucose 70 - 99 mg/dL 151(H) - -  BUN 6 - 20 mg/dL 9 - -  Creatinine 0.61 - 1.24 mg/dL 1.13 - -  Sodium 135 - 145 mmol/L 134(L) 136 137  Potassium 3.5 - 5.1 mmol/L 4.5 5.1 4.9  Chloride 98 - 111 mmol/L 108 - -  CO2 22 - 32 mmol/L 20(L) - -  Calcium 8.9 - 10.3 mg/dL 8.3(L) - -    ABG    Component Value Date/Time   PHART 7.324 (L) 03/30/2021 0015   PCO2ART 40.8 03/30/2021 0015   PO2ART 161 (H) 03/30/2021 0015   HCO3 21.0 03/30/2021 0015   TCO2 22 03/30/2021 0015   ACIDBASEDEF 5.0 (H) 03/30/2021 0015   O2SAT 99.0 03/30/2021 0015       Melodie Bouillon, MD 03/30/2021 4:23 PM

## 2021-03-31 ENCOUNTER — Inpatient Hospital Stay (HOSPITAL_COMMUNITY): Payer: BC Managed Care – PPO

## 2021-03-31 DIAGNOSIS — I5020 Unspecified systolic (congestive) heart failure: Secondary | ICD-10-CM | POA: Diagnosis not present

## 2021-03-31 DIAGNOSIS — I214 Non-ST elevation (NSTEMI) myocardial infarction: Secondary | ICD-10-CM | POA: Diagnosis not present

## 2021-03-31 DIAGNOSIS — I25110 Atherosclerotic heart disease of native coronary artery with unstable angina pectoris: Secondary | ICD-10-CM

## 2021-03-31 LAB — CBC
HCT: 33.3 % — ABNORMAL LOW (ref 39.0–52.0)
Hemoglobin: 11.1 g/dL — ABNORMAL LOW (ref 13.0–17.0)
MCH: 31.7 pg (ref 26.0–34.0)
MCHC: 33.3 g/dL (ref 30.0–36.0)
MCV: 95.1 fL (ref 80.0–100.0)
Platelets: 127 10*3/uL — ABNORMAL LOW (ref 150–400)
RBC: 3.5 MIL/uL — ABNORMAL LOW (ref 4.22–5.81)
RDW: 13.2 % (ref 11.5–15.5)
WBC: 12 10*3/uL — ABNORMAL HIGH (ref 4.0–10.5)
nRBC: 0 % (ref 0.0–0.2)

## 2021-03-31 LAB — BASIC METABOLIC PANEL
Anion gap: 6 (ref 5–15)
BUN: 14 mg/dL (ref 6–20)
CO2: 24 mmol/L (ref 22–32)
Calcium: 8.6 mg/dL — ABNORMAL LOW (ref 8.9–10.3)
Chloride: 99 mmol/L (ref 98–111)
Creatinine, Ser: 1.21 mg/dL (ref 0.61–1.24)
GFR, Estimated: >60 mL/min (ref >60)
Glucose, Bld: 259 mg/dL — ABNORMAL HIGH (ref 70–99)
Potassium: 4.7 mmol/L (ref 3.5–5.1)
Sodium: 129 mmol/L — ABNORMAL LOW (ref 135–145)

## 2021-03-31 LAB — COOXEMETRY PANEL
Carboxyhemoglobin: 1.2 % (ref 0.5–1.5)
Methemoglobin: 1.1 % (ref 0.0–1.5)
O2 Saturation: 78.6 %
Total hemoglobin: 11.4 g/dL — ABNORMAL LOW (ref 12.0–16.0)

## 2021-03-31 LAB — GLUCOSE, CAPILLARY
Glucose-Capillary: 168 mg/dL — ABNORMAL HIGH (ref 70–99)
Glucose-Capillary: 187 mg/dL — ABNORMAL HIGH (ref 70–99)
Glucose-Capillary: 189 mg/dL — ABNORMAL HIGH (ref 70–99)
Glucose-Capillary: 219 mg/dL — ABNORMAL HIGH (ref 70–99)
Glucose-Capillary: 228 mg/dL — ABNORMAL HIGH (ref 70–99)
Glucose-Capillary: 242 mg/dL — ABNORMAL HIGH (ref 70–99)

## 2021-03-31 MED ORDER — INSULIN DETEMIR 100 UNIT/ML ~~LOC~~ SOLN
35.0000 [IU] | Freq: Two times a day (BID) | SUBCUTANEOUS | Status: DC
  Administered 2021-03-31 – 2021-04-02 (×5): 35 [IU] via SUBCUTANEOUS
  Filled 2021-03-31 (×7): qty 0.35

## 2021-03-31 MED ORDER — AMIODARONE IV BOLUS ONLY 150 MG/100ML
150.0000 mg | Freq: Once | INTRAVENOUS | Status: AC
  Administered 2021-03-31: 150 mg via INTRAVENOUS
  Filled 2021-03-31: qty 100

## 2021-03-31 MED ORDER — AMIODARONE LOAD VIA INFUSION
150.0000 mg | Freq: Once | INTRAVENOUS | Status: DC
  Filled 2021-03-31: qty 83.34

## 2021-03-31 MED ORDER — CARVEDILOL 3.125 MG PO TABS
3.1250 mg | ORAL_TABLET | Freq: Two times a day (BID) | ORAL | Status: DC
  Administered 2021-04-01 – 2021-04-07 (×13): 3.125 mg via ORAL
  Filled 2021-03-31 (×13): qty 1

## 2021-03-31 MED ORDER — AMIODARONE IV BOLUS ONLY 150 MG/100ML
150.0000 mg | Freq: Once | INTRAVENOUS | Status: DC
Start: 2021-04-01 — End: 2021-04-01

## 2021-03-31 NOTE — Progress Notes (Signed)
[address]       [place] [address]             [phone]                 2 Days Post-Op Procedure(s) (LRB): CORONARY ARTERY BYPASS GRAFTING (CABG) TIMES FIVE, USING LEFT INTERNAL MAMMARY ARTERY, LEFT RADIAL ARTERY, AND ENDOSCOPICALLY HARVESTED RIGHT GREATER SAPHENOUS VEIN (N/A) RADIAL ARTERY HARVEST (Left) TRANSESOPHAGEAL ECHOCARDIOGRAM (TEE) (N/A) ENDOVEIN HARVEST OF GREATER SAPHENOUS VEIN (Right)   Events: No events overnight Ambulating this am _______________________________________________________________ Vitals: BP (!) 105/58    Pulse 88    Temp 98.2 F (36.8 C) (Oral)    Resp 18    Ht 6\' 5"  (1.956 m)    Wt 127.4 kg    SpO2 99%    BMI 33.31 kg/m  Filed Weights   03/27/21 1600 03/28/21 2100 03/30/21 0500  Weight: 122 kg 120.6 kg 127.4 kg     - Neuro: alert NAD  - Cardiovascular: Sinus  Drips: epi 2, neo 20.  Milr 0.125 PAP: (19-24)/(15-21) 24/21  - Pulm: EWOB    ABG    Component Value Date/Time   PHART 7.324 (L) 03/30/2021 0015   PCO2ART 40.8 03/30/2021 0015   PO2ART 161 (H) 03/30/2021 0015   HCO3 21.0 03/30/2021 0015   TCO2 22 03/30/2021 0015   ACIDBASEDEF 5.0 (H) 03/30/2021 0015   O2SAT 78.6 03/31/2021 0449    - Abd: ND - Extremity: warm  .Intake/Output      02/03 0701 02/04 0700 02/04 0701 02/05 0700   P.O. 360    I.V. (mL/kg) 1490.7 (11.7) 301.1 (2.4)   Blood     Other  0   IV Piggyback 399.9    Total Intake(mL/kg) 2250.7 (17.7) 301.1 (2.4)   Urine (mL/kg/hr) 2295 (0.8) 125 (0.4)   Stool     Chest Tube 20    Total Output 2315 125   Net -64.3 +176.1           _______________________________________________________________ Labs: CBC Latest Ref Rng & Units 03/31/2021 03/30/2021 03/30/2021  WBC 4.0 - 10.5 K/uL 12.0(H) 12.0(H) 11.2(H)  Hemoglobin 13.0 - 17.0 g/dL 11.1(L) 12.5(L) 12.7(L)  Hematocrit 39.0 - 52.0 % 33.3(L) 37.1(L) 36.7(L)  Platelets 150 - 400 K/uL 127(L) 148(L) 158   CMP Latest Ref Rng & Units  03/31/2021 03/30/2021 03/30/2021  Glucose 70 - 99 mg/dL 259(H) 223(H) 151(H)  BUN 6 - 20 mg/dL 14 12 9   Creatinine 0.61 - 1.24 mg/dL 1.21 1.28(H) 1.13  Sodium 135 - 145 mmol/L 129(L) 131(L) 134(L)  Potassium 3.5 - 5.1 mmol/L 4.7 5.7(H) 4.5  Chloride 98 - 111 mmol/L 99 102 108  CO2 22 - 32 mmol/L 24 20(L) 20(L)  Calcium 8.9 - 10.3 mg/dL 8.6(L) 8.5(L) 8.3(L)  Total Protein 6.5 - 8.1 g/dL - - -  Total Bilirubin 0.3 - 1.2 mg/dL - - -  Alkaline Phos 38 - 126 U/L - - -  AST 15 - 41 U/L - - -  ALT 0 - 44 U/L - - -    CXR: PV congestion  _______________________________________________________________  Assessment and Plan: POD 2 s/p CABG.  Doing well  Neuro: pain controlled CV: switching to coreg once epi is off.  Weaning epi, and neo, will keep milr.  Pacer set to back up Pulm: continue pulm hyg Renal: creat stable.  diuresing GI: on diet Heme: stable ID: [email or web address] Endo: adjusting SSI Dispo: continue ICU care   Kechia Weeks  O Gene Weeks 03/31/2021 9:49 AM

## 2021-03-31 NOTE — Progress Notes (Addendum)
Progress Note  Patient Name: Gene Weeks Date of Encounter: 03/31/2021  Primary Cardiologist: Peter Martinique, MD  Subjective   Patient up ambulating in hall with nursing.  No chest pain or dizziness.  Inpatient Medications    Scheduled Meds:  acetaminophen  1,000 mg Oral Q6H   Or   acetaminophen (TYLENOL) oral liquid 160 mg/5 mL  1,000 mg Per Tube Q6H   aspirin EC  325 mg Oral Daily   Or   aspirin  324 mg Per Tube Daily   atorvastatin  80 mg Oral Daily   bisacodyl  10 mg Oral Daily   Or   bisacodyl  10 mg Rectal Daily   Chlorhexidine Gluconate Cloth  6 each Topical Q0600   docusate sodium  200 mg Oral Daily   enoxaparin (LOVENOX) injection  40 mg Subcutaneous QHS   furosemide  20 mg Intravenous BID   insulin aspart  0-24 Units Subcutaneous Q4H   insulin detemir  30 Units Subcutaneous BID   isosorbide mononitrate  30 mg Oral Daily   metoprolol tartrate  12.5 mg Oral BID   Or   metoprolol tartrate  12.5 mg Per Tube BID   pantoprazole  40 mg Oral Daily   sodium chloride flush  3 mL Intravenous Q12H   Continuous Infusions:  sodium chloride 20 mL/hr at 03/29/21 2000   sodium chloride     sodium chloride 20 mL/hr at 03/29/21 1845   amiodarone 30 mg/hr (03/31/21 0800)   dexmedetomidine (PRECEDEX) IV infusion Stopped (03/29/21 2135)   epinephrine 3 mcg/min (03/31/21 0600)   lactated ringers     lactated ringers 20 mL/hr at 03/31/21 0800   lactated ringers Stopped (03/30/21 1316)   milrinone 0.125 mcg/kg/min (03/31/21 0800)   phenylephrine (NEO-SYNEPHRINE) Adult infusion 20 mcg/min (03/31/21 0800)   PRN Meds: sodium chloride, dextrose, lactated ringers, metoprolol tartrate, morphine injection, ondansetron (ZOFRAN) IV, oxyCODONE, sodium chloride flush, traMADol   Vital Signs    Vitals:   03/31/21 0745 03/31/21 0800 03/31/21 0815 03/31/21 0830  BP: 122/62 113/71 124/69 109/60  Pulse: 89 89 89 85  Resp: (!) 21 (!) 24 19 17   Temp:      TempSrc:      SpO2: 100% 99%  100% 100%  Weight:      Height:        Intake/Output Summary (Last 24 hours) at 03/31/2021 0903 Last data filed at 03/31/2021 0800 Gross per 24 hour  Intake 2121.23 ml  Output 2295 ml  Net -173.77 ml   Filed Weights   03/27/21 1600 03/28/21 2100 03/30/21 0500  Weight: 122 kg 120.6 kg 127.4 kg    Telemetry    Sinus rhythm with atrial pacing, recurrent SVT, possibly atrial tachycardia/flutter.  Personally reviewed.  ECG    An ECG dated 03/30/2021 was personally reviewed today and demonstrated:  Sinus rhythm with old inferior infarct pattern.  Physical Exam   GEN: No acute distress.   Neck: No JVD. Cardiac: RRR, no gallop.  Respiratory: Nonlabored. Clear to auscultation bilaterally. MS: No edema; No deformity. Neuro:  Nonfocal.  Walking in hall with assistance.  Labs    Chemistry Recent Labs  Lab 03/26/21 0926 03/27/21 0422 03/27/21 1536 03/30/21 0429 03/30/21 1554 03/31/21 0449  NA 138 137   < > 134* 131* 129*  K 3.5 3.6   < > 4.5 5.7* 4.7  CL 103 105   < > 108 102 99  CO2 24 23   < > 20* 20* 24  GLUCOSE 240* 167*   < > 151* 223* 259*  BUN 12 13   < > 9 12 14   CREATININE 1.05 0.96   < > 1.13 1.28* 1.21  CALCIUM 9.3 9.0   < > 8.3* 8.5* 8.6*  PROT 7.1 6.5  --   --   --   --   ALBUMIN 3.9 3.4*  --   --   --   --   AST 68* 29  --   --   --   --   ALT 59* 43  --   --   --   --   ALKPHOS 66 61  --   --   --   --   BILITOT 0.7 0.8  --   --   --   --   GFRNONAA >60 >60   < > >60 >60 >60  ANIONGAP 11 9   < > 6 9 6    < > = values in this interval not displayed.     Hematology Recent Labs  Lab 03/30/21 0429 03/30/21 1554 03/31/21 0449  WBC 11.2* 12.0* 12.0*  RBC 3.94* 3.92* 3.50*  HGB 12.7* 12.5* 11.1*  HCT 36.7* 37.1* 33.3*  MCV 93.1 94.6 95.1  MCH 32.2 31.9 31.7  MCHC 34.6 33.7 33.3  RDW 12.5 12.9 13.2  PLT 158 148* 127*    Cardiac Enzymes Recent Labs  Lab 03/26/21 0926 03/26/21 1144 03/27/21 1809  TROPONINIHS 196* 263* 288*    DDimer Recent  Labs  Lab 03/26/21 1101  DDIMER 0.35     Radiology    DG Chest Port 1 View  Result Date: 03/31/2021 CLINICAL DATA:  Chest pain EXAM: PORTABLE CHEST 1 VIEW COMPARISON:  03/30/2021 FINDINGS: 0523 hours. Low volume film. Vascular congestion without overt edema. No focal consolidation or pleural effusion. Left chest tube is been removed without evidence for pneumothorax. Right IJ pulmonary catheter is been removed in the interval. The cardio pericardial silhouette is enlarged. Right IJ sheath tip overlies the proximal SVC level. Telemetry leads overlie the chest. IMPRESSION: 1. Interval removal of right IJ pulmonary catheter and left chest tube without evidence for pneumothorax. 2. Low volume film. Electronically Signed   By: Misty Stanley M.D.   On: 03/31/2021 08:14   DG Chest Port 1 View  Result Date: 03/30/2021 CLINICAL DATA:  Chest tube status post cardiac surgery. EXAM: PORTABLE CHEST 1 VIEW COMPARISON:  March 29, 2021. FINDINGS: Stable cardiomegaly. Endotracheal and nasogastric tubes have been removed. Stable position of right internal jugular Swan-Ganz catheter. Left-sided chest tube is noted without pneumothorax. Lungs are clear. Hypoinflation of the lungs is noted. Bony thorax is unremarkable. IMPRESSION: Endotracheal and nasogastric tubes have been removed. Stable position of left-sided chest tube without pneumothorax. Electronically Signed   By: Marijo Conception M.D.   On: 03/30/2021 08:15   DG Chest Port 1 View  Result Date: 03/29/2021 CLINICAL DATA:  Status post coronary bypass grafting EXAM: PORTABLE CHEST 1 VIEW COMPARISON:  Film from earlier in the same day FINDINGS: Cardiac shadow is enlarged but stable. Postsurgical changes are again seen. Swan-Ganz catheter is noted within the right pulmonary artery. Endotracheal tube and gastric catheter are noted in satisfactory position. Mediastinal drain and left thoracostomy catheter are seen as well. No pneumothorax is noted. Poor inspiratory  effort is noted without focal infiltrate. IMPRESSION: Tubes and lines as described above No acute abnormality noted. Electronically Signed   By: Inez Catalina M.D.   On: 03/29/2021 19:51  DG Chest Port 1 View  Result Date: 03/29/2021 CLINICAL DATA:  Missing needle EXAM: PORTABLE CHEST 1 VIEW COMPARISON:  Radiograph 03/26/2021 FINDINGS: There is a metallic tubular device overlying the mid chest oriented vertically. Endotracheal tube overlies the upper trachea. There is a pulmonary artery catheter overlying the pulmonary artery. Interval median sternotomy and postsurgical changes of CABG with circular radiopaque marker overlying the third from the top median sternotomy wire and mediastinal surgical clips noted. Mediastinal drain and left chest tube in place. No pneumothorax. No focal airspace consolidation. No pleural effusion. There is no evidence of retained radiopaque foreign body in the shape of a needle. IMPRESSION: No evidence of retained radiopaque foreign body in the shape of a needle. Postoperative chest without focal airspace consolidation, large effusion, or visible pneumothorax. These results were called by telephone at the time of interpretation on 03/29/2021 at 6:34 pm to provider in [place], who verbally acknowledged these results. Per discussion, the missing needle size is 8-0. Note this frontal chest radiograph likely has low sensitivity in the detection of a needle of this size. Electronically Signed   By: Maurine Simmering M.D.   On: 03/29/2021 18:36    Cardiac Studies   Echocardiogram 03/26/2021:  1. Left ventricular ejection fraction, by estimation, is 20 to 25%. The  left ventricle has severely decreased function. The left ventricle  demonstrates regional wall motion abnormalities (see scoring  diagram/findings for description). The left  ventricular internal cavity size was mildly dilated. Left ventricular  diastolic parameters are consistent with Grade I diastolic dysfunction  (impaired  relaxation).   2. Right ventricular systolic function is normal. The right ventricular  size is normal.   3. The mitral valve is normal in structure. No evidence of mitral valve  regurgitation. No evidence of mitral stenosis.   4. The aortic valve is normal in structure. Aortic valve regurgitation is  not visualized. Aortic valve sclerosis is present, with no evidence of  aortic valve stenosis.   5. The inferior vena cava is normal in size with greater than 50%  respiratory variability, suggesting right atrial pressure of 3 mmHg.   Assessment & Plan    1.  Multivessel CAD status post non-STEMI, now POD #2 status post CABG with LIMA to LAD, left radial to PDA, SVG to diagonal, and SVG to ramus and OM.  2.  HFrEF, ischemic cardiomyopathy with LVEF 20 to 25%, RV function normal.  3.  History of syncope with VT/VF.  4.  Recurrent SVT, atrial tachycardia/flutter.  Currently on aspirin, Lipitor, Lopressor, and IV amiodarone.  Weaning pressors and also on Imdur (radial graft).  Hemodynamically stable, tolerated ambulation this morning.  No adjustments to cardiac regimen as yet.  Not at a point to consider ANRI, Aldactone, or Wilder Glade - these can be added stepwise as he progresses depending on blood pressure room.  Would add Plavix when safe from a post-surgical perspective.  Can eventually discuss LifeVest defibrillator for discharge.  Signed, Rozann Lesches, MD  03/31/2021, 9:03 AM

## 2021-04-01 DIAGNOSIS — I214 Non-ST elevation (NSTEMI) myocardial infarction: Secondary | ICD-10-CM | POA: Diagnosis not present

## 2021-04-01 LAB — CBC WITH DIFFERENTIAL/PLATELET
Abs Immature Granulocytes: 0.05 10*3/uL (ref 0.00–0.07)
Basophils Absolute: 0 10*3/uL (ref 0.0–0.1)
Basophils Relative: 0 %
Eosinophils Absolute: 0.1 10*3/uL (ref 0.0–0.5)
Eosinophils Relative: 1 %
HCT: 31.8 % — ABNORMAL LOW (ref 39.0–52.0)
Hemoglobin: 10.5 g/dL — ABNORMAL LOW (ref 13.0–17.0)
Immature Granulocytes: 1 %
Lymphocytes Relative: 16 %
Lymphs Abs: 1.3 10*3/uL (ref 0.7–4.0)
MCH: 31.6 pg (ref 26.0–34.0)
MCHC: 33 g/dL (ref 30.0–36.0)
MCV: 95.8 fL (ref 80.0–100.0)
Monocytes Absolute: 0.9 10*3/uL (ref 0.1–1.0)
Monocytes Relative: 11 %
Neutro Abs: 5.9 10*3/uL (ref 1.7–7.7)
Neutrophils Relative %: 71 %
Platelets: 118 10*3/uL — ABNORMAL LOW (ref 150–400)
RBC: 3.32 MIL/uL — ABNORMAL LOW (ref 4.22–5.81)
RDW: 13.2 % (ref 11.5–15.5)
WBC: 8.2 10*3/uL (ref 4.0–10.5)
nRBC: 0 % (ref 0.0–0.2)

## 2021-04-01 LAB — GLUCOSE, CAPILLARY
Glucose-Capillary: 147 mg/dL — ABNORMAL HIGH (ref 70–99)
Glucose-Capillary: 144 mg/dL — ABNORMAL HIGH (ref 70–99)
Glucose-Capillary: 183 mg/dL — ABNORMAL HIGH (ref 70–99)
Glucose-Capillary: 227 mg/dL — ABNORMAL HIGH (ref 70–99)
Glucose-Capillary: 215 mg/dL — ABNORMAL HIGH (ref 70–99)

## 2021-04-01 LAB — COOXEMETRY PANEL
Carboxyhemoglobin: 1.1 % (ref 0.5–1.5)
Methemoglobin: 0.8 % (ref 0.0–1.5)
O2 Saturation: 62.3 %
Total hemoglobin: 10.6 g/dL — ABNORMAL LOW (ref 12.0–16.0)

## 2021-04-01 LAB — COMPREHENSIVE METABOLIC PANEL
ALT: 45 U/L — ABNORMAL HIGH (ref 0–44)
AST: 24 U/L (ref 15–41)
Albumin: 2.7 g/dL — ABNORMAL LOW (ref 3.5–5.0)
Alkaline Phosphatase: 54 U/L (ref 38–126)
Anion gap: 9 (ref 5–15)
BUN: 16 mg/dL (ref 6–20)
CO2: 25 mmol/L (ref 22–32)
Calcium: 8.6 mg/dL — ABNORMAL LOW (ref 8.9–10.3)
Chloride: 98 mmol/L (ref 98–111)
Creatinine, Ser: 1.22 mg/dL (ref 0.61–1.24)
GFR, Estimated: >60 mL/min (ref >60)
Glucose, Bld: 161 mg/dL — ABNORMAL HIGH (ref 70–99)
Potassium: 4.1 mmol/L (ref 3.5–5.1)
Sodium: 132 mmol/L — ABNORMAL LOW (ref 135–145)
Total Bilirubin: 0.5 mg/dL (ref 0.3–1.2)
Total Protein: 5.7 g/dL — ABNORMAL LOW (ref 6.5–8.1)

## 2021-04-01 MED ORDER — AMIODARONE LOAD VIA INFUSION
150.0000 mg | Freq: Once | INTRAVENOUS | Status: AC
  Administered 2021-04-01: 150 mg via INTRAVENOUS
  Filled 2021-04-01: qty 83.34

## 2021-04-01 NOTE — Progress Notes (Signed)
° °  Progress Note  Patient Name: Gene Weeks Date of Encounter: 04/01/2021  Primary Cardiologist: Peter Swaziland, MD  Reviewed hospital course since surrounding note yesterday.  Having increasing episodes of atrial tachycardia and atrial fibrillation.  He was switched from Lopressor to Coreg to start today by primary team and otherwise continues on IV amiodarone.  He is afebrile, heart rate in the 130s to 140s in atrial tachycardia, systolic pressure 90s to 110s.  Intake and output fairly even last 24 hours.  Co-oximetry 62, potassium 4.1, BUN 16, creatinine 1.22, hemoglobin 10.5, platelets 118.  Continue IV amiodarone and see how he does with Coreg, although not much blood pressure room for up titration.  If possible would try to get him weaned off of milrinone as this may help settle down rhythm as well.  He is off other pressors.  Signed, Nona Dell, MD  04/01/2021, 9:08 AM

## 2021-04-01 NOTE — Discharge Instructions (Signed)

## 2021-04-01 NOTE — Progress Notes (Signed)
° °   °  [address]       [place] [address]             [phone]                 3 Days Post-Op Procedure(s) (LRB): CORONARY ARTERY BYPASS GRAFTING (CABG) TIMES FIVE, USING LEFT INTERNAL MAMMARY ARTERY, LEFT RADIAL ARTERY, AND ENDOSCOPICALLY HARVESTED RIGHT GREATER SAPHENOUS VEIN (N/A) RADIAL ARTERY HARVEST (Left) TRANSESOPHAGEAL ECHOCARDIOGRAM (TEE) (N/A) ENDOVEIN HARVEST OF GREATER SAPHENOUS VEIN (Right)   Events: Afib overnight _______________________________________________________________ Vitals: BP 104/74    Pulse 84    Temp 98.4 F (36.9 C) (Oral)    Resp 18    Ht 6\' 5"  (1.956 m)    Wt 123 kg    SpO2 100%    BMI 32.16 kg/m  Filed Weights   03/28/21 2100 03/30/21 0500 04/01/21 0500  Weight: 120.6 kg 127.4 kg 123 kg     - Neuro: alert NAD  - Cardiovascular: Sinus  Drips:  Milr 0.125    - Pulm: EWOB    ABG    Component Value Date/Time   PHART 7.324 (L) 03/30/2021 0015   PCO2ART 40.8 03/30/2021 0015   PO2ART 161 (H) 03/30/2021 0015   HCO3 21.0 03/30/2021 0015   TCO2 22 03/30/2021 0015   ACIDBASEDEF 5.0 (H) 03/30/2021 0015   O2SAT 62.3 04/01/2021 0416    - Abd: ND - Extremity: warm  .Intake/Output      02/04 0701 02/05 0700 02/05 0701 02/06 0700   P.O.  240   I.V. (mL/kg) 1127.4 (9.2) 42.3 (0.3)   Other 0    IV Piggyback     Total Intake(mL/kg) 1127.4 (9.2) 282.3 (2.3)   Urine (mL/kg/hr) 1125 (0.4)    Chest Tube     Total Output 1125    Net +2.4 +282.3           _______________________________________________________________ Labs: CBC Latest Ref Rng & Units 04/01/2021 03/31/2021 03/30/2021  WBC 4.0 - 10.5 K/uL 8.2 12.0(H) 12.0(H)  Hemoglobin 13.0 - 17.0 g/dL 10.5(L) 11.1(L) 12.5(L)  Hematocrit 39.0 - 52.0 % 31.8(L) 33.3(L) 37.1(L)  Platelets 150 - 400 K/uL 118(L) 127(L) 148(L)   CMP Latest Ref Rng & Units 04/01/2021 03/31/2021 03/30/2021  Glucose 70 - 99 mg/dL 161(H) 259(H) 223(H)  BUN 6 - 20 mg/dL 16 14 12   Creatinine 0.61 -  1.24 mg/dL 1.22 1.21 1.28(H)  Sodium 135 - 145 mmol/L 132(L) 129(L) 131(L)  Potassium 3.5 - 5.1 mmol/L 4.1 4.7 5.7(H)  Chloride 98 - 111 mmol/L 98 99 102  CO2 22 - 32 mmol/L 25 24 20(L)  Calcium 8.9 - 10.3 mg/dL 8.6(L) 8.6(L) 8.5(L)  Total Protein 6.5 - 8.1 g/dL 5.7(L) - -  Total Bilirubin 0.3 - 1.2 mg/dL 0.5 - -  Alkaline Phos 38 - 126 U/L 54 - -  AST 15 - 41 U/L 24 - -  ALT 0 - 44 U/L 45(H) - -    CXR: -  _______________________________________________________________  Assessment and Plan: POD 3 s/p CABG.  Doing well  Neuro: pain controlled CV: will stop milr.  Will continue amio.  On coreg.  BP soft so will titrate tomorrow Pulm: continue pulm hyg Renal: creat stable.  diuresing GI: on diet Heme: stable ID: afebrile Endo: on SSI Dispo: continue ICU care   Velmer Broadfoot O Cosandra Plouffe 04/01/2021 10:20 AM

## 2021-04-01 NOTE — Progress Notes (Signed)
EVENING ROUNDS NOTE :     [address]       [place] [address]             [phone]                 3 Days Post-Op Procedure(s) (LRB): CORONARY ARTERY BYPASS GRAFTING (CABG) TIMES FIVE, USING LEFT INTERNAL MAMMARY ARTERY, LEFT RADIAL ARTERY, AND ENDOSCOPICALLY HARVESTED RIGHT GREATER SAPHENOUS VEIN (N/A) RADIAL ARTERY HARVEST (Left) TRANSESOPHAGEAL ECHOCARDIOGRAM (TEE) (N/A) ENDOVEIN HARVEST OF GREATER SAPHENOUS VEIN (Right)   Total Length of Stay:  LOS: 6 days  Events:   No events Rate controlled Continue amio     BP (!) 93/57 (BP Location: Right Arm)    Pulse 75    Temp 99 F (37.2 C) (Oral)    Resp 20    Ht 6\' 5"  (1.956 m)    Wt 123 kg    SpO2 94%    BMI 32.16 kg/m          sodium chloride Stopped (04/01/21 1150)   sodium chloride Stopped (04/01/21 1150)   sodium chloride 20 mL/hr at 03/29/21 1845   amiodarone 30 mg/hr (04/01/21 2000)   lactated ringers     lactated ringers Stopped (03/31/21 1736)   lactated ringers Stopped (03/30/21 1316)    I/O last 3 completed shifts: In: 1731.4 [P.O.:360; I.V.:1371.4] Out: 1800 [Urine:1800]   CBC Latest Ref Rng & Units 04/01/2021 03/31/2021 03/30/2021  WBC 4.0 - 10.5 K/uL 8.2 12.0(H) 12.0(H)  Hemoglobin 13.0 - 17.0 g/dL 10.5(L) 11.1(L) 12.5(L)  Hematocrit 39.0 - 52.0 % 31.8(L) 33.3(L) 37.1(L)  Platelets 150 - 400 K/uL 118(L) 127(L) 148(L)    BMP Latest Ref Rng & Units 04/01/2021 03/31/2021 03/30/2021  Glucose 70 - 99 mg/dL 161(H) 259(H) 223(H)  BUN 6 - 20 mg/dL 16 14 12   Creatinine 0.61 - 1.24 mg/dL 1.22 1.21 1.28(H)  Sodium 135 - 145 mmol/L 132(L) 129(L) 131(L)  Potassium 3.5 - 5.1 mmol/L 4.1 4.7 5.7(H)  Chloride 98 - 111 mmol/L 98 99 102  CO2 22 - 32 mmol/L 25 24 20(L)  Calcium 8.9 - 10.3 mg/dL 8.6(L) 8.6(L) 8.5(L)    ABG    Component Value Date/Time   PHART 7.324 (L) 03/30/2021 0015   PCO2ART 40.8 03/30/2021 0015   PO2ART 161 (H) 03/30/2021 0015   HCO3 21.0 03/30/2021 0015   TCO2 22 03/30/2021  0015   ACIDBASEDEF 5.0 (H) 03/30/2021 0015   O2SAT 62.3 04/01/2021 0416       Melodie Bouillon, MD 04/01/2021 8:13 PM

## 2021-04-02 DIAGNOSIS — I480 Paroxysmal atrial fibrillation: Secondary | ICD-10-CM

## 2021-04-02 LAB — CBC
HCT: 28.3 % — ABNORMAL LOW (ref 39.0–52.0)
Hemoglobin: 9.5 g/dL — ABNORMAL LOW (ref 13.0–17.0)
MCH: 31.9 pg (ref 26.0–34.0)
MCHC: 33.6 g/dL (ref 30.0–36.0)
MCV: 95 fL (ref 80.0–100.0)
Platelets: 137 10*3/uL — ABNORMAL LOW (ref 150–400)
RBC: 2.98 MIL/uL — ABNORMAL LOW (ref 4.22–5.81)
RDW: 13 % (ref 11.5–15.5)
WBC: 7.3 10*3/uL (ref 4.0–10.5)
nRBC: 0 % (ref 0.0–0.2)

## 2021-04-02 LAB — GLUCOSE, CAPILLARY
Glucose-Capillary: 101 mg/dL — ABNORMAL HIGH (ref 70–99)
Glucose-Capillary: 102 mg/dL — ABNORMAL HIGH (ref 70–99)
Glucose-Capillary: 161 mg/dL — ABNORMAL HIGH (ref 70–99)
Glucose-Capillary: 168 mg/dL — ABNORMAL HIGH (ref 70–99)
Glucose-Capillary: 211 mg/dL — ABNORMAL HIGH (ref 70–99)
Glucose-Capillary: 328 mg/dL — ABNORMAL HIGH (ref 70–99)

## 2021-04-02 LAB — BASIC METABOLIC PANEL
Anion gap: 11 (ref 5–15)
BUN: 18 mg/dL (ref 6–20)
CO2: 25 mmol/L (ref 22–32)
Calcium: 7.6 mg/dL — ABNORMAL LOW (ref 8.9–10.3)
Chloride: 94 mmol/L — ABNORMAL LOW (ref 98–111)
Creatinine, Ser: 1.09 mg/dL (ref 0.61–1.24)
GFR, Estimated: >60 mL/min (ref >60)
Glucose, Bld: 311 mg/dL — ABNORMAL HIGH (ref 70–99)
Potassium: 3.3 mmol/L — ABNORMAL LOW (ref 3.5–5.1)
Sodium: 130 mmol/L — ABNORMAL LOW (ref 135–145)

## 2021-04-02 LAB — COOXEMETRY PANEL
Carboxyhemoglobin: 1.3 % (ref 0.5–1.5)
Methemoglobin: 0.8 % (ref 0.0–1.5)
O2 Saturation: 64.3 %
Total hemoglobin: 9.6 g/dL — ABNORMAL LOW (ref 12.0–16.0)

## 2021-04-02 MED ORDER — OXYCODONE HCL 5 MG PO TABS
5.0000 mg | ORAL_TABLET | ORAL | Status: DC | PRN
  Administered 2021-04-02: 10 mg via ORAL
  Administered 2021-04-02 (×2): 5 mg via ORAL
  Administered 2021-04-02: 10 mg via ORAL
  Administered 2021-04-03 (×3): 5 mg via ORAL
  Filled 2021-04-02: qty 1
  Filled 2021-04-02 (×2): qty 2
  Filled 2021-04-02: qty 1
  Filled 2021-04-02 (×2): qty 2
  Filled 2021-04-02 (×2): qty 1

## 2021-04-02 MED ORDER — POTASSIUM CHLORIDE 10 MEQ/50ML IV SOLN
10.0000 meq | INTRAVENOUS | Status: AC
  Administered 2021-04-02 (×2): 10 meq via INTRAVENOUS
  Filled 2021-04-02: qty 50

## 2021-04-02 MED ORDER — AMIODARONE HCL 200 MG PO TABS
400.0000 mg | ORAL_TABLET | Freq: Two times a day (BID) | ORAL | Status: DC
  Administered 2021-04-02 – 2021-04-06 (×9): 400 mg via ORAL
  Filled 2021-04-02 (×9): qty 2

## 2021-04-02 MED ORDER — LACTULOSE 10 GM/15ML PO SOLN
20.0000 g | Freq: Once | ORAL | Status: AC
  Administered 2021-04-02: 20 g via ORAL
  Filled 2021-04-02: qty 30

## 2021-04-02 MED ORDER — INSULIN ASPART 100 UNIT/ML IJ SOLN
0.0000 [IU] | Freq: Three times a day (TID) | INTRAMUSCULAR | Status: DC
  Administered 2021-04-02: 3 [IU] via SUBCUTANEOUS
  Administered 2021-04-02: 2 [IU] via SUBCUTANEOUS
  Administered 2021-04-03: 5 [IU] via SUBCUTANEOUS
  Administered 2021-04-03 – 2021-04-04 (×4): 2 [IU] via SUBCUTANEOUS
  Administered 2021-04-05: 3 [IU] via SUBCUTANEOUS
  Administered 2021-04-05 – 2021-04-06 (×2): 1 [IU] via SUBCUTANEOUS
  Administered 2021-04-07: 2 [IU] via SUBCUTANEOUS

## 2021-04-02 MED ORDER — POTASSIUM CHLORIDE 10 MEQ/50ML IV SOLN
INTRAVENOUS | Status: AC
  Administered 2021-04-02: 10 meq via INTRAVENOUS
  Filled 2021-04-02: qty 50

## 2021-04-02 MED ORDER — POTASSIUM CHLORIDE 10 MEQ/50ML IV SOLN
INTRAVENOUS | Status: AC
  Filled 2021-04-02: qty 50

## 2021-04-02 MED ORDER — POTASSIUM CHLORIDE CRYS ER 20 MEQ PO TBCR
20.0000 meq | EXTENDED_RELEASE_TABLET | ORAL | Status: AC
  Administered 2021-04-02 (×3): 20 meq via ORAL
  Filled 2021-04-02 (×3): qty 1

## 2021-04-02 MED ORDER — INSULIN ASPART 100 UNIT/ML IJ SOLN
0.0000 [IU] | Freq: Every day | INTRAMUSCULAR | Status: DC
  Administered 2021-04-02: 4 [IU] via SUBCUTANEOUS

## 2021-04-02 MED FILL — Potassium Chloride Inj 2 mEq/ML: INTRAVENOUS | Qty: 40 | Status: AC

## 2021-04-02 MED FILL — Heparin Sodium (Porcine) Inj 1000 Unit/ML: Qty: 1000 | Status: AC

## 2021-04-02 MED FILL — Magnesium Sulfate Inj 50%: INTRAMUSCULAR | Qty: 10 | Status: AC

## 2021-04-02 MED FILL — Tranexamic Acid IV Soln 1000 MG/10ML (100 MG/ML): INTRAVENOUS | Qty: 3000 | Status: AC

## 2021-04-02 NOTE — Progress Notes (Signed)
4 Days Post-Op Procedure(s) (LRB): CORONARY ARTERY BYPASS GRAFTING (CABG) TIMES FIVE, USING LEFT INTERNAL MAMMARY ARTERY, LEFT RADIAL ARTERY, AND ENDOSCOPICALLY HARVESTED RIGHT GREATER SAPHENOUS VEIN (N/A) RADIAL ARTERY HARVEST (Left) TRANSESOPHAGEAL ECHOCARDIOGRAM (TEE) (N/A) ENDOVEIN HARVEST OF GREATER SAPHENOUS VEIN (Right) Subjective: C/o incisional pain Tried to use bathroom- + flatus, no BM  Objective: Vital signs in last 24 hours: Temp:  [98.3 F (36.8 C)-99.3 F (37.4 C)] 98.6 F (37 C) (02/06 0719) Pulse Rate:  [59-145] 69 (02/06 0600) Cardiac Rhythm: Normal sinus rhythm;Atrial fibrillation (02/06 0400) Resp:  [7-32] 32 (02/06 0600) BP: (89-117)/(50-80) 99/67 (02/06 0600) SpO2:  [69 %-100 %] 93 % (02/06 0600)  Hemodynamic parameters for last 24 hours:    Intake/Output from previous day: 02/05 0701 - 02/06 0700 In: 786.9 [P.O.:360; I.V.:426.9] Out: 1050 [Urine:1050] Intake/Output this shift: Total I/O In: 120 [P.O.:120] Out: -   General appearance: alert, cooperative, and no distress Neurologic: intact Heart: slightly irregular Lungs: diminished breath sounds bibasilar Abdomen: normal findings: soft, non-tender  Lab Results: Recent Labs    04/01/21 0418 04/02/21 0512  WBC 8.2 7.3  HGB 10.5* 9.5*  HCT 31.8* 28.3*  PLT 118* 137*   BMET:  Recent Labs    04/01/21 0418 04/02/21 0512  NA 132* 130*  K 4.1 3.3*  CL 98 94*  CO2 25 25  GLUCOSE 161* 311*  BUN 16 18  CREATININE 1.22 1.09  CALCIUM 8.6* 7.6*    PT/INR: No results for input(s): LABPROT, INR in the last 72 hours. ABG    Component Value Date/Time   PHART 7.324 (L) 03/30/2021 0015   HCO3 21.0 03/30/2021 0015   TCO2 22 03/30/2021 0015   ACIDBASEDEF 5.0 (H) 03/30/2021 0015   O2SAT 64.3 04/02/2021 0505   CBG (last 3)  Recent Labs    04/02/21 0013 04/02/21 0339 04/02/21 0717  GLUCAP 168* 101* 102*    Assessment/Plan: S/P Procedure(s) (LRB): CORONARY ARTERY BYPASS GRAFTING (CABG)  TIMES FIVE, USING LEFT INTERNAL MAMMARY ARTERY, LEFT RADIAL ARTERY, AND ENDOSCOPICALLY HARVESTED RIGHT GREATER SAPHENOUS VEIN (N/A) RADIAL ARTERY HARVEST (Left) TRANSESOPHAGEAL ECHOCARDIOGRAM (TEE) (N/A) ENDOVEIN HARVEST OF GREATER SAPHENOUS VEIN (Right) POD # 4 CABG NEURO- intact Cv- in Sr with PACs and PVCs this AM  On Iv amiodarone- convert to PO  ASA, beta blocker, statin  Dc external pacer RESP- IS for atelectasis RENAL- creatinine normal, continue diuresis  Hypokalemia- Supplement K ENDO- CBG well controlled with exception of BMEt at 5 AM Gi- tolerating PO  No BM- lactulose Anemia secondary to ABL- mild, monitor SCD + enoxaparin for DVT prophylaxis Ambulate       LOS: 7 days    Melrose Nakayama 04/02/2021

## 2021-04-02 NOTE — Progress Notes (Addendum)
correct 

## 2021-04-02 NOTE — Progress Notes (Signed)
CT surgery PMR  Patient had a good day walk 1 lap around the ICU Maintaining sinus rhythm Positive bowel movement Feels stronger today Blood pressure 116/66, pulse 75, temperature 98.2 F (36.8 C), temperature source Oral, resp. rate (!) 21, height 6\' 5"  (1.956 m), weight 123 kg, SpO2 95 %.;s

## 2021-04-02 NOTE — Progress Notes (Signed)
Progress Note  Patient Name: Gene Weeks Date of Encounter: 04/02/2021  [facility] Cardiologist: Peter Swaziland, MD   Subjective   Feels a little better  Inpatient Medications    Scheduled Meds:  acetaminophen  1,000 mg Oral Q6H   Or   acetaminophen (TYLENOL) oral liquid 160 mg/5 mL  1,000 mg Per Tube Q6H   amiodarone  400 mg Oral BID   aspirin EC  325 mg Oral Daily   Or   aspirin  324 mg Per Tube Daily   atorvastatin  80 mg Oral Daily   bisacodyl  10 mg Oral Daily   Or   bisacodyl  10 mg Rectal Daily   carvedilol  3.125 mg Oral BID WC   Chlorhexidine Gluconate Cloth  6 each Topical Q0600   docusate sodium  200 mg Oral Daily   enoxaparin (LOVENOX) injection  40 mg Subcutaneous QHS   furosemide  20 mg Intravenous BID   insulin aspart  0-5 Units Subcutaneous QHS   insulin aspart  0-9 Units Subcutaneous TID WC   insulin detemir  35 Units Subcutaneous BID   isosorbide mononitrate  30 mg Oral Daily   pantoprazole  40 mg Oral Daily   sodium chloride flush  3 mL Intravenous Q12H   Continuous Infusions:  sodium chloride Stopped (04/01/21 1150)   sodium chloride Stopped (04/01/21 1150)   sodium chloride Stopped (04/02/21 1500)   lactated ringers     lactated ringers Stopped (03/31/21 1736)   lactated ringers Stopped (03/30/21 1316)   PRN Meds: sodium chloride, dextrose, lactated ringers, morphine injection, ondansetron (ZOFRAN) IV, oxyCODONE, sodium chloride flush   Vital Signs    Vitals:   04/02/21 1400 04/02/21 1500 04/02/21 1600 04/02/21 1700  BP: 119/76 114/71 122/68 116/66  Pulse: 74 74 87 75  Resp: (!) 24 (!) 25 (!) 22 (!) 21  Temp:   98.2 F (36.8 C)   TempSrc:   Oral   SpO2: 96% 95% 99% 95%  Weight:      Height:        Intake/Output Summary (Last 24 hours) at 04/02/2021 1808 Last data filed at 04/02/2021 1600 Gross per 24 hour  Intake 973.39 ml  Output 1075 ml  Net -101.61 ml   Last 3 Weights 04/02/2021 04/01/2021 03/30/2021  Weight (lbs) (No Data)  271 lb 2.7 oz 280 lb 13.9 oz  Weight (kg) (No Data) 123 kg 127.4 kg      Telemetry    NSR, PVC - Personally Reviewed  ECG      Physical Exam   GEN: No acute distress.   Neck: No JVD Cardiac: RRR, no murmurs, rubs, or gallops.  Respiratory: Clear to auscultation bilaterally. GI: Soft, nontender, non-distended  MS: No edema; No deformity. Neuro:  Nonfocal  Psych: flat affect   Labs    High Sensitivity Troponin:   Recent Labs  Lab 03/26/21 0926 03/26/21 1144 03/27/21 1809  TROPONINIHS 196* 263* 288*     Chemistry Recent Labs  Lab 03/27/21 0422 03/27/21 1536 03/27/21 2100 03/28/21 0749 03/30/21 0429 03/30/21 1554 03/31/21 0449 04/01/21 0418 04/02/21 0512  NA 137   < > 135   < > 134* 131* 129* 132* 130*  K 3.6   < > 4.2   < > 4.5 5.7* 4.7 4.1 3.3*  CL 105   < > 106   < > 108 102 99 98 94*  CO2 23   < > 23   < > 20* 20* 24  25 25  GLUCOSE 167*   < > 274*   < > 151* 223* 259* 161* 311*  BUN 13   < > 11   < > 9 12 14 16 18   CREATININE 0.96   < > 1.01   < > 1.13 1.28* 1.21 1.22 1.09  CALCIUM 9.0   < > 8.8*   < > 8.3* 8.5* 8.6* 8.6* 7.6*  MG  --    < > 2.1  --  2.2 1.9  --   --   --   PROT 6.5  --   --   --   --   --   --  5.7*  --   ALBUMIN 3.4*  --   --   --   --   --   --  2.7*  --   AST 29  --   --   --   --   --   --  24  --   ALT 43  --   --   --   --   --   --  45*  --   ALKPHOS 61  --   --   --   --   --   --  54  --   BILITOT 0.8  --   --   --   --   --   --  0.5  --   GFRNONAA >60   < > >60   < > >60 >60 >60 >60 >60  ANIONGAP 9   < > 6   < > 6 9 6 9 11    < > = values in this interval not displayed.    Lipids  Recent Labs  Lab 03/27/21 0422  CHOL 151  TRIG 76  HDL 47  LDLCALC 89  CHOLHDL 3.2    Hematology Recent Labs  Lab 03/31/21 0449 04/01/21 0418 04/02/21 0512  WBC 12.0* 8.2 7.3  RBC 3.50* 3.32* 2.98*  HGB 11.1* 10.5* 9.5*  HCT 33.3* 31.8* 28.3*  MCV 95.1 95.8 95.0  MCH 31.7 31.6 31.9  MCHC 33.3 33.0 33.6  RDW 13.2 13.2 13.0   PLT 127* 118* 137*   Thyroid No results for input(s): TSH, FREET4 in the last 168 hours.  BNPNo results for input(s): BNP, PROBNP in the last 168 hours.  DDimer No results for input(s): DDIMER in the last 168 hours.   Radiology    No results found.  Cardiac Studies   EF 20-25%  Patient Profile     46 y.o. male s/p CABG  Assessment & Plan    Off Milrinone.  In NSR on Amio.  Oral Amo load at this time. Will have to decide on long term anticoagulation.   PLan for life vest upon discharge given low EF.      For questions or updates, please contact [facility] Please consult [email or web address] for contact info under        Signed, 05/31/21, MD  04/02/2021, 6:08 PM

## 2021-04-03 LAB — CBC
HCT: 27.9 % — ABNORMAL LOW (ref 39.0–52.0)
Hemoglobin: 9.3 g/dL — ABNORMAL LOW (ref 13.0–17.0)
MCH: 31.8 pg (ref 26.0–34.0)
MCHC: 33.3 g/dL (ref 30.0–36.0)
MCV: 95.5 fL (ref 80.0–100.0)
Platelets: 171 10*3/uL (ref 150–400)
RBC: 2.92 MIL/uL — ABNORMAL LOW (ref 4.22–5.81)
RDW: 13.2 % (ref 11.5–15.5)
WBC: 5.9 10*3/uL (ref 4.0–10.5)
nRBC: 0 % (ref 0.0–0.2)

## 2021-04-03 LAB — COMPREHENSIVE METABOLIC PANEL
ALT: 69 U/L — ABNORMAL HIGH (ref 0–44)
AST: 60 U/L — ABNORMAL HIGH (ref 15–41)
Albumin: 2.4 g/dL — ABNORMAL LOW (ref 3.5–5.0)
Alkaline Phosphatase: 108 U/L (ref 38–126)
Anion gap: 8 (ref 5–15)
BUN: 21 mg/dL — ABNORMAL HIGH (ref 6–20)
CO2: 25 mmol/L (ref 22–32)
Calcium: 8 mg/dL — ABNORMAL LOW (ref 8.9–10.3)
Chloride: 99 mmol/L (ref 98–111)
Creatinine, Ser: 1.04 mg/dL (ref 0.61–1.24)
GFR, Estimated: >60 mL/min (ref >60)
Glucose, Bld: 208 mg/dL — ABNORMAL HIGH (ref 70–99)
Potassium: 3.9 mmol/L (ref 3.5–5.1)
Sodium: 132 mmol/L — ABNORMAL LOW (ref 135–145)
Total Bilirubin: 0.6 mg/dL (ref 0.3–1.2)
Total Protein: 5.5 g/dL — ABNORMAL LOW (ref 6.5–8.1)

## 2021-04-03 LAB — BASIC METABOLIC PANEL
Anion gap: 9 (ref 5–15)
BUN: 20 mg/dL (ref 6–20)
CO2: 25 mmol/L (ref 22–32)
Calcium: 8.6 mg/dL — ABNORMAL LOW (ref 8.9–10.3)
Chloride: 97 mmol/L — ABNORMAL LOW (ref 98–111)
Creatinine, Ser: 1.06 mg/dL (ref 0.61–1.24)
GFR, Estimated: >60 mL/min (ref >60)
Glucose, Bld: 268 mg/dL — ABNORMAL HIGH (ref 70–99)
Potassium: 4 mmol/L (ref 3.5–5.1)
Sodium: 131 mmol/L — ABNORMAL LOW (ref 135–145)

## 2021-04-03 LAB — PROTIME-INR
INR: 1.1 (ref 0.8–1.2)
Prothrombin Time: 14.2 seconds (ref 11.4–15.2)

## 2021-04-03 LAB — GLUCOSE, CAPILLARY
Glucose-Capillary: 187 mg/dL — ABNORMAL HIGH (ref 70–99)
Glucose-Capillary: 289 mg/dL — ABNORMAL HIGH (ref 70–99)
Glucose-Capillary: 153 mg/dL — ABNORMAL HIGH (ref 70–99)
Glucose-Capillary: 119 mg/dL — ABNORMAL HIGH (ref 70–99)

## 2021-04-03 MED ORDER — INSULIN DETEMIR 100 UNIT/ML ~~LOC~~ SOLN
38.0000 [IU] | Freq: Two times a day (BID) | SUBCUTANEOUS | Status: DC
  Administered 2021-04-03: 38 [IU] via SUBCUTANEOUS
  Filled 2021-04-03 (×4): qty 0.38

## 2021-04-03 MED ORDER — SODIUM CHLORIDE 0.9% FLUSH
3.0000 mL | Freq: Two times a day (BID) | INTRAVENOUS | Status: DC
  Administered 2021-04-03 – 2021-04-06 (×7): 3 mL via INTRAVENOUS

## 2021-04-03 MED ORDER — ~~LOC~~ CARDIAC SURGERY, PATIENT & FAMILY EDUCATION: Freq: Once | Status: AC

## 2021-04-03 MED ORDER — LACTULOSE 10 GM/15ML PO SOLN
30.0000 g | Freq: Once | ORAL | Status: AC
  Administered 2021-04-03: 30 g via ORAL
  Filled 2021-04-03: qty 45

## 2021-04-03 MED ORDER — FUROSEMIDE 40 MG PO TABS
40.0000 mg | ORAL_TABLET | Freq: Every day | ORAL | Status: DC
  Administered 2021-04-03 – 2021-04-07 (×5): 40 mg via ORAL
  Filled 2021-04-03 (×5): qty 1

## 2021-04-03 MED ORDER — SODIUM CHLORIDE 0.9% FLUSH: 3.0000 mL | INTRAVENOUS | Status: DC | PRN

## 2021-04-03 MED ORDER — ALUM & MAG HYDROXIDE-SIMETH 200-200-20 MG/5ML PO SUSP
15.0000 mL | Freq: Four times a day (QID) | ORAL | Status: DC | PRN
  Filled 2021-04-03: qty 30

## 2021-04-03 MED ORDER — INSULIN ASPART 100 UNIT/ML IJ SOLN
8.0000 [IU] | Freq: Three times a day (TID) | INTRAMUSCULAR | Status: DC
  Administered 2021-04-03 (×2): 8 [IU] via SUBCUTANEOUS

## 2021-04-03 MED ORDER — SODIUM CHLORIDE 0.9 % IV SOLN: 250.0000 mL | INTRAVENOUS | Status: DC | PRN

## 2021-04-03 MED ORDER — POTASSIUM CHLORIDE CRYS ER 20 MEQ PO TBCR
20.0000 meq | EXTENDED_RELEASE_TABLET | Freq: Every day | ORAL | Status: DC
  Administered 2021-04-03: 20 meq via ORAL
  Filled 2021-04-03: qty 1

## 2021-04-03 MED ORDER — DAPAGLIFLOZIN PROPANEDIOL 10 MG PO TABS
10.0000 mg | ORAL_TABLET | Freq: Every day | ORAL | Status: DC
  Administered 2021-04-03 – 2021-04-07 (×5): 10 mg via ORAL
  Filled 2021-04-03 (×5): qty 1

## 2021-04-03 MED ORDER — METFORMIN HCL ER 500 MG PO TB24
1000.0000 mg | ORAL_TABLET | Freq: Two times a day (BID) | ORAL | Status: DC
  Administered 2021-04-03 – 2021-04-07 (×8): 1000 mg via ORAL
  Filled 2021-04-03 (×10): qty 2

## 2021-04-03 MED ORDER — SORBITOL 70 % SOLN
30.0000 mL | Freq: Once | Status: AC
  Administered 2021-04-03: 30 mL via ORAL
  Filled 2021-04-03: qty 30

## 2021-04-03 MED ORDER — MAGNESIUM HYDROXIDE 400 MG/5ML PO SUSP: 30.0000 mL | Freq: Every day | ORAL | Status: DC | PRN

## 2021-04-03 NOTE — Progress Notes (Addendum)
TCTS DAILY ICU PROGRESS NOTE                   [address]            [place] [address]          [phone]   5 Days Post-Op Procedure(s) (LRB): CORONARY ARTERY BYPASS GRAFTING (CABG) TIMES FIVE, USING LEFT INTERNAL MAMMARY ARTERY, LEFT RADIAL ARTERY, AND ENDOSCOPICALLY HARVESTED RIGHT GREATER SAPHENOUS VEIN (N/A) RADIAL ARTERY HARVEST (Left) TRANSESOPHAGEAL ECHOCARDIOGRAM (TEE) (N/A) ENDOVEIN HARVEST OF GREATER SAPHENOUS VEIN (Right)  Total Length of Stay:  LOS: 8 days   Subjective: Patient eating breakfast this am. He feels bloated and has not had a bowel movement yet. He denies nausea  Objective: Vital signs in last 24 hours: Temp:  [97.9 F (36.6 C)-98.8 F (37.1 C)] 98.8 F (37.1 C) (02/07 0300) Pulse Rate:  [70-87] 83 (02/07 0600) Cardiac Rhythm: Normal sinus rhythm (02/07 0400) Resp:  [7-27] 26 (02/07 0600) BP: (99-126)/(63-109) 113/71 (02/07 0500) SpO2:  [91 %-99 %] 98 % (02/07 0600) Weight:  [129.9 kg] 129.9 kg (02/07 0500)  Filed Weights   03/30/21 0500 04/01/21 0500 04/03/21 0500  Weight: 127.4 kg 123 kg 129.9 kg       Intake/Output from previous day: 02/06 0701 - 02/07 0700 In: 774 [P.O.:600; I.V.:92.3; IV Piggyback:81.7] Out: 1200 [Urine:1200]  Intake/Output this shift: No intake/output data recorded.  Current Meds: Scheduled Meds:  acetaminophen  1,000 mg Oral Q6H   Or   acetaminophen (TYLENOL) oral liquid 160 mg/5 mL  1,000 mg Per Tube Q6H   amiodarone  400 mg Oral BID   aspirin EC  325 mg Oral Daily   Or   aspirin  324 mg Per Tube Daily   atorvastatin  80 mg Oral Daily   bisacodyl  10 mg Oral Daily   Or   bisacodyl  10 mg Rectal Daily   carvedilol  3.125 mg Oral BID WC   Chlorhexidine Gluconate Cloth  6 each Topical Q0600   docusate sodium  200 mg Oral Daily   enoxaparin (LOVENOX) injection  40 mg Subcutaneous QHS   furosemide  20 mg Intravenous BID   insulin aspart  0-5 Units Subcutaneous QHS   insulin aspart  0-9  Units Subcutaneous TID WC   insulin detemir  35 Units Subcutaneous BID   isosorbide mononitrate  30 mg Oral Daily   pantoprazole  40 mg Oral Daily   sodium chloride flush  3 mL Intravenous Q12H   Continuous Infusions:  lactated ringers     lactated ringers Stopped (03/31/21 1736)   lactated ringers Stopped (03/30/21 1316)   PRN Meds:.dextrose, lactated ringers, morphine injection, ondansetron (ZOFRAN) IV, oxyCODONE, sodium chloride flush  General appearance: alert, cooperative, and no distress Neurologic: intact Heart: RRR Lungs: Slightly diminished bibasilar breath sounds Abdomen: Soft, some distention, non tender, sporadic bowel sounds Extremities: mild LE edema. LUE motor/sensory intact Wounds: Sternal wound is clean and dry. LUE and RLE wounds are clean and dry as well.  Lab Results: CBC: Recent Labs    04/02/21 0512 04/03/21 0416  WBC 7.3 5.9  HGB 9.5* 9.3*  HCT 28.3* 27.9*  PLT 137* 171   BMET:  Recent Labs    04/01/21 0418 04/02/21 0512  NA 132* 130*  K 4.1 3.3*  CL 98 94*  CO2 25 25  GLUCOSE 161* 311*  BUN 16 18  CREATININE 1.22 1.09  CALCIUM 8.6* 7.6*    CMET: Lab Results  Component Value Date   WBC 5.9 04/03/2021   HGB 9.3 (L) 04/03/2021   HCT 27.9 (L) 04/03/2021   PLT 171 04/03/2021   GLUCOSE 311 (H) 04/02/2021   CHOL 151 03/27/2021   TRIG 76 03/27/2021   HDL 47 03/27/2021   LDLCALC 89 03/27/2021   ALT 45 (H) 04/01/2021   AST 24 04/01/2021   NA 130 (L) 04/02/2021   K 3.3 (L) 04/02/2021   CL 94 (L) 04/02/2021   CREATININE 1.09 04/02/2021   BUN 18 04/02/2021   CO2 25 04/02/2021   TSH 0.78 03/21/2020   INR 1.3 (H) 03/29/2021   HGBA1C 8.2 (H) 03/26/2021   MICROALBUR 1.2 03/05/2017      PT/INR: No results for input(s): LABPROT, INR in the last 72 hours. Radiology: No results found.   Assessment/Plan: S/P Procedure(s) (LRB): CORONARY ARTERY BYPASS GRAFTING (CABG) TIMES FIVE, USING LEFT INTERNAL MAMMARY ARTERY, LEFT RADIAL ARTERY,  AND ENDOSCOPICALLY HARVESTED RIGHT GREATER SAPHENOUS VEIN (N/A) RADIAL ARTERY HARVEST (Left) TRANSESOPHAGEAL ECHOCARDIOGRAM (TEE) (N/A) ENDOVEIN HARVEST OF GREATER SAPHENOUS VEIN (Right) CV-Previous a fib. Maintaining SR. On Coreg 3.125 mg bid, Imdur 30 mg daiily, and Amiodarone 400 mg bid.  Pulmonary-on 1 liter of oxygen via [place]. Wean as able. Encourage incentive spirometer.  Volume overload-on Lasix 20 mg bid Expected post op blood loss anemia-H and this am 9.3 and 27.9 DM-CBGs 211/328/187. On Insulin but will increase for better glucose control. He was on Farxiga 10 mg daily and Metformin XR 1000 mg bid prior to admission. Will likely restart in a few days. Pre op HGA1C 8.2. Will need close medical follow up after discharge. 6. Mild thrombocytopenia resolved-platelets up to 171,000 7. Will discuss with Dr. Roxan Hockey of if ok to remove EPW 8. On Lovenox for DVT prophylaxis 9. Await this am's CMP to determine potassium dose  Donielle Liston Alba PA-C 04/03/2021 7:08 AM   Patient seen and examined, agree with above Dc central line Will dc pacing wires CBG elevated around meals- restart Farxiga and metformin, add novolog at meal times Continue ambulation Resume ACE-I prior to dc Life vest at DC Transfer to 4E  Remo Lipps C. Roxan Hockey, MD [facility] [phone]

## 2021-04-03 NOTE — Progress Notes (Signed)
° °   °  [address]       [place] [address]             [phone]      Asked by nurse to assist with pacer wire removal d/t resistance. V- wires removed without difficulty. Patient tolerated well. Routine observation per protocol  Rowe Clack, PA-C

## 2021-04-03 NOTE — Progress Notes (Addendum)
Heart Failure Nurse Navigator Progress Note  Visited with patient regarding plan for HV TOC clinic appt upon discharge. Pt lives in a upper level apartment in [place], but plans to stay with significant other in [place] as she lives in ranch style house with no stairs. Pt voices pain from straining with BM today, hopeful he can have a full BM tomorrow with less straining. Reminded of driving restrictions, pt states girlfriend will drive him. Has PCP, but has not seen for "a long time". Pt wants to continue to medical care in [place] and to establish cardiology care upon DC.   Will plan to continue education as pt moves out of ICU. Pending HV TOC clinic appt.   Ozella Rocks, MSN, RN Heart Failure Nurse Navigator [phone]

## 2021-04-03 NOTE — Progress Notes (Signed)
Right pacer wire removed with no difficulty, left side had some resistance. Jadene Pierini PA to beside to assist with removal. No adverse reactions-VS per protocol. Pt resting flat for 1 hour

## 2021-04-03 NOTE — Progress Notes (Signed)
CARDIAC REHAB PHASE I   Offered to walk with pt. Pt declining walk at this time, ambulated this am and then strained with BM, having lots of chest soreness. Pt currently demonstrating ~1250 on IS. States some discouragement after surgery from not feeling better. Provided support and encouragement. Pt educated on the expected recovery track of having good and bad days. Appetite slowly improving. Pt unsure about DME at this time. Encouraged a second walk before bed. Will continue to follow.  [id number] Reynold Bowen, RN BSN 04/03/2021 2:54 PM

## 2021-04-03 NOTE — Care Management (Signed)
04-03-21 1242 Information for Life Vest to be submitted to [employer] for insurance approval. Case Manager will continue to follow for additional transition of care needs.

## 2021-04-03 NOTE — Progress Notes (Signed)
Pt transferred to 4E10, report given to [facility]. All pt belongings taken with patient, family updated by patient.

## 2021-04-04 ENCOUNTER — Inpatient Hospital Stay (HOSPITAL_COMMUNITY): Payer: BC Managed Care – PPO

## 2021-04-04 LAB — BASIC METABOLIC PANEL
Anion gap: 10 (ref 5–15)
BUN: 18 mg/dL (ref 6–20)
CO2: 26 mmol/L (ref 22–32)
Calcium: 8.8 mg/dL — ABNORMAL LOW (ref 8.9–10.3)
Chloride: 98 mmol/L (ref 98–111)
Creatinine, Ser: 1.22 mg/dL (ref 0.61–1.24)
GFR, Estimated: >60 mL/min (ref >60)
Glucose, Bld: 108 mg/dL — ABNORMAL HIGH (ref 70–99)
Potassium: 3.8 mmol/L (ref 3.5–5.1)
Sodium: 134 mmol/L — ABNORMAL LOW (ref 135–145)

## 2021-04-04 LAB — GLUCOSE, CAPILLARY
Glucose-Capillary: 107 mg/dL — ABNORMAL HIGH (ref 70–99)
Glucose-Capillary: 165 mg/dL — ABNORMAL HIGH (ref 70–99)
Glucose-Capillary: 162 mg/dL — ABNORMAL HIGH (ref 70–99)
Glucose-Capillary: 149 mg/dL — ABNORMAL HIGH (ref 70–99)

## 2021-04-04 MED ORDER — SOLIQUA 100-33 UNT-MCG/ML ~~LOC~~ SOPN: 21.0000 [IU] | PEN_INJECTOR | Freq: Every day | SUBCUTANEOUS | Status: DC

## 2021-04-04 MED ORDER — INSULIN DETEMIR 100 UNIT/ML ~~LOC~~ SOLN
34.0000 [IU] | Freq: Two times a day (BID) | SUBCUTANEOUS | Status: DC
  Administered 2021-04-04: 34 [IU] via SUBCUTANEOUS
  Filled 2021-04-04 (×4): qty 0.34

## 2021-04-04 MED ORDER — METOCLOPRAMIDE HCL 5 MG/5ML PO SOLN
10.0000 mg | Freq: Three times a day (TID) | ORAL | Status: AC
  Administered 2021-04-04 – 2021-04-06 (×11): 10 mg via ORAL
  Filled 2021-04-04 (×12): qty 10

## 2021-04-04 MED ORDER — POTASSIUM CHLORIDE CRYS ER 20 MEQ PO TBCR
20.0000 meq | EXTENDED_RELEASE_TABLET | Freq: Two times a day (BID) | ORAL | Status: AC
  Administered 2021-04-04: 20 meq via ORAL
  Filled 2021-04-04 (×3): qty 1

## 2021-04-04 MED ORDER — GUAIFENESIN-DM 100-10 MG/5ML PO SYRP
15.0000 mL | ORAL_SOLUTION | ORAL | Status: DC | PRN
  Administered 2021-04-04: 15 mL via ORAL
  Filled 2021-04-04: qty 15

## 2021-04-04 MED ORDER — BENZONATATE 100 MG PO CAPS
200.0000 mg | ORAL_CAPSULE | Freq: Three times a day (TID) | ORAL | Status: DC
  Administered 2021-04-04 – 2021-04-06 (×7): 200 mg via ORAL
  Filled 2021-04-04 (×9): qty 2

## 2021-04-04 MED ORDER — POTASSIUM CHLORIDE CRYS ER 20 MEQ PO TBCR
20.0000 meq | EXTENDED_RELEASE_TABLET | Freq: Every day | ORAL | Status: DC
  Administered 2021-04-05 – 2021-04-07 (×3): 20 meq via ORAL
  Filled 2021-04-04 (×3): qty 1

## 2021-04-04 MED ORDER — ASPIRIN 325 MG PO TBEC: 325.0000 mg | DELAYED_RELEASE_TABLET | Freq: Every day | ORAL | 0 refills | Status: DC

## 2021-04-04 MED ORDER — INSULIN ASPART 100 UNIT/ML IJ SOLN
6.0000 [IU] | Freq: Three times a day (TID) | INTRAMUSCULAR | Status: DC
  Administered 2021-04-06 – 2021-04-07 (×2): 6 [IU] via SUBCUTANEOUS

## 2021-04-04 MED ORDER — GUAIFENESIN ER 600 MG PO TB12
1200.0000 mg | ORAL_TABLET | Freq: Two times a day (BID) | ORAL | Status: DC
  Administered 2021-04-04 – 2021-04-05 (×4): 1200 mg via ORAL
  Filled 2021-04-04 (×7): qty 2
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
## 2021-04-04 NOTE — Plan of Care (Signed)
  Problem: Health Behavior/Discharge Planning: Goal: Ability to manage health-related needs will improve Outcome: Progressing   Problem: Clinical Measurements: Goal: Will remain free from infection Outcome: Progressing Goal: Diagnostic test results will improve Outcome: Progressing   

## 2021-04-04 NOTE — Progress Notes (Signed)
Patient requested suppository.  Patient was able to have medium size BM with red streaks.   Patient tolerated well. Patient placed back in bed. Will continue to monitor

## 2021-04-04 NOTE — Progress Notes (Signed)
CARDIAC REHAB PHASE I   PRE:  Rate/Rhythm: 82 SR  BP:  Sitting: 110/73      MODE:  Ambulation: 270 ft   POST:  Rate/Rhythm: 87 SR  BP:  Sitting: 123/74    Pt agreeable to ambulation. After some some time, pt assisted to stand. Pt used urinal, than ambulated 278ft in hallway standby assist with front wheel walker. Pt with slow, steady gait. Pt denies CP, SOB, or dizziness. Does endorse nausea and sensitivities to smells. Pt returned to recliner. Encouraged continued IS use, walks, and sternal precautions. Pt requesting walker and 3-in-1 for home use, CM made aware. Will continue to follow.  [id number] Reynold Bowen, RN BSN 04/04/2021 2:19 PM

## 2021-04-04 NOTE — Progress Notes (Addendum)
Notified by patient that he woke up coughing and making his chest hurt.   Called Dr. Dorris Fetch to get order for cough.  Order placed. Will continue to monitor

## 2021-04-04 NOTE — Progress Notes (Signed)
° ° °  Paperwork completed and patient was approved for lifevest. Plans for fitting today. Follow up in the office has been arranged.   SignedLaverda Page, NP-C 04/04/2021, 11:14 AM Pager: [phone]

## 2021-04-05 ENCOUNTER — Encounter (HOSPITAL_COMMUNITY): Payer: Self-pay | Admitting: Thoracic Surgery (Cardiothoracic Vascular Surgery)

## 2021-04-05 LAB — GLUCOSE, CAPILLARY
Glucose-Capillary: 139 mg/dL — ABNORMAL HIGH (ref 70–99)
Glucose-Capillary: 221 mg/dL — ABNORMAL HIGH (ref 70–99)
Glucose-Capillary: 138 mg/dL — ABNORMAL HIGH (ref 70–99)
Glucose-Capillary: 175 mg/dL — ABNORMAL HIGH (ref 70–99)

## 2021-04-05 MED ORDER — METOLAZONE 5 MG PO TABS: 5.0000 mg | ORAL_TABLET | Freq: Every day | ORAL | Status: DC

## 2021-04-05 MED ORDER — INSULIN DETEMIR 100 UNIT/ML ~~LOC~~ SOLN
36.0000 [IU] | Freq: Two times a day (BID) | SUBCUTANEOUS | Status: DC
  Administered 2021-04-05 – 2021-04-07 (×5): 36 [IU] via SUBCUTANEOUS
  Filled 2021-04-05 (×8): qty 0.36

## 2021-04-05 MED ORDER — LISINOPRIL 20 MG PO TABS
20.0000 mg | ORAL_TABLET | Freq: Every day | ORAL | Status: DC
  Administered 2021-04-05 – 2021-04-06 (×2): 20 mg via ORAL
  Filled 2021-04-05 (×2): qty 1

## 2021-04-05 MED ORDER — METOLAZONE 5 MG PO TABS
2.5000 mg | ORAL_TABLET | Freq: Every day | ORAL | Status: AC
  Administered 2021-04-05 – 2021-04-06 (×2): 2.5 mg via ORAL
  Filled 2021-04-05 (×2): qty 1

## 2021-04-05 MED FILL — Mannitol IV Soln 20%: INTRAVENOUS | Qty: 500 | Status: AC

## 2021-04-05 MED FILL — Sodium Bicarbonate IV Soln 8.4%: INTRAVENOUS | Qty: 50 | Status: AC

## 2021-04-05 MED FILL — Sodium Chloride IV Soln 0.9%: INTRAVENOUS | Qty: 2000 | Status: AC

## 2021-04-05 MED FILL — Heparin Sodium (Porcine) Inj 1000 Unit/ML: INTRAMUSCULAR | Qty: 10 | Status: AC

## 2021-04-05 MED FILL — Lidocaine HCl (Cardiac) IV PF Soln 100 MG/5ML (2%): INTRAVENOUS | Qty: 5 | Status: AC

## 2021-04-05 MED FILL — Electrolyte-R (PH 7.4) Solution: INTRAVENOUS | Qty: 3000 | Status: AC

## 2021-04-05 NOTE — Progress Notes (Signed)
Heart Failure Nurse Navigator Progress Note  Updated AVS with HV TOC scheduled appt 2/17 @ 10AM. Gave parking instructions. Updated [facility], no immediate needs noted. Pt has some concern regarding medication cost.    Ozella Rocks, MSN, RN Heart Failure Nurse Navigator [phone]

## 2021-04-05 NOTE — Progress Notes (Signed)
CARDIAC REHAB PHASE I   PRE:  Rate/Rhythm: 77 SR  BP:  Sitting: 141/84      SaO2: 100 RA  MODE:  Ambulation: 500 ft   POST:  Rate/Rhythm: 84 SR    SaO2: 100 RA   Pt seen ambulating in hallway independently with front wheel walker. Joined pt, pt states he feels better today. Able to have a BM. Pt ambulated for a total of 51ft. Returned to [employer]. Encouraged continued ambulation and IS use. Will continue to follow.  [id number] Reynold Bowen, RN BSN 04/05/2021 8:39 AM
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
## 2021-04-05 NOTE — Progress Notes (Signed)
Mobility Specialist Progress Note   04/05/21 1815  Mobility  Activity Ambulated independently in hallway  Level of Assistance Minimal assist, patient does 75% or more  Assistive Device Front wheel walker  Distance Ambulated (ft) 490 ft  Activity Response Tolerated well  $Mobility charge 1 Mobility   Received pt in chair c/o being tired but agreeable. MinA for STS but contact G for ambulation. X1 standing rest break d/t fatigue, returned back to chair w/ call bell in reach and family in reach.   Frederico Hamman Mobility Specialist Phone Number [phone]

## 2021-04-05 NOTE — Progress Notes (Signed)
Heart Failure Stewardship Pharmacist Progress Note   PCP: Ardith Dark, MD PCP-Cardiologist: Peter Swaz[place], MD    HPI:  46 yo M with PMH of T2DM and HTN. He presented to the ED on 1/30 with chest pain and near syncope. Taken to cath on 1/31 and found to have severe multivessel occlusive CAD. Complicated by VT arrest on 1/31. S/p CABG on 2/2. An ECHO on 1/30 showed reduced LVEF of 20-25%.  Current HF Medications: Diuretic: furosemide 40 mg PO daily Beta Blocker: carvedilol 3.125 mg BID ACE/ARB/ARNI: lisinopril 20 mg daily SGLT2i: Farxiga 10 mg daily Other: Imdur 30 mg daily  Prior to admission HF Medications: ACE/ARB/ARNI: lisinopril 20 mg daily SGLT2i: Farxiga 10 mg daily  Pertinent Lab Values: Serum creatinine 1.22, BUN 18, Potassium 3.8, Sodium 134, A1c 8.2   Vital Signs: Weight: 286 lbs (admission weight: 268 lbs) Blood pressure: 120/70s  Heart rate: 70s   Medication Assistance / Insurance Benefits Check: Does the patient have prescription insurance?  Yes Type of insurance plan: [employer]  Outpatient Pharmacy:  Prior to admission outpatient pharmacy: [facility] Is the patient willing to use [facility] TOC pharmacy at discharge? Yes Is the patient willing to transition their outpatient pharmacy to utilize a [facility] outpatient pharmacy?   Pending    Assessment: 1. 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 NYHA class II symptoms. - Continue furosemide 40 mg PO daily - Continue carvedilol 3.125 mg BID - Consider transitioning lisinopril to Entresto (can bridge with losartan during washout period if needed) - Consider adding spironolactone prior to discharge - Continue Farxiga 10 mg daily   Plan: 1) Medication changes recommended at this time: - Stop lisinopril - Start losartan x 1 day OR start Entresto beginning tomorrow night if he receives lisinopril today  2) Patient assistance: - Entresto prior auth approved through 03/27/22 - copay $47 but can use  copay cards with commercial insurance (will lower to $10)  3)  Education  - To be completed prior to discharge  Sharen Hones, PharmD, BCPS Heart Failure Engineer, building services Phone [phone]

## 2021-04-06 DIAGNOSIS — I25118 Atherosclerotic heart disease of native coronary artery with other forms of angina pectoris: Secondary | ICD-10-CM

## 2021-04-06 DIAGNOSIS — Z951 Presence of aortocoronary bypass graft: Secondary | ICD-10-CM

## 2021-04-06 LAB — BASIC METABOLIC PANEL
Anion gap: 12 (ref 5–15)
BUN: 13 mg/dL (ref 6–20)
CO2: 26 mmol/L (ref 22–32)
Calcium: 9 mg/dL (ref 8.9–10.3)
Chloride: 95 mmol/L — ABNORMAL LOW (ref 98–111)
Creatinine, Ser: 1.09 mg/dL (ref 0.61–1.24)
GFR, Estimated: >60 mL/min (ref >60)
Glucose, Bld: 114 mg/dL — ABNORMAL HIGH (ref 70–99)
Potassium: 4.2 mmol/L (ref 3.5–5.1)
Sodium: 133 mmol/L — ABNORMAL LOW (ref 135–145)

## 2021-04-06 LAB — GLUCOSE, CAPILLARY
Glucose-Capillary: 143 mg/dL — ABNORMAL HIGH (ref 70–99)
Glucose-Capillary: 117 mg/dL — ABNORMAL HIGH (ref 70–99)
Glucose-Capillary: 110 mg/dL — ABNORMAL HIGH (ref 70–99)
Glucose-Capillary: 175 mg/dL — ABNORMAL HIGH (ref 70–99)

## 2021-04-06 MED ORDER — CLOPIDOGREL BISULFATE 75 MG PO TABS
75.0000 mg | ORAL_TABLET | Freq: Every day | ORAL | Status: DC
  Administered 2021-04-06 – 2021-04-07 (×2): 75 mg via ORAL
  Filled 2021-04-06 (×2): qty 1

## 2021-04-06 MED ORDER — SACUBITRIL-VALSARTAN 24-26 MG PO TABS: 1.0000 | ORAL_TABLET | Freq: Two times a day (BID) | ORAL | Status: DC

## 2021-04-06 MED ORDER — AMIODARONE HCL 200 MG PO TABS
200.0000 mg | ORAL_TABLET | Freq: Every day | ORAL | Status: DC
  Administered 2021-04-07: 200 mg via ORAL
  Filled 2021-04-06: qty 1

## 2021-04-06 MED ORDER — ASPIRIN EC 81 MG PO TBEC
81.0000 mg | DELAYED_RELEASE_TABLET | Freq: Every day | ORAL | Status: DC
  Administered 2021-04-06 – 2021-04-07 (×2): 81 mg via ORAL
  Filled 2021-04-06 (×2): qty 1

## 2021-04-06 NOTE — Progress Notes (Signed)
CARDIAC REHAB PHASE I   PRE:  Rate/Rhythm: 77 SR  MODE:  Ambulation: 950 ft   POST:  Rate/Rhythm: 88 SR   Pt ambulated 966ft in hallway independently with steady gait. Pt ambulate ~375ft without walker, and remaining with walker. Pt states some soreness at incision, but denies dizziness or SOB. D/c education completed with pt. Pt educated on importance of site care and monitoring incisions daily. Encouraged continued IS use, walks, and sternal precautions. Pt given in-the-tube sheet along with heart healthy and diabetic diets. Reviewed restrictions and exercise guidelines. Referred to [facility] [place].  [id number] Rufina Falco, RN BSN 04/06/2021 10:51 AM

## 2021-04-06 NOTE — Progress Notes (Signed)
Mobility Specialist Progress Note:   04/06/21 1600  Mobility  Activity Ambulated with assistance in hallway  Level of Assistance Standby assist, set-up cues, supervision of patient - no hands on  Assistive Device None  Distance Ambulated (ft) 200 ft  Activity Response Tolerated well  $Mobility charge 1 Mobility    Pre Mobility: HR 90 bpm During Mobility: HR 91 bpm Post Mobility: HR 90 bpm  Pt received in BR, requiring minG-minA to stand from low toilet while adhering to sternal precautions. No physical assistance required otherwise. Pt left in chair with all needs met.   Nelta Numbers Mobility Specialist  Phone: [phone]

## 2021-04-06 NOTE — Progress Notes (Signed)
Progress Note  Patient Name: Gene Weeks Date of Encounter: 04/06/2021  [facility] Cardiologist: Peter Martinique, MD   Subjective   Feeling stronger.  No palpitations.  Inpatient Medications    Scheduled Meds:  amiodarone  400 mg Oral BID   aspirin EC  325 mg Oral Daily   Or   aspirin  324 mg Per Tube Daily   atorvastatin  80 mg Oral Daily   benzonatate  200 mg Oral TID   bisacodyl  10 mg Oral Daily   Or   bisacodyl  10 mg Rectal Daily   carvedilol  3.125 mg Oral BID WC   Chlorhexidine Gluconate Cloth  6 each Topical Q0600   dapagliflozin propanediol  10 mg Oral Daily   docusate sodium  200 mg Oral Daily   enoxaparin (LOVENOX) injection  40 mg Subcutaneous QHS   furosemide  40 mg Oral Daily   guaiFENesin  1,200 mg Oral BID   insulin aspart  0-5 Units Subcutaneous QHS   insulin aspart  0-9 Units Subcutaneous TID WC   insulin aspart  6 Units Subcutaneous TID WC   insulin detemir  36 Units Subcutaneous BID   isosorbide mononitrate  30 mg Oral Daily   metFORMIN  1,000 mg Oral BID WC   metoCLOPramide  10 mg Oral TID AC & HS   pantoprazole  40 mg Oral Daily   potassium chloride  20 mEq Oral Daily   [START ON 04/07/2021] sacubitril-valsartan  1 tablet Oral BID   sodium chloride flush  3 mL Intravenous Q12H   Continuous Infusions:  sodium chloride     PRN Meds: sodium chloride, alum & mag hydroxide-simeth, magnesium hydroxide, ondansetron (ZOFRAN) IV, oxyCODONE, sodium chloride flush   Vital Signs    Vitals:   04/06/21 0215 04/06/21 0425 04/06/21 0827 04/06/21 1105  BP:  107/64 111/70 118/74  Pulse:  74 75 77  Resp:  19 20 20   Temp:  98.4 F (36.9 C) 98.5 F (36.9 C) 98.4 F (36.9 C)  TempSrc:  Oral Oral Oral  SpO2:  98% 98% 98%  Weight: 125.3 kg     Height:        Intake/Output Summary (Last 24 hours) at 04/06/2021 1154 Last data filed at 04/06/2021 0426 Gross per 24 hour  Intake 135 ml  Output 601 ml  Net -466 ml   Last 3 Weights 04/06/2021 04/05/2021  04/04/2021  Weight (lbs) 276 lb 4.8 oz 286 lb 3.2 oz 286 lb 3.2 oz  Weight (kg) 125.329 kg 129.819 kg 129.819 kg      Telemetry    Normal sinus rhythm- Personally Reviewed  ECG      Physical Exam   GEN: No acute distress.   Neck: No JVD Cardiac: RRR, no murmurs, rubs, or gallops.  Respiratory: Clear to auscultation bilaterally. GI: Soft, nontender, non-distended  MS: No edema; No deformity. Neuro:  Nonfocal  Psych: Normal affect   Labs    High Sensitivity Troponin:   Recent Labs  Lab 03/26/21 0926 03/26/21 1144 03/27/21 1809  TROPONINIHS 196* 263* 288*     Chemistry Recent Labs  Lab 03/30/21 1554 03/31/21 0449 04/01/21 0418 04/02/21 0512 04/03/21 0416 04/03/21 1021 04/04/21 0259 04/06/21 0649  NA 131*   < > 132*   < > 132* 131* 134* 133*  K 5.7*   < > 4.1   < > 3.9 4.0 3.8 4.2  CL 102   < > 98   < > 99 97* 98  95*  CO2 20*   < > 25   < > 25 25 26 26   GLUCOSE 223*   < > 161*   < > 208* 268* 108* 114*  BUN 12   < > 16   < > 21* 20 18 13   CREATININE 1.28*   < > 1.22   < > 1.04 1.06 1.22 1.09  CALCIUM 8.5*   < > 8.6*   < > 8.0* 8.6* 8.8* 9.0  MG 1.9  --   --   --   --   --   --   --   PROT  --   --  5.7*  --  5.5*  --   --   --   ALBUMIN  --   --  2.7*  --  2.4*  --   --   --   AST  --   --  24  --  60*  --   --   --   ALT  --   --  45*  --  69*  --   --   --   ALKPHOS  --   --  54  --  108  --   --   --   BILITOT  --   --  0.5  --  0.6  --   --   --   GFRNONAA >60   < > >60   < > >60 >60 >60 >60  ANIONGAP 9   < > 9   < > 8 9 10 12    < > = values in this interval not displayed.    Lipids No results for input(s): CHOL, TRIG, HDL, LABVLDL, LDLCALC, CHOLHDL in the last 168 hours.  Hematology Recent Labs  Lab 04/01/21 0418 04/02/21 0512 04/03/21 0416  WBC 8.2 7.3 5.9  RBC 3.32* 2.98* 2.92*  HGB 10.5* 9.5* 9.3*  HCT 31.8* 28.3* 27.9*  MCV 95.8 95.0 95.5  MCH 31.6 31.9 31.8  MCHC 33.0 33.6 33.3  RDW 13.2 13.0 13.2  PLT 118* 137* 171   Thyroid No  results for input(s): TSH, FREET4 in the last 168 hours.  BNPNo results for input(s): BNP, PROBNP in the last 168 hours.  DDimer No results for input(s): DDIMER in the last 168 hours.   Radiology    No results found.  Cardiac Studies   Cath showed three-vessel coronary disease, low EF by echo  Patient Profile     46 y.o. male LV dysfunction  Assessment & Plan    Atrial fibrillation: Rhythm now stable in normal sinus rhythm on oral amiodarone. Due to HFrEF, will plan for LifeVest at discharge.  We will arrange cardiology follow-up.  He is already on high-dose statin.  He is starting Entresto.  He is taking carvedilol as well.     For questions or updates, please contact [place] Please consult [email or web address] for contact info under        Signed, Larae Grooms, MD  04/06/2021, 11:54 AM

## 2021-04-06 NOTE — Progress Notes (Addendum)
[address]       [place] [address]             [phone]      8 Days Post-Op Procedure(s) (LRB): CORONARY ARTERY BYPASS GRAFTING (CABG) TIMES FIVE, USING LEFT INTERNAL MAMMARY ARTERY, LEFT RADIAL ARTERY, AND ENDOSCOPICALLY HARVESTED RIGHT GREATER SAPHENOUS VEIN (N/A) RADIAL ARTERY HARVEST (Left) TRANSESOPHAGEAL ECHOCARDIOGRAM (TEE) (N/A) ENDOVEIN HARVEST OF GREATER SAPHENOUS VEIN (Right) Subjective: Feels "queasy" but somewhat better, smells are bothering him , some improved intake  Objective: Vital signs in last 24 hours: Temp:  [98.3 F (36.8 C)-99.2 F (37.3 C)] 98.4 F (36.9 C) (02/10 0425) Pulse Rate:  [74-82] 74 (02/10 0425) Cardiac Rhythm: Normal sinus rhythm;Bundle branch block (02/09 1900) Resp:  [18-20] 19 (02/10 0425) BP: (99-141)/(57-81) 107/64 (02/10 0425) SpO2:  [94 %-100 %] 98 % (02/10 0425) Weight:  [125.3 kg] 125.3 kg (02/10 0215)  Hemodynamic parameters for last 24 hours:    Intake/Output from previous day: 02/09 0701 - 02/10 0700 In: 138 [P.O.:135; I.V.:3] Out: 1201 [Urine:1201] Intake/Output this shift: No intake/output data recorded.  General appearance: alert, cooperative, and no distress Heart: regular rate and rhythm Lungs: dim in bases Abdomen: soft, non render or distended Extremities: + pitting bilat edema LE's Wound: incis healing well  Lab Results: No results for input(s): WBC, HGB, HCT, PLT in the last 72 hours. BMET:  Recent Labs    04/03/21 1021 04/04/21 0259  NA 131* 134*  K 4.0 3.8  CL 97* 98  CO2 25 26  GLUCOSE 268* 108*  BUN 20 18  CREATININE 1.06 1.22  CALCIUM 8.6* 8.8*    PT/INR:  Recent Labs    04/03/21 1021  LABPROT 14.2  INR 1.1   ABG    Component Value Date/Time   PHART 7.324 (L) 03/30/2021 0015   HCO3 21.0 03/30/2021 0015   TCO2 22 03/30/2021 0015   ACIDBASEDEF 5.0 (H) 03/30/2021 0015   O2SAT 64.3 04/02/2021 0505   CBG (last 3)  Recent Labs    04/05/21 1733  04/05/21 2107 04/06/21 0430  GLUCAP 138* 175* 143*    Meds Scheduled Meds:  amiodarone  400 mg Oral BID   aspirin EC  325 mg Oral Daily   Or   aspirin  324 mg Per Tube Daily   atorvastatin  80 mg Oral Daily   benzonatate  200 mg Oral TID   bisacodyl  10 mg Oral Daily   Or   bisacodyl  10 mg Rectal Daily   carvedilol  3.125 mg Oral BID WC   Chlorhexidine Gluconate Cloth  6 each Topical Q0600   dapagliflozin propanediol  10 mg Oral Daily   docusate sodium  200 mg Oral Daily   enoxaparin (LOVENOX) injection  40 mg Subcutaneous QHS   furosemide  40 mg Oral Daily   guaiFENesin  1,200 mg Oral BID   insulin aspart  0-5 Units Subcutaneous QHS   insulin aspart  0-9 Units Subcutaneous TID WC   insulin aspart  6 Units Subcutaneous TID WC   insulin detemir  36 Units Subcutaneous BID   isosorbide mononitrate  30 mg Oral Daily   lisinopril  20 mg Oral Daily   metFORMIN  1,000 mg Oral BID WC   metoCLOPramide  10 mg Oral TID AC & HS   metolazone  2.5 mg Oral Daily   pantoprazole  40 mg Oral Daily   potassium chloride  20 mEq Oral Daily  sodium chloride flush  3 mL Intravenous Q12H   Continuous Infusions:  sodium chloride     PRN Meds:.sodium chloride, alum & mag hydroxide-simeth, magnesium hydroxide, ondansetron (ZOFRAN) IV, oxyCODONE, sodium chloride flush  Xrays No results found.  Assessment/Plan: S/P Procedure(s) (LRB): CORONARY ARTERY BYPASS GRAFTING (CABG) TIMES FIVE, USING LEFT INTERNAL MAMMARY ARTERY, LEFT RADIAL ARTERY, AND ENDOSCOPICALLY HARVESTED RIGHT GREATER SAPHENOUS VEIN (N/A) RADIAL ARTERY HARVEST (Left) TRANSESOPHAGEAL ECHOCARDIOGRAM (TEE) (N/A) ENDOVEIN HARVEST OF GREATER SAPHENOUS VEIN (Right) POD#8  1 afeb, VSS, s BP 99-141. Maintaining Sinus rhythm 2 sats good on RA 3 voiding well, weight trending down, cont diuresis 4  BMET pending 5 DM fair control of BS 139-221 range yesterday, home meds at d/c with close PMD follow -up 6 maximize heart failure  meds 7 hopefully home in next day or so   LOS: 11 days    Rowe Clack PA-C Pager [phone]  04/06/2021  Patient seen and examined, agree with above Probably home in AM with Life vest   Viviann Spare C. Dorris Fetch, MD [facility] [phone]

## 2021-04-06 NOTE — Progress Notes (Signed)
Heart Failure Stewardship Pharmacist Progress Note   PCP: [name], MD PCP-Cardiologist: Peter Martinique, MD    HPI:  46 yo M with PMH of T2DM and HTN. He presented to the ED on 1/30 with chest pain and near syncope. Taken to cath on 1/31 and found to have severe multivessel occlusive CAD. Complicated by VT arrest on 1/31. S/p CABG on 2/2. An ECHO on 1/30 showed reduced LVEF of 20-25%.  Current HF Medications: Diuretic: furosemide 40 mg PO daily Beta Blocker: carvedilol 3.125 mg BID ACE/ARB/ARNI: lisinopril 20 mg daily SGLT2i: Farxiga 10 mg daily Other: Imdur 30 mg daily  Prior to admission HF Medications: ACE/ARB/ARNI: lisinopril 20 mg daily SGLT2i: Farxiga 10 mg daily  Pertinent Lab Values: Serum creatinine 1.09, BUN 13, Potassium 4.2, Sodium 133, A1c 8.2   Vital Signs: Weight: 276 lbs (admission weight: 268 lbs) Blood pressure: 110/70s  Heart rate: 70s   Medication Assistance / Insurance Benefits Check: Does the patient have prescription insurance?  Yes Type of insurance plan: [employer]  Outpatient Pharmacy:  Prior to admission outpatient pharmacy: [facility] Is the patient willing to use [place] at discharge? Yes Is the patient willing to transition their outpatient pharmacy to utilize a [facility] outpatient pharmacy?   Pending    Assessment: 1. Acute systolic CHF (EF 0000000), due to ICM. NYHA class II symptoms. - Continue furosemide 40 mg PO daily - Continue carvedilol 3.125 mg BID - Consider transitioning lisinopril to Entresto (can bridge with losartan during washout period if needed) - Consider adding spironolactone prior to discharge - Continue Farxiga 10 mg daily   Plan: 1) Medication changes recommended at this time: - Stop lisinopril - Start losartan x 1 day OR start Entresto beginning tomorrow night  2) Patient assistance: - Entresto prior auth approved through 03/27/22 - copay $47 but can use copay cards with commercial  insurance (will lower to $10)  3)  Education  - To be completed prior to discharge  Kerby Nora, PharmD, BCPS Heart Failure Cytogeneticist Phone [phone]

## 2021-04-07 LAB — GLUCOSE, CAPILLARY
Glucose-Capillary: 81 mg/dL (ref 70–99)
Glucose-Capillary: 150 mg/dL — ABNORMAL HIGH (ref 70–99)
Glucose-Capillary: 107 mg/dL — ABNORMAL HIGH (ref 70–99)

## 2021-04-07 MED ORDER — ISOSORBIDE MONONITRATE ER 30 MG PO TB24: 30.0000 mg | ORAL_TABLET | Freq: Every day | ORAL | 2 refills | Status: DC

## 2021-04-07 MED ORDER — CLOPIDOGREL BISULFATE 75 MG PO TABS: 75.0000 mg | ORAL_TABLET | Freq: Every day | ORAL | 11 refills | Status: DC

## 2021-04-07 MED ORDER — FUROSEMIDE 40 MG PO TABS: 40.0000 mg | ORAL_TABLET | Freq: Every day | ORAL | 0 refills | Status: DC

## 2021-04-07 MED ORDER — CARVEDILOL 3.125 MG PO TABS: 3.1250 mg | ORAL_TABLET | Freq: Two times a day (BID) | ORAL | 2 refills | Status: DC

## 2021-04-07 MED ORDER — OXYCODONE HCL 5 MG PO TABS: 5.0000 mg | ORAL_TABLET | ORAL | 0 refills | Status: DC | PRN

## 2021-04-07 MED ORDER — SACUBITRIL-VALSARTAN 24-26 MG PO TABS: 1.0000 | ORAL_TABLET | Freq: Two times a day (BID) | ORAL | 2 refills | Status: DC

## 2021-04-07 MED ORDER — AMIODARONE HCL 200 MG PO TABS: 200.0000 mg | ORAL_TABLET | Freq: Every day | ORAL | 2 refills | Status: DC

## 2021-04-07 MED ORDER — ATORVASTATIN CALCIUM 80 MG PO TABS: 80.0000 mg | ORAL_TABLET | Freq: Every day | ORAL | 2 refills | Status: DC

## 2021-04-07 MED ORDER — POTASSIUM CHLORIDE CRYS ER 20 MEQ PO TBCR: 20.0000 meq | EXTENDED_RELEASE_TABLET | Freq: Every day | ORAL | 0 refills | Status: DC

## 2021-04-07 NOTE — Progress Notes (Signed)
Progress Note  Patient Name: Gene Weeks Date of Encounter: 04/07/2021  Primary Cardiologist:   Peter [place], MD   Subjective   Feelign well this AM. Lifevest at bedside.  Inpatient Medications    Scheduled Meds:  amiodarone  200 mg Oral Daily   aspirin EC  81 mg Oral Daily   atorvastatin  80 mg Oral Daily   benzonatate  200 mg Oral TID   bisacodyl  10 mg Oral Daily   Or   bisacodyl  10 mg Rectal Daily   carvedilol  3.125 mg Oral BID WC   Chlorhexidine Gluconate Cloth  6 each Topical Q0600   clopidogrel  75 mg Oral Daily   dapagliflozin propanediol  10 mg Oral Daily   docusate sodium  200 mg Oral Daily   enoxaparin (LOVENOX) injection  40 mg Subcutaneous QHS   furosemide  40 mg Oral Daily   guaiFENesin  1,200 mg Oral BID   insulin aspart  0-5 Units Subcutaneous QHS   insulin aspart  0-9 Units Subcutaneous TID WC   insulin aspart  6 Units Subcutaneous TID WC   insulin detemir  36 Units Subcutaneous BID   isosorbide mononitrate  30 mg Oral Daily   metFORMIN  1,000 mg Oral BID WC   metoCLOPramide  10 mg Oral TID AC & HS   pantoprazole  40 mg Oral Daily   potassium chloride  20 mEq Oral Daily   sacubitril-valsartan  1 tablet Oral BID   sodium chloride flush  3 mL Intravenous Q12H   Continuous Infusions:  sodium chloride     PRN Meds: sodium chloride, alum & mag hydroxide-simeth, magnesium hydroxide, ondansetron (ZOFRAN) IV, oxyCODONE, sodium chloride flush   Vital Signs    Vitals:   04/06/21 2045 04/06/21 2322 04/07/21 0345 04/07/21 0753  BP: (!) 84/68 (!) 96/59 103/63 107/63  Pulse: 84 76 74 79  Resp: 19 18 18 19   Temp: 98.2 F (36.8 C) 98.3 F (36.8 C) 98.1 F (36.7 C) 98.3 F (36.8 C)  TempSrc: Oral Oral Oral Oral  SpO2: 93% 100% 99%   Weight:   124.2 kg   Height:        Intake/Output Summary (Last 24 hours) at 04/07/2021 1032 Last data filed at 04/07/2021 0347 Gross per 24 hour  Intake 200 ml  Output 0 ml  Net 200 ml   Filed Weights    04/05/21 0447 04/06/21 0215 04/07/21 0345  Weight: 129.8 kg 125.3 kg 124.2 kg    Telemetry    Personally Reviewed  ECG    Personally Reviewed  Physical Exam   GEN: No acute distress.   Neck: No  JVD Cardiac: RR,  murmurs, rubs, or gallops. Sternotomy healing well. Respiratory: Clear to auscultation bilaterally. GI: Soft, nontender, non-distended  MS: No  edema; No deformity. Neuro:  Nonfocal  Psych: Normal affect   Labs    Chemistry Recent Labs  Lab 04/01/21 0418 04/02/21 0512 04/03/21 0416 04/03/21 1021 04/04/21 0259 04/06/21 0649  NA 132*   < > 132* 131* 134* 133*  K 4.1   < > 3.9 4.0 3.8 4.2  CL 98   < > 99 97* 98 95*  CO2 25   < > 25 25 26 26   GLUCOSE 161*   < > 208* 268* 108* 114*  BUN 16   < > 21* 20 18 13   CREATININE 1.22   < > 1.04 1.06 1.22 1.09  CALCIUM 8.6*   < > 8.0* 8.6*  8.8* 9.0  PROT 5.7*  --  5.5*  --   --   --   ALBUMIN 2.7*  --  2.4*  --   --   --   AST 24  --  60*  --   --   --   ALT 45*  --  69*  --   --   --   ALKPHOS 54  --  108  --   --   --   BILITOT 0.5  --  0.6  --   --   --   GFRNONAA >60   < > >60 >60 >60 >60  ANIONGAP 9   < > 8 9 10 12    < > = values in this interval not displayed.     Hematology Recent Labs  Lab 04/01/21 0418 04/02/21 0512 04/03/21 0416  WBC 8.2 7.3 5.9  RBC 3.32* 2.98* 2.92*  HGB 10.5* 9.5* 9.3*  HCT 31.8* 28.3* 27.9*  MCV 95.8 95.0 95.5  MCH 31.6 31.9 31.8  MCHC 33.0 33.6 33.3  RDW 13.2 13.0 13.2  PLT 118* 137* 171    Cardiac EnzymesNo results for input(s): TROPONINI in the last 168 hours. No results for input(s): TROPIPOC in the last 168 hours.   BNPNo results for input(s): BNP, PROBNP in the last 168 hours.   DDimer No results for input(s): DDIMER in the last 168 hours.   Radiology    No results found.  Cardiac Studies   Diagnostic Dominance: Right Intervention   Patient Profile     46 y.o. male with new dx of NSTEMI, CAD as above, ischemic cardiomyopathy.  Now post CABG.     Assessment & Plan    CARDIOLOGY RECOMMENDATIONS:  Discharge is anticipated in the next 48 hours. Recommendations for medications and follow up:    Discharge Medications: Continue medications as they are currently listed in the [facility]. Exceptions to the above:   Follow Up: The patient's Primary Cardiologist is Peter [facility], MD (New this admission)  He has two follow up appts with cardiology scheduled and follow up with [facility].    OK to discharge with lifevst.  For questions or updates, please contact [facility] Please consult [email or web address] for contact info under Cardiology/STEMI.   [place] T. Sheria Lang, MD, [facility], [facility] Cardiac Electrophysiology

## 2021-04-07 NOTE — Progress Notes (Signed)
[address]       [place] [address]             [phone]      9 Days Post-Op Procedure(s) (LRB): CORONARY ARTERY BYPASS GRAFTING (CABG) TIMES FIVE, USING LEFT INTERNAL MAMMARY ARTERY, LEFT RADIAL ARTERY, AND ENDOSCOPICALLY HARVESTED RIGHT GREATER SAPHENOUS VEIN (N/A) RADIAL ARTERY HARVEST (Left) TRANSESOPHAGEAL ECHOCARDIOGRAM (TEE) (N/A) ENDOVEIN HARVEST OF GREATER SAPHENOUS VEIN (Right) Subjective:  Feeling better overall. Appetite improved.  Requiring minimal assistance for mobility.  Had a BM.  The Life Vest has been delivered to his room. He feels he is ready for discharge to home today.   Objective: Vital signs in last 24 hours: Temp:  [98.1 F (36.7 C)-98.6 F (37 C)] 98.1 F (36.7 C) (02/11 0345) Pulse Rate:  [74-92] 74 (02/11 0345) Cardiac Rhythm: Normal sinus rhythm (02/10 1918) Resp:  [18-20] 18 (02/11 0345) BP: (84-118)/(47-74) 103/63 (02/11 0345) SpO2:  [93 %-100 %] 99 % (02/11 0345) Weight:  [124.2 kg] 124.2 kg (02/11 0345)  Hemodynamic parameters for last 24 hours:    Intake/Output from previous day: 02/10 0701 - 02/11 0700 In: 200 [P.O.:200] Out: 0  Intake/Output this shift: No intake/output data recorded.  General appearance: alert, cooperative, and no distress Heart: regular rate and rhythm, brief episode of rate-controlled A-fib yesterday.  Lungs: breath sounds clear Abdomen: soft, non render or distended Extremities: 2+ pitting bilat edema LE's Wound: incis healing well  Lab Results: No results for input(s): WBC, HGB, HCT, PLT in the last 72 hours. BMET:  Recent Labs    04/06/21 0649  NA 133*  K 4.2  CL 95*  CO2 26  GLUCOSE 114*  BUN 13  CREATININE 1.09  CALCIUM 9.0     PT/INR:  No results for input(s): LABPROT, INR in the last 72 hours.  ABG    Component Value Date/Time   PHART 7.324 (L) 03/30/2021 0015   HCO3 21.0 03/30/2021 0015   TCO2 22 03/30/2021 0015   ACIDBASEDEF 5.0 (H) 03/30/2021 0015    O2SAT 64.3 04/02/2021 0505   CBG (last 3)  Recent Labs    04/06/21 1645 04/06/21 2109 04/07/21 0613  GLUCAP 110* 175* 81     Meds Scheduled Meds:  amiodarone  200 mg Oral Daily   aspirin EC  81 mg Oral Daily   atorvastatin  80 mg Oral Daily   benzonatate  200 mg Oral TID   bisacodyl  10 mg Oral Daily   Or   bisacodyl  10 mg Rectal Daily   carvedilol  3.125 mg Oral BID WC   Chlorhexidine Gluconate Cloth  6 each Topical Q0600   clopidogrel  75 mg Oral Daily   dapagliflozin propanediol  10 mg Oral Daily   docusate sodium  200 mg Oral Daily   enoxaparin (LOVENOX) injection  40 mg Subcutaneous QHS   furosemide  40 mg Oral Daily   guaiFENesin  1,200 mg Oral BID   insulin aspart  0-5 Units Subcutaneous QHS   insulin aspart  0-9 Units Subcutaneous TID WC   insulin aspart  6 Units Subcutaneous TID WC   insulin detemir  36 Units Subcutaneous BID   isosorbide mononitrate  30 mg Oral Daily   metFORMIN  1,000 mg Oral BID WC   metoCLOPramide  10 mg Oral TID AC & HS   pantoprazole  40 mg Oral Daily   potassium chloride  20 mEq Oral Daily   sacubitril-valsartan  1  tablet Oral BID   sodium chloride flush  3 mL Intravenous Q12H   Continuous Infusions:  sodium chloride     PRN Meds:.sodium chloride, alum & mag hydroxide-simeth, magnesium hydroxide, ondansetron (ZOFRAN) IV, oxyCODONE, sodium chloride flush  Xrays No results found.  Assessment/Plan: S/P Procedure(s) (LRB): CORONARY ARTERY BYPASS GRAFTING (CABG) TIMES FIVE, USING LEFT INTERNAL MAMMARY ARTERY, LEFT RADIAL ARTERY, AND ENDOSCOPICALLY HARVESTED RIGHT GREATER SAPHENOUS VEIN (N/A) RADIAL ARTERY HARVEST (Left) TRANSESOPHAGEAL ECHOCARDIOGRAM (TEE) (N/A) ENDOVEIN HARVEST OF GREATER SAPHENOUS VEIN (Right) POD#8  1 afeb, VSS, sBP 100. Maintaining Sinus rhythm 2 sats good on RA 3 I&O not accurate but weight trending down, cont diuresis 4  K+ 4.2 yesterday 5 DM with acceptable control of BS. Will resume home meds at d/c  with close PMD follow -up 6 Plan for discharge today if cardiology / HF team agree.    LOS: 12 days    Antony Odea PA-C Pager [phone]

## 2021-04-07 NOTE — Progress Notes (Signed)
Mobility Specialist Progress Note:   04/07/21 1300  Mobility  Activity Ambulated with assistance in hallway  Level of Assistance Standby assist, set-up cues, supervision of patient - no hands on  Assistive Device None  Distance Ambulated (ft) 150 ft  Activity Response Tolerated well  $Mobility charge 1 Mobility   Per pt, pt had just ambulated in hallway with RN. Agreed to mobility session. Distance limited secondary to fatigue. Pt back in chair with all needs met.  Nelta Numbers Mobility Specialist  Phone: [phone]

## 2021-04-07 NOTE — Progress Notes (Signed)
D/c tele and IV. Went over AVS with pt and all questions were answered.  ° °Camie Hauss S Djeneba Barsch, RN ° °

## 2021-04-07 NOTE — Progress Notes (Signed)
CARDIAC REHAB PHASE I   PRE:  Rate/Rhythm: 82 SR  BP:  Supine:   Sitting: 103/66  Standing:    SaO2: 97 RA  MODE:  Ambulation: 790 ft   POST:  Rate/Rhythm: 87 SR  BP:  Supine:   Sitting: 121/70  Standing:   SaO2: 99 RA 1055-1130  Pt tolerated ambulation well without walker. Gait steady, slow pace. Pt able to walk 790 feet without c/o. Took two short standing rest stops. VS stable. Pt back to recliner after walk with call light in reach.  Melina Copa RN 04/07/2021 11:39 AM

## 2021-04-11 LAB — ECHO INTRAOPERATIVE TEE
AR max vel: 3.85 cm2
AV Area VTI: 3.56 cm2
AV Area mean vel: 3.79 cm2
AV Mean grad: 1 mmHg
AV Peak grad: 2.6 mmHg
Ao pk vel: 0.81 m/s
Height: 77 in
MV VTI: 3.1 cm2
Weight: 4254.4 oz

## 2021-04-12 NOTE — Progress Notes (Signed)
HEART & VASCULAR TRANSITION OF CARE CONSULT NOTE     Referring Physician:Dr Hendrickson   Primary Care: Dr Jerline Pain  Primary Cardiologist:Dr Ellyn Hack HF MD: Dr Aundra Dubin   HPI: Referred to clinic by Dr Ellyn Hack  for heart failure consultation.   Mr Gene Weeks is a 46 year old with a h/o VT, CAD, S/P CABG , DMII on insulin,  DVT, CVA, PAF, and HFrEF.   Admitted 03/26/21 with chest pain and near syncope in the setting of NSTEMI. Had VT arrest. ECHO EF 20-25%. Cath with severe multivessel CAD. S/P CABG x 5 using LIMA, left radial artery, and saphenous vein. Started on GDMT with the exception of spiro. Discharged with Life Vest. Discharge weight 273 pounds.   He presents for TOC follow up with his fiance. Overall feeling a little stronger every day. He has been setting daily goals to increase activity. Walking a little more everyday. Denies SOB/PND/Orthopnea. Having some nausea.  Appetite improving. No fever or chills. Plans start weighing at home. Wearing Life Vest.No issues with Life Vest.  Taking all medications.   Cardiac Testing  Echo 03/26/21 -EF 20-25%  RV normal  Cath 03/27/2020  Severe Multivessel Occlusive CAD: Large RCA 100% proximal CTO (heavily calcified) -faint left-to-right collaterals to the PDA LCx occluded after OM1 with faint collaterals filling OM2; OM1 has bifurcation 99% stenosis followed by 80% stenosis Small caliber bifurcating Ramus Intermedius with ostial proximal 95% stenosis (likely too small for revascularization) Moderate caliber LAD with diffuse segmental eccentric calcified 55 to 60% proximal stenosis prior to major D1 and mid vessel.  Beyond D1 75% segmental stenosis, and 95% apical Normal LVEDP with severely reduced EF of 20 to 25%  Review of Systems: [y] = yes, [ ]  = no   General: Weight gain [ ] ; Weight loss [ ] ; Anorexia [ ] ; Fatigue [ Y]; Fever [ ] ; Chills [ ] ; Weakness [ ]   Cardiac: Chest pain/pressure [ ] ; Resting SOB [ ] ; Exertional SOB [ ] ; Orthopnea [ ] ;  Pedal Edema [ ] ; Palpitations [ ] ; Syncope [ ] ; Presyncope [ ] ; Paroxysmal nocturnal dyspnea[ ]   Pulmonary: Cough [ ] ; Wheezing[ ] ; Hemoptysis[ ] ; Sputum [ ] ; Snoring [ ]   GI: Vomiting[ ] ; Dysphagia[ ] ; Melena[ ] ; Hematochezia [ ] ; Heartburn[ ] ; Abdominal pain [ ] ; Constipation [ ] ; Diarrhea [ ] ; BRBPR [ ]   GU: Hematuria[ ] ; Dysuria [ ] ; Nocturia[ ]   Vascular: Pain in legs with walking [ ] ; Pain in feet with lying flat [ ] ; Non-healing sores [ ] ; Stroke [Y ]; TIA [ ] ; Slurred speech [ ] ;  Neuro: Headaches[ ] ; Vertigo[ ] ; Seizures[ ] ; Paresthesias[ ] ;Blurred vision [ ] ; Diplopia [ ] ; Vision changes [ ]   Ortho/Skin: Arthritis [ ] ; Joint pain [ Y]; Muscle pain [ ] ; Joint swelling [ ] ; Back Pain [ Y]; Rash [ ]   Psych: Depression[ ] ; Anxiety[ ]   Heme: Bleeding problems [ ] ; Clotting disorders [ ] ; Anemia [ Y]  Endocrine: Diabetes [ Y]; Thyroid dysfunction[ ]    Past Medical History:  Diagnosis Date   C6 cervical fracture ([place])    DIABETES MELLITUS, TYPE II 04/08/2008   DVT (deep venous thrombosis) ([place])    left leg   HYPERTENSION 04/08/2008   Injury of right ulnar nerve 06/07/2015   OSTEOARTHRITIS 04/08/2008   Postconcussion syndrome    Stroke syndrome 02/24/2013   Hospitalized [facility] December 2013 with acute weakness and numbness involving his right side. Treated with TPA with resolution of symptoms within  20 minutes. Normal neuro evaluation and normal MRI     Current Outpatient Medications  Medication Sig Dispense Refill   acetaminophen (TYLENOL) 500 MG tablet Take 1,000 mg by mouth every 6 (six) hours as needed for mild pain, fever or headache.     amiodarone (PACERONE) 200 MG tablet Take 1 tablet (200 mg total) by mouth daily. 30 tablet 2   aspirin 81 MG chewable tablet Chew 81 mg by mouth daily.     atorvastatin (LIPITOR) 80 MG tablet Take 1 tablet (80 mg total) by mouth daily. 30 tablet 2   carvedilol (COREG) 3.125 MG tablet Take 1 tablet (3.125 mg total) by mouth 2  (two) times daily with a meal. 60 tablet 2   clopidogrel (PLAVIX) 75 MG tablet Take 1 tablet (75 mg total) by mouth daily. 90 tablet 11   FARXIGA 10 MG TABS tablet TAKE 1 TABLET(10 MG) BY MOUTH DAILY (Patient taking differently: Take 10 mg by mouth daily.) 30 tablet 5   furosemide (LASIX) 40 MG tablet Take 1 tablet (40 mg total) by mouth daily. 15 tablet 0   glucose blood (ONETOUCH VERIO) test strip USE TO CHECK BLOOD SUGAR THREE TIMES A DAY BEFORE MEALS AND PRN 100 each 12   Insulin Glargine-Lixisenatide (SOLIQUA) 100-33 UNT-MCG/ML SOPN Inject 21 Units into the skin at bedtime.     Insulin Pen Needle 29G X 12.7MM MISC 1 Device by Does not apply route daily. 100 each 11   isosorbide mononitrate (IMDUR) 30 MG 24 hr tablet Take 1 tablet (30 mg total) by mouth daily. 30 tablet 2   metFORMIN (GLUCOPHAGE-XR) 500 MG 24 hr tablet TAKE 2 TABLETS(1000 MG) BY MOUTH TWICE DAILY (Patient taking differently: Take 1,000 mg by mouth 2 (two) times daily.) 180 tablet 3   NOVOLOG FLEXPEN 100 UNIT/ML FlexPen INJECT 8 UNITS IF BLOOD SUGAR 71-140, 12 UNITS BLOOD SUGAR 141-200, 16 UNITS BLOOD SUGAR GREATER THAN 200 (Patient taking differently: Inject 8-16 Units into the skin 3 (three) times daily with meals. Inject 8 units if Blood sugar 71-140 12 units blood sugar 141-200 16 units blood sugar greater than 200) 45 mL 0   ONE TOUCH LANCETS MISC USE TO CHECK BLOOD SUGAR THREE TIMES A BEFORE MEALS AND PRN 100 each 12   oxyCODONE (OXY IR/ROXICODONE) 5 MG immediate release tablet Take 1-2 tablets (5-10 mg total) by mouth every 4 (four) hours as needed for severe pain. 30 tablet 0   potassium chloride SA (KLOR-CON M) 20 MEQ tablet Take 1 tablet (20 mEq total) by mouth daily. 15 tablet 0   sacubitril-valsartan (ENTRESTO) 24-26 MG Take 1 tablet by mouth 2 (two) times daily. 60 tablet 2   sildenafil (VIAGRA) 100 MG tablet Take 0.5-1 tablets (50-100 mg total) by mouth daily as needed for erectile dysfunction. 30 tablet 5   No  current facility-administered medications for this encounter.    Allergies  Allergen Reactions   Amoxicillin Hives      Social History   Socioeconomic History   Marital status: Significant Other    Spouse name: Not on file   Number of children: Not on file   Years of education: Not on file   Highest education level: Not on file  Occupational History   Not on file  Tobacco Use   Smoking status: Never   Smokeless tobacco: Never  Vaping Use   Vaping Use: Never used  Substance and Sexual Activity   Alcohol use: No   Drug use: No  Sexual activity: Not on file  Other Topics Concern   Not on file  Social History Narrative   Not on file   Social Determinants of Health   Financial Resource Strain: Low Risk    Difficulty of Paying Living Expenses: Not very hard  Food Insecurity: No Food Insecurity   Worried About Running Out of Food in the Last Year: Never true   Ran Out of Food in the Last Year: Never true  Transportation Needs: No Transportation Needs   Lack of Transportation (Medical): No   Lack of Transportation (Non-Medical): No  Physical Activity: Not on file  Stress: Not on file  Social Connections: Not on file  Intimate Partner Violence: Not on file      Family History  Problem Relation Age of Onset   Cancer Mother        colon ca   Hypothyroidism Mother    Hyperlipidemia Father    Diabetes Cousin     Vitals:   04/13/21 1021  BP: 102/64  Pulse: 88  SpO2: 100%  Weight: 117.5 kg  Height: 6\' 5"  (1.956 m)   Wt Readings from Last 3 Encounters:  04/13/21 117.5 kg  04/07/21 124.2 kg  06/23/20 133.1 kg    PHYSICAL EXAM: General:  Walked in the clinic with a rolling walker. No respiratory difficulty HEENT: normal Neck: supple. no JVD. Carotids 2+ bilat; no bruits. No lymphadenopathy or thryomegaly appreciated. Cor: PMI nondisplaced. Regular rate & rhythm. No rubs, gallops or murmurs. Lungs: clear Abdomen: soft, nontender, nondistended. No  hepatosplenomegaly. No bruits or masses. Good bowel sounds. Extremities: no cyanosis, clubbing, rash, edema. LUE radial incision approximated.  Neuro: alert & oriented x 3, cranial nerves grossly intact. moves all 4 extremities w/o difficulty. Affect pleasant.  ECG:SR 48 personally reviewed.    ASSESSMENT & PLAN: Chronic HFrEF , ICM  -Echo EF 20-25% RV normal . Plan to repeat ECHO after HF meds optimized.  -NYHA II. Volume status stable. Cut back lasix to 20 mg daily.  - Continue coreg  3.125 mg twice a day  -Continue entresto 24-26 mg twice a day  - Add 12.5 mg spiro dialy . Check BMET and in 7 days  Continue farxiga 10 mg daily   2. CAD-->03/27/21 S/P CABG x5  -Continues to recover.  -On statin, plavix and aspirin.  - Add protonix 40 mg daily  - He has follow up with CT surgery.  - He was referred to cardiac rehab. Encouraged to continue walking daily.  3. DMII  -Hgb AIC 8.2  -Followed y PCP Continue fraxiga 10 mg daily   4. VT  -On amio 200 mg daily  -Next visit check TSH, free T3, free T4.  -Continue Life Vest . Unable to download interrogation.   Referred to HFSW (PCP, Medications, Transportation, ETOH Abuse, Drug Abuse, Insurance, Financial ): No Refer to Pharmacy: Yes Refer to Home Health: No Refer to Advanced Heart Failure Clinic: Yes---> Dr Aundra Dubin   Check CBC,BMET today. Follow up  2-3 weeks with pharmacy, 6 weeks with APP, and 3 months with Dr Aundra Dubin an Echo.   Apryle Stowell NP-C  6:35 PM

## 2021-04-13 ENCOUNTER — Encounter (HOSPITAL_COMMUNITY): Payer: Self-pay

## 2021-04-13 ENCOUNTER — Ambulatory Visit (HOSPITAL_COMMUNITY)
Admission: RE | Admit: 2021-04-13 | Discharge: 2021-04-13 | Disposition: A | Discharge status: Home / Self Care | Payer: BC Managed Care – PPO | Source: Ambulatory Visit | Attending: Adult Health | Admitting: Adult Health

## 2021-04-13 ENCOUNTER — Other Ambulatory Visit: Payer: Self-pay

## 2021-04-13 VITALS — BP 102/64 | HR 88 | Ht 77.0 in | Wt 259.0 lb

## 2021-04-13 DIAGNOSIS — Z794 Long term (current) use of insulin: Secondary | ICD-10-CM | POA: Insufficient documentation

## 2021-04-13 DIAGNOSIS — Z7982 Long term (current) use of aspirin: Secondary | ICD-10-CM | POA: Diagnosis not present

## 2021-04-13 DIAGNOSIS — Z951 Presence of aortocoronary bypass graft: Secondary | ICD-10-CM | POA: Insufficient documentation

## 2021-04-13 DIAGNOSIS — E119 Type 2 diabetes mellitus without complications: Secondary | ICD-10-CM | POA: Diagnosis not present

## 2021-04-13 DIAGNOSIS — I252 Old myocardial infarction: Secondary | ICD-10-CM | POA: Insufficient documentation

## 2021-04-13 DIAGNOSIS — I4729 Other ventricular tachycardia: Secondary | ICD-10-CM

## 2021-04-13 DIAGNOSIS — Z79899 Other long term (current) drug therapy: Secondary | ICD-10-CM | POA: Insufficient documentation

## 2021-04-13 DIAGNOSIS — I5022 Chronic systolic (congestive) heart failure: Secondary | ICD-10-CM | POA: Diagnosis not present

## 2021-04-13 DIAGNOSIS — I2510 Atherosclerotic heart disease of native coronary artery without angina pectoris: Secondary | ICD-10-CM | POA: Diagnosis not present

## 2021-04-13 DIAGNOSIS — I110 Hypertensive heart disease with heart failure: Secondary | ICD-10-CM | POA: Diagnosis not present

## 2021-04-13 DIAGNOSIS — E1159 Type 2 diabetes mellitus with other circulatory complications: Secondary | ICD-10-CM | POA: Diagnosis not present

## 2021-04-13 DIAGNOSIS — I255 Ischemic cardiomyopathy: Secondary | ICD-10-CM | POA: Diagnosis not present

## 2021-04-13 DIAGNOSIS — Z8673 Personal history of transient ischemic attack (TIA), and cerebral infarction without residual deficits: Secondary | ICD-10-CM | POA: Insufficient documentation

## 2021-04-13 DIAGNOSIS — Z7902 Long term (current) use of antithrombotics/antiplatelets: Secondary | ICD-10-CM | POA: Diagnosis not present

## 2021-04-13 DIAGNOSIS — I472 Ventricular tachycardia, unspecified: Secondary | ICD-10-CM | POA: Insufficient documentation

## 2021-04-13 DIAGNOSIS — Z86718 Personal history of other venous thrombosis and embolism: Secondary | ICD-10-CM | POA: Diagnosis not present

## 2021-04-13 LAB — BASIC METABOLIC PANEL
Anion gap: 15 (ref 5–15)
BUN: 20 mg/dL (ref 6–20)
CO2: 15 mmol/L — ABNORMAL LOW (ref 22–32)
Calcium: 8.7 mg/dL — ABNORMAL LOW (ref 8.9–10.3)
Chloride: 98 mmol/L (ref 98–111)
Creatinine, Ser: 1.25 mg/dL — ABNORMAL HIGH (ref 0.61–1.24)
GFR, Estimated: >60 mL/min (ref >60)
Glucose, Bld: 193 mg/dL — ABNORMAL HIGH (ref 70–99)
Potassium: 4.9 mmol/L (ref 3.5–5.1)
Sodium: 128 mmol/L — ABNORMAL LOW (ref 135–145)

## 2021-04-13 MED ORDER — SPIRONOLACTONE 25 MG PO TABS: 12.5000 mg | ORAL_TABLET | Freq: Every day | ORAL | 0 refills | Status: DC

## 2021-04-13 MED ORDER — FUROSEMIDE 40 MG PO TABS: 20.0000 mg | ORAL_TABLET | Freq: Every day | ORAL | 0 refills | Status: DC

## 2021-04-13 MED ORDER — PANTOPRAZOLE SODIUM 40 MG PO TBEC: 40.0000 mg | DELAYED_RELEASE_TABLET | Freq: Every day | ORAL | 5 refills | Status: DC

## 2021-04-13 NOTE — Patient Instructions (Signed)
Nice to see you today  DECREASE Lasix to 20 mg daily  START spironolactone 12.5 mg ( 1/2 tablet) daily  START Protonix 40 mg daily  Labs done today, your results will be available in MyChart, we will contact you for abnormal readings.  Your physician has requested that you have an echocardiogram. Echocardiography is a painless test that uses sound waves to create images of your heart. It provides your doctor with information about the size and shape of your heart and how well your hearts chambers and valves are working. This procedure takes approximately one hour. There are no restrictions for this procedure.   Please follow up with our heart failure pharmacist in 4 weeks  Your physician recommends that you schedule a follow-up appointment in: 8 weeks with app clinic  Your physician recommends that you schedule a follow-up appointment in: 12 weeks with Dr. Shirlee Latch with an echocardiogram  If you have any questions or concerns before your next appointment please send [place] a message through [facility] or call our office at [phone].    TO LEAVE A MESSAGE FOR THE NURSE SELECT OPTION 2, PLEASE LEAVE A MESSAGE INCLUDING: YOUR NAME DATE OF BIRTH CALL BACK NUMBER REASON FOR CALL**this is important as we prioritize the call backs  YOU WILL RECEIVE A CALL BACK THE SAME DAY AS LONG AS YOU CALL BEFORE 4:00 PM  At the Advanced [facility] Clinic, you and your health needs are our priority. As part of our continuing mission to provide you with exceptional heart care, we have created designated Provider Care Teams. These Care Teams include your primary Cardiologist (physician) and Advanced Practice Providers (APPs- Physician Assistants and Nurse Practitioners) who all work together to provide you with the care you need, when you need it.   You may see any of the following providers on your designated Care Team at your next follow up: Dr Arvilla Meres Dr Carron Curie, NP Robbie Lis, [place] [facility] [place], [place] Karle Plumber, PharmD   Please be sure to bring in all your medications bottles to every appointment.   Do the following things EVERYDAY: Weigh yourself in the morning before breakfast. Write it down and keep it in a log. Take your medicines as prescribed Eat low salt foods--Limit salt (sodium) to 2000 mg per day.  Stay as active as you can everyday Limit all fluids for the day to less than 2 liters

## 2021-04-16 ENCOUNTER — Other Ambulatory Visit: Payer: Self-pay | Admitting: Physician Assistant

## 2021-04-16 ENCOUNTER — Other Ambulatory Visit: Payer: Self-pay | Admitting: Family Medicine

## 2021-04-17 NOTE — Progress Notes (Signed)
Cardiology Office Note:    Date:  04/18/2021   ID:  Gene Weeks, DOB September 23, 1975, MRN [id number]  PCP:  [name], [place] Cardiologist: Peter Martinique, MD   Reason for visit: Hospital follow-up post CABG  History of Present Illness:    Gene Weeks is a 46 y.o. male with a hx of hypertension, IDDM, DVT (left), and stroke (2013, treated with TPA with resolution of symptoms) who presented to [facility] ED on 03/26/2021 with complaints of chest pain, nausea, lightheadedness, shortness of breath, "narrowing of vision", and near syncope while driving to work.  Despite this, he went to work but later presented to [facility] ED.  +NSTEMI.  Echo with EF 20-25% and basal inferolateral akinesis.  LHC showed mid to distal LAD stenosis 75%, distal LAD 90% stenosis, 90% stenosed side branch Diagonal 2,  95% stenosis of Ramus Intermediate (small vessel), proximal Circumflex 99% stenosed, and proximal to distal RCA with 100% stenosis.  Patient underwent a CABG x 5.  He had postop A-fib and was placed on amiodarone.  He was discharged with a LifeVest.  Discharge weight 273 pounds.   Today, patient states every day is getting better.  He is walking approximately 400 yards.  His shortness of breath is getting better.  He still tires easily especially after shower.  His appetite is improving.  He has chest soreness.  His incisions are healing well.  He denies lower extremity edema, orthopnea and PND.  Denies palpitations.  He is wearing his LifeVest.    Past Medical History:  Diagnosis Date   C6 cervical fracture ([place])    DIABETES MELLITUS, TYPE II 04/08/2008   DVT (deep venous thrombosis) ([place])    left leg   HYPERTENSION 04/08/2008   Injury of right ulnar nerve 06/07/2015   OSTEOARTHRITIS 04/08/2008   Postconcussion syndrome    Stroke syndrome 02/24/2013   Hospitalized [facility] December 2013 with acute weakness and numbness involving his right side. Treated with TPA  with resolution of symptoms within 20 minutes. Normal neuro evaluation and normal MRI     Past Surgical History:  Procedure Laterality Date   CORONARY ARTERY BYPASS GRAFT N/A 03/29/2021   Procedure: CORONARY ARTERY BYPASS GRAFTING (CABG) TIMES FIVE, USING LEFT INTERNAL MAMMARY ARTERY, LEFT RADIAL ARTERY, AND ENDOSCOPICALLY HARVESTED RIGHT GREATER SAPHENOUS VEIN;  Surgeon: Melrose Nakayama, MD;  Location: [place];  Service: Open Heart Surgery;  Laterality: N/A;   ENDOVEIN HARVEST OF GREATER SAPHENOUS VEIN Right 03/29/2021   Procedure: ENDOVEIN HARVEST OF GREATER SAPHENOUS VEIN;  Surgeon: Melrose Nakayama, MD;  Location: [place];  Service: Open Heart Surgery;  Laterality: Right;   KNEE ARTHROSCOPY     left   LEFT HEART CATH AND CORONARY ANGIOGRAPHY N/A 03/27/2021   Procedure: LEFT HEART CATH AND CORONARY ANGIOGRAPHY;  Surgeon: Leonie Man, MD;  Location: [place] [facility];  Service: Cardiovascular;  Laterality: N/A;   RADIAL ARTERY HARVEST Left 03/29/2021   Procedure: RADIAL ARTERY HARVEST;  Surgeon: Melrose Nakayama, MD;  Location: [place];  Service: Open Heart Surgery;  Laterality: Left;   SHOULDER ARTHROSCOPY     Rotator cuff tear   TEE WITHOUT CARDIOVERSION N/A 03/29/2021   Procedure: TRANSESOPHAGEAL ECHOCARDIOGRAM (TEE);  Surgeon: Melrose Nakayama, MD;  Location: [place];  Service: Open Heart Surgery;  Laterality: N/A;    Current Medications: Current Meds  Medication Sig   acetaminophen (TYLENOL) 500 MG tablet Take 1,000 mg by mouth every 6 (  six) hours as needed for mild pain, fever or headache.   amiodarone (PACERONE) 200 MG tablet Take 1 tablet (200 mg total) by mouth daily.   aspirin 81 MG chewable tablet Chew 81 mg by mouth daily.   atorvastatin (LIPITOR) 80 MG tablet Take 1 tablet (80 mg total) by mouth daily.   carvedilol (COREG) 3.125 MG tablet Take 1 tablet (3.125 mg total) by mouth 2 (two) times daily with a meal.   clopidogrel (PLAVIX) 75 MG tablet Take 1 tablet (75  mg total) by mouth daily.   FARXIGA 10 MG TABS tablet TAKE 1 TABLET(10 MG) BY MOUTH DAILY (Patient taking differently: Take 10 mg by mouth daily.)   furosemide (LASIX) 20 MG tablet Take 20 mg by mouth as needed. Take 0.5 Tablets As Needed For SOB,leg Swelling and for weight gain 3 pounds in a Day and 5 pounds overnight.   glucose blood (ONETOUCH VERIO) test strip USE TO CHECK BLOOD SUGAR THREE TIMES A DAY BEFORE MEALS AND PRN   Insulin Glargine-Lixisenatide (SOLIQUA) 100-33 UNT-MCG/ML SOPN Inject 21 Units into the skin at bedtime.   Insulin Pen Needle 29G X 12.7MM MISC 1 Device by Does not apply route daily.   isosorbide mononitrate (IMDUR) 30 MG 24 hr tablet Take 1 tablet (30 mg total) by mouth daily.   metFORMIN (GLUCOPHAGE-XR) 500 MG 24 hr tablet TAKE 2 TABLETS(1000 MG) BY MOUTH TWICE DAILY   NOVOLOG FLEXPEN 100 UNIT/ML FlexPen INJECT 8 UNITS IF BLOOD SUGAR 71-140, 12 UNITS BLOOD SUGAR 141-200, 16 UNITS BLOOD SUGAR GREATER THAN 200 (Patient taking differently: Inject 8-16 Units into the skin 3 (three) times daily with meals. Inject 8 units if Blood sugar 71-140 12 units blood sugar 141-200 16 units blood sugar greater than 200)   ONE TOUCH LANCETS MISC USE TO CHECK BLOOD SUGAR THREE TIMES A BEFORE MEALS AND PRN   oxyCODONE (OXY IR/ROXICODONE) 5 MG immediate release tablet Take 1-2 tablets (5-10 mg total) by mouth every 4 (four) hours as needed for severe pain.   pantoprazole (PROTONIX) 40 MG tablet Take 1 tablet (40 mg total) by mouth daily.   sacubitril-valsartan (ENTRESTO) 24-26 MG Take 1 tablet by mouth 2 (two) times daily.   sildenafil (VIAGRA) 100 MG tablet Take 0.5-1 tablets (50-100 mg total) by mouth daily as needed for erectile dysfunction.   spironolactone (ALDACTONE) 25 MG tablet Take 0.5 tablets (12.5 mg total) by mouth daily.   [DISCONTINUED] furosemide (LASIX) 40 MG tablet Take 0.5 tablets (20 mg total) by mouth daily.   [DISCONTINUED] potassium chloride SA (KLOR-CON M) 20 MEQ  tablet Take 1 tablet (20 mEq total) by mouth daily.     Allergies:   Amoxicillin   Social History   Socioeconomic History   Marital status: Significant Other    Spouse name: Not on file   Number of children: Not on file   Years of education: Not on file   Highest education level: Not on file  Occupational History   Not on file  Tobacco Use   Smoking status: Never   Smokeless tobacco: Never  Vaping Use   Vaping Use: Never used  Substance and Sexual Activity   Alcohol use: No   Drug use: No   Sexual activity: Not on file  Other Topics Concern   Not on file  Social History Narrative   Not on file   Social Determinants of Health   Financial Resource Strain: Low Risk    Difficulty of Paying Living Expenses: Not very  hard  Food Insecurity: No Food Insecurity   Worried About Charity fundraiser in the Last Year: Never true   Ran Out of Food in the Last Year: Never true  Transportation Needs: No Transportation Needs   Lack of Transportation (Medical): No   Lack of Transportation (Non-Medical): No  Physical Activity: Not on file  Stress: Not on file  Social Connections: Not on file     Family History: The patient's family history includes Cancer in his mother; Diabetes in his cousin; Hyperlipidemia in his father; Hypothyroidism in his mother.  ROS:   Please see the history of present illness.     EKGs/Labs/Other Studies Reviewed:    EKG:  The ekg ordered today demonstrates normal sinus rhythm, inferior infarct, lateral T wave inversions.  Heart rate 97, PR interval 140 ms, QRS duration 96 ms.  Recent Labs: 03/30/2021: Magnesium 1.9 04/03/2021: ALT 69; Hemoglobin 9.3; Platelets 171 04/13/2021: BUN 20; Creatinine, Ser 1.25; Potassium 4.9; Sodium 128   Recent Lipid Panel Lab Results  Component Value Date/Time   CHOL 151 03/27/2021 04:22 AM   TRIG 76 03/27/2021 04:22 AM   HDL 47 03/27/2021 04:22 AM   LDLCALC 89 03/27/2021 04:22 AM    Physical Exam:    VS:  BP 90/70  (BP Location: Right Arm, Patient Position: Sitting, Cuff Size: Large)    Pulse 97    Ht 6\' 5"  (1.956 m)    Wt 254 lb (115.2 kg)    BMI 30.12 kg/m    No data found.  Wt Readings from Last 3 Encounters:  04/18/21 254 lb (115.2 kg)  04/13/21 259 lb (117.5 kg)  04/07/21 273 lb 14.4 oz (124.2 kg)     GEN:  Well nourished, well developed in no acute distress HEENT: Normal NECK: No JVD; No carotid bruits CARDIAC: RRR, no murmurs, rubs, gallops RESPIRATORY:  Clear to auscultation without rales, wheezing or rhonchi  ABDOMEN: Soft, non-tender, non-distended MUSCULOSKELETAL: No edema; No deformity; incisions well healed SKIN: Warm and dry NEUROLOGIC:  Alert and oriented PSYCHIATRIC:  Normal affect     ASSESSMENT AND PLAN    CAD, stable with no angina -status post CABG in March 29, 2021: LIMA to LAD, Left radial artery to posterior descending, SVG to first diagonal, Sequential SVG to ramus intermedius and obtuse marginal, Endoscopic vein harvest right thigh,  Open left radial artery harvest. -EKG with inferior Q waves -Continue beta-blocker, ARB, statin and dual antiplatelet therapy (post NSTEMI) -Encourage cardiac rehab after cleared by CT surgeon.  Ischemic cardiomyopathy, euvolemic -Echo 03/26/2021 with EF 20-25% and basal inferolateral akinesis. -Continue ARB, beta-blocker, spironolactone and Farxiga -Change Lasix to 40 mg 1/2 tablet as needed for shortness of breath, leg swelling or weight gain of 3 pounds in 1 day or 5 pounds in 1 week.  Stop potassium. -Check BMET today with addition of spironolactone last week. -Appt with echo & Dr. Aundra Dubin on 07/16/21 to see if EF has improved with revascularization and heart failure med therapy.  VT arrest -Prior to CABG, pt had VT on tele, received 5 minutes of CPR given amiodarone. -Patient denies palpitations today -Continue LifeVest, BB & amiodarone. -Repeat echo in 3 months as scheduled.  Hyperlipidemia -Goal LDL <55. -LDL 84 in  January 2022.  He was started Lipitor 80.  Recheck fasting lipids in 3 months. -Discussed cholesterol lowering diets - Mediterranean diet, DASH diet, vegetarian diet, low-carbohydrate diet and avoidance of trans fats.  Discussed healthier choice substitutes.  Nuts, high-fiber foods, and  fiber supplements may also improve lipids.    Obesity -Discussed how even a 5-10% weight loss can have cardiovascular benefits.   -Recommend moderate intensity activity for 30 minutes 5 days/week and the DASH diet.  Disposition - Follow-up in 6 months with Dr. Martinique.  He has heart failure and CTS follow-ups in the meanwhile.       Medication Adjustments/Labs and Tests Ordered: Current medicines are reviewed at length with the patient today.  Concerns regarding medicines are outlined above.  Orders Placed This Encounter  Procedures   Basic metabolic panel   EKG [id number]   No orders of the defined types were placed in this encounter.   Patient Instructions  Medication Instructions:  Stop Potassium. Take Lasix 20 mg ( 0.5 Tablets as Needed ). *If you need a refill on your cardiac medications before your next appointment, please call your pharmacy*   Lab Work: BMET If you have labs (blood work) drawn today and your tests are completely normal, you will receive your results only by: [place] (if you have MyChart) OR A paper copy in the mail If you have any lab test that is abnormal or we need to change your treatment, we will call you to review the results.   Testing/Procedures: No Testing   Follow-Up: At [facility], you and your health needs are our priority.  As part of our continuing mission to provide you with exceptional heart care, we have created designated Provider Care Teams.  These Care Teams include your primary Cardiologist (physician) and Advanced Practice Providers (APPs -  Physician Assistants and Nurse Practitioners) who all work together to provide you with the care you  need, when you need it.  We recommend signing up for the patient portal called "MyChart".  Sign up information is provided on this After Visit Summary.  MyChart is used to connect with patients for Virtual Visits (Telemedicine).  Patients are able to view lab/test results, encounter notes, upcoming appointments, etc.  Non-urgent messages can be sent to your provider as well.   To learn more about what you can do with MyChart, go to [email or web address].    Your next appointment:   6 month(s)  The format for your next appointment:   In Person  Provider:   Peter Martinique, MD     Other Instructions Our Fax Number [phone]    Signed, Gaston Islam  04/18/2021 12:35 PM    [place]

## 2021-04-18 ENCOUNTER — Other Ambulatory Visit: Payer: Self-pay

## 2021-04-18 ENCOUNTER — Encounter: Payer: Self-pay | Admitting: Physician Assistant

## 2021-04-18 ENCOUNTER — Ambulatory Visit (INDEPENDENT_AMBULATORY_CARE_PROVIDER_SITE_OTHER): Payer: BC Managed Care – PPO | Admitting: Physician Assistant

## 2021-04-18 VITALS — BP 90/70 | HR 97 | Ht 77.0 in | Wt 254.0 lb

## 2021-04-18 DIAGNOSIS — I2510 Atherosclerotic heart disease of native coronary artery without angina pectoris: Secondary | ICD-10-CM

## 2021-04-18 DIAGNOSIS — I255 Ischemic cardiomyopathy: Secondary | ICD-10-CM

## 2021-04-18 DIAGNOSIS — E785 Hyperlipidemia, unspecified: Secondary | ICD-10-CM

## 2021-04-18 DIAGNOSIS — I4729 Other ventricular tachycardia: Secondary | ICD-10-CM

## 2021-04-18 DIAGNOSIS — Z951 Presence of aortocoronary bypass graft: Secondary | ICD-10-CM | POA: Diagnosis not present

## 2021-04-18 DIAGNOSIS — I5022 Chronic systolic (congestive) heart failure: Secondary | ICD-10-CM

## 2021-04-18 DIAGNOSIS — I10 Essential (primary) hypertension: Secondary | ICD-10-CM

## 2021-04-18 LAB — BASIC METABOLIC PANEL
BUN/Creatinine Ratio: 14 (ref 9–20)
BUN: 17 mg/dL (ref 6–24)
CO2: 20 mmol/L (ref 20–29)
Calcium: 9.6 mg/dL (ref 8.7–10.2)
Chloride: 99 mmol/L (ref 96–106)
Creatinine, Ser: 1.21 mg/dL (ref 0.76–1.27)
Glucose: 163 mg/dL — ABNORMAL HIGH (ref 70–99)
Potassium: 5 mmol/L (ref 3.5–5.2)
Sodium: 135 mmol/L (ref 134–144)
eGFR: 75 mL/min/{1.73_m2} (ref >59)

## 2021-04-18 NOTE — Patient Instructions (Signed)
Medication Instructions:  Stop Potassium. Take Lasix 20 mg ( 0.5 Tablets as Needed ). *If you need a refill on your cardiac medications before your next appointment, please call your pharmacy*   Lab Work: BMET If you have labs (blood work) drawn today and your tests are completely normal, you will receive your results only by: MyChart Message (if you have MyChart) OR A paper copy in the mail If you have any lab test that is abnormal or we need to change your treatment, we will call you to review the results.   Testing/Procedures: No Testing   Follow-Up: At [facility], you and your health needs are our priority.  As part of our continuing mission to provide you with exceptional heart care, we have created designated Provider Care Teams.  These Care Teams include your primary Cardiologist (physician) and Advanced Practice Providers (APPs -  Physician Assistants and Nurse Practitioners) who all work together to provide you with the care you need, when you need it.  We recommend signing up for the patient portal called "MyChart".  Sign up information is provided on this After Visit Summary.  MyChart is used to connect with patients for Virtual Visits (Telemedicine).  Patients are able to view lab/test results, encounter notes, upcoming appointments, etc.  Non-urgent messages can be sent to your provider as well.   To learn more about what you can do with MyChart, go to [email or web address].    Your next appointment:   6 month(s)  The format for your next appointment:   In Person  Provider:   Peter Swaziland, MD     Other Instructions Our Fax Number [phone]

## 2021-04-19 ENCOUNTER — Inpatient Hospital Stay: Payer: BC Managed Care – PPO | Admitting: Family Medicine

## 2021-04-19 ENCOUNTER — Telehealth: Payer: Self-pay

## 2021-04-19 NOTE — Progress Notes (Incomplete)
° °  Gene Weeks is a 46 y.o. male who presents today for an office visit.  Assessment/Plan:  New/Acute Problems: ***  Chronic Problems Addressed Today: No problem-specific Assessment & Plan notes found for this encounter.     Subjective:  HPI:  Patient here for ED follow up. He presented to the ED  with chest pain, nausea, lightheadedness, shortness of breath, "narrowing of vision" and near syncope on 03/26/2021. His symptoms started while he was driving to work. He stayed in the car until his symptoms resolved which took about 10-15 minutes. He was not feeling well upon arrival to work and then went to ED. Had work up in the ED which include EKG and labs. EKG showed sinus rhythm with Twave/ST changes. Troponin level was 196 and then went up to 263. He was ruled in for Non-ST elevation MI. Dr Dorris Fetch was consulted for CABG. He underwent the CABG without any difficulty. He had Post-op Atrial fibrillation and was on Amiodarone. He was discharged with life vest.  He has followed up with cardiologist.         Objective:  Physical Exam: There were no vitals taken for this visit.  Gen: No acute distress, resting comfortably*** CV: Regular rate and rhythm with no murmurs appreciated Pulm: Normal work of breathing, clear to auscultation bilaterally with no crackles, wheezes, or rhonchi Neuro: Grossly normal, moves all extremities Psych: Normal affect and thought content       I,Savera Zaman,acting as a scribe for Jacquiline Doe, MD.,have documented all relevant documentation on the behalf of Jacquiline Doe, MD,as directed by  Jacquiline Doe, MD while in the presence of Jacquiline Doe, MD.   *** [name]. [name], MD 04/19/2021 8:34 AM

## 2021-04-19 NOTE — Telephone Encounter (Addendum)
Called patient regarding results. Left message for patient to call office.----- Message from Cannon Kettle, PA-C sent at 04/18/2021  9:48 PM EST ----- Kidney function & electrolytes normal.  Potassium was on the higher side so it is good that we stopped your potassium supplement.

## 2021-04-19 NOTE — Telephone Encounter (Addendum)
Called patient regarding results. Left message.----- Message from Warren Lacy, PA-C sent at 04/18/2021  9:48 PM EST ----- Kidney function & electrolytes normal.  Potassium was on the higher side so it is good that we stopped your potassium supplement.

## 2021-04-20 ENCOUNTER — Other Ambulatory Visit: Payer: Self-pay | Admitting: Physician Assistant

## 2021-04-23 ENCOUNTER — Telehealth: Payer: Self-pay | Admitting: Cardiology

## 2021-04-23 NOTE — Telephone Encounter (Signed)
04/23/2021  FMLA forms from [employer] put in [place] box to be completed.

## 2021-04-24 ENCOUNTER — Ambulatory Visit: Payer: BC Managed Care – PPO | Admitting: Family Medicine

## 2021-04-24 ENCOUNTER — Other Ambulatory Visit: Payer: Self-pay

## 2021-04-24 ENCOUNTER — Encounter: Payer: Self-pay | Admitting: Family Medicine

## 2021-04-24 VITALS — BP 108/74 | HR 99 | Temp 98.2°F | Ht 77.0 in | Wt 250.2 lb

## 2021-04-24 DIAGNOSIS — E1159 Type 2 diabetes mellitus with other circulatory complications: Secondary | ICD-10-CM

## 2021-04-24 DIAGNOSIS — E1165 Type 2 diabetes mellitus with hyperglycemia: Secondary | ICD-10-CM | POA: Diagnosis not present

## 2021-04-24 DIAGNOSIS — E785 Hyperlipidemia, unspecified: Secondary | ICD-10-CM

## 2021-04-24 DIAGNOSIS — I420 Dilated cardiomyopathy: Secondary | ICD-10-CM | POA: Diagnosis not present

## 2021-04-24 DIAGNOSIS — E1169 Type 2 diabetes mellitus with other specified complication: Secondary | ICD-10-CM

## 2021-04-24 DIAGNOSIS — I25118 Atherosclerotic heart disease of native coronary artery with other forms of angina pectoris: Secondary | ICD-10-CM

## 2021-04-24 DIAGNOSIS — I152 Hypertension secondary to endocrine disorders: Secondary | ICD-10-CM
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
## 2021-04-24 NOTE — Assessment & Plan Note (Signed)
At goal today on spironolactone 12.5 mg daily, Coreg 3.125 mg twice daily, Imdur 30 mg daily, and Entresto 24-26 twice daily.

## 2021-04-24 NOTE — Assessment & Plan Note (Signed)
Continue management per cardiology.  He is on Plavix and Lipitor.  Tolerating well.

## 2021-04-24 NOTE — Patient Instructions (Signed)
It was very nice to see you today!  No medication changes today.  Please come back in 3 months to recheck your A1c.  We will give your handicap placard form today.  Take care, Dr [name]  PLEASE NOTE:  If you had any lab tests please let us know if you have not heard back within a few days. You may see your results on mychart before we have a chance to review them but we will give you a call once they are reviewed by [place]. If we ordered any referrals today, please let us know if you have not heard from their office within the next week.   Please try these tips to maintain a healthy lifestyle:  Eat at least 3 REAL meals and 1-2 snacks per day.  Aim for no more than 5 hours between eating.  If you eat breakfast, please do so within one hour of getting up.   Each meal should contain half fruits/vegetables, one quarter protein, and one quarter carbs (no bigger than a computer mouse)  Cut down on sweet beverages. This includes juice, soda, and sweet tea.   Drink at least 1 glass of water with each meal and aim for at least 8 glasses per day  Exercise at least 150 minutes every week.

## 2021-04-24 NOTE — Assessment & Plan Note (Signed)
Continue management per cardiology.  Tolerating current regimen well.

## 2021-04-24 NOTE — Assessment & Plan Note (Signed)
Last A1c 8.2 a few weeks ago while in the hospital.  His blood sugars have been at goal since being home.  We will continue current regimen Farxiga 10 mg daily, Soliqua 21 units daily, and metformin 1000 mg twice daily.  He will follow-up with me in about 3 months to recheck A1c.  We discussed importance of good glycemic control going forward.

## 2021-04-24 NOTE — Assessment & Plan Note (Signed)
Last LDL 89.  He is on atorvastatin 80 mg daily.  We will continue management per cardiology

## 2021-04-25 ENCOUNTER — Telehealth: Payer: Self-pay

## 2021-04-25 NOTE — Telephone Encounter (Addendum)
Called patient regarding results. Left message for to call office. Letter mailed out.----- Message from Warren Lacy, PA-C sent at 04/18/2021  9:48 PM EST ----- ?Kidney function & electrolytes normal.  Potassium was on the higher side so it is good that we stopped your potassium supplement. ?

## 2021-04-30 ENCOUNTER — Other Ambulatory Visit: Payer: Self-pay | Admitting: Thoracic Surgery (Cardiothoracic Vascular Surgery)

## 2021-04-30 DIAGNOSIS — Z951 Presence of aortocoronary bypass graft: Secondary | ICD-10-CM

## 2021-05-01 ENCOUNTER — Encounter: Payer: Self-pay | Admitting: Thoracic Surgery (Cardiothoracic Vascular Surgery)

## 2021-05-01 ENCOUNTER — Ambulatory Visit (INDEPENDENT_AMBULATORY_CARE_PROVIDER_SITE_OTHER): Payer: Self-pay | Admitting: Thoracic Surgery (Cardiothoracic Vascular Surgery)

## 2021-05-01 ENCOUNTER — Other Ambulatory Visit: Payer: Self-pay

## 2021-05-01 ENCOUNTER — Ambulatory Visit
Admission: RE | Admit: 2021-05-01 | Discharge: 2021-05-01 | Disposition: A | Discharge status: Home / Self Care | Payer: BC Managed Care – PPO | Source: Ambulatory Visit | Attending: Thoracic Surgery (Cardiothoracic Vascular Surgery) | Admitting: Thoracic Surgery (Cardiothoracic Vascular Surgery)

## 2021-05-01 VITALS — BP 99/63 | HR 97 | Resp 20 | Ht 77.0 in | Wt 243.0 lb

## 2021-05-01 DIAGNOSIS — Z951 Presence of aortocoronary bypass graft: Secondary | ICD-10-CM

## 2021-05-01 NOTE — Progress Notes (Signed)
? ?   ?[address] ?      [place] [address] ?            [phone]   ? ? ?HPI: [name] presents for a scheduled follow-up visit after his recent CABG.  Gene Weeks is a 46 year old man with a history of hypertension, insulin-dependent diabetes, DVT, and stroke.  He presented with presyncope and shortness of breath associated with chest pain.  He ruled in for MI.  Catheterization showed severe three-vessel disease and an echocardiogram showed severe left ventricular dysfunction with an EF of 20 to 25%.  He had an episode of VT post catheterization. ? ?He underwent coronary bypass grafting x5 on 03/29/2021.  He had an unremarkable postoperative course although he did require diuresis.  He was discharged on postoperative day #9.  He did go home with a LifeVest. ? ?He continues to have incisional pain.  He is only taking oxycodone about once or twice a week.  He does have pain when he coughs or sneezes.  He has been taking Tylenol regularly.  No anginal type chest pain or syncope but does get dizzy particularly if he is in a warm room. ? ?Past Medical History:  ?Diagnosis Date  ? C6 cervical fracture (HCC)   ? DIABETES MELLITUS, TYPE II 04/08/2008  ? DVT (deep venous thrombosis) ([place])   ? left leg  ? HYPERTENSION 04/08/2008  ? 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 OSTEOARTHRITIS 04/08/2008  ? Postconcussion syndrome   ? Stroke syndrome 02/24/2013  ? Hospitalized [facility] December 2013 with acute weakness and numbness involving his right side. Treated with TPA with resolution of symptoms within 20 minutes. Normal neuro evaluation and normal MRI   ? ? ? ?Current Outpatient Medications  ?Medication Sig Dispense Refill  ? acetaminophen (TYLENOL) 500 MG tablet Take 1,000 mg by mouth every 6 (six) hours as needed for mild pain, fever or headache.    ? amiodarone (PACERONE) 200 MG tablet Take 1 tablet (200 mg total) by mouth daily. 30 tablet 2  ? aspirin 81 MG chewable tablet Chew  81 mg by mouth daily.    ? atorvastatin (LIPITOR) 80 MG tablet Take 1 tablet (80 mg total) by mouth daily. 30 tablet 2  ? carvedilol (COREG) 3.125 MG tablet Take 1 tablet (3.125 mg total) by mouth 2 (two) times daily with a meal. 60 tablet 2  ? clopidogrel (PLAVIX) 75 MG tablet Take 1 tablet (75 mg total) by mouth daily. 90 tablet 11  ? FARXIGA 10 MG TABS tablet TAKE 1 TABLET(10 MG) BY MOUTH DAILY (Patient taking differently: Take 10 mg by mouth daily.) 30 tablet 5  ? furosemide (LASIX) 20 MG tablet Take 20 mg by mouth as needed. Take 0.5 Tablets As Needed For SOB,leg Swelling and for weight gain 3 pounds in a Day and 5 pounds overnight.    ? glucose blood (ONETOUCH VERIO) test strip USE TO CHECK BLOOD SUGAR THREE TIMES A DAY BEFORE MEALS AND PRN 100 each 12  ? Insulin Glargine-Lixisenatide (SOLIQUA) 100-33 UNT-MCG/ML SOPN Inject 21 Units into the skin at bedtime.    ? Insulin Pen Needle 29G X 12.7MM MISC 1 Device by Does not apply route daily. 100 each 11  ? isosorbide mononitrate (IMDUR) 30 MG 24 hr tablet Take 1 tablet (30 mg total) by mouth daily. 30 tablet 2  ? metFORMIN (GLUCOPHAGE-XR) 500 MG 24 hr tablet TAKE 2 TABLETS(1000 MG) BY MOUTH TWICE DAILY  180 tablet 3  ? NOVOLOG FLEXPEN 100 UNIT/ML FlexPen INJECT 8 UNITS IF BLOOD SUGAR 71-140, 12 UNITS BLOOD SUGAR 141-200, 16 UNITS BLOOD SUGAR GREATER THAN 200 (Patient taking differently: Inject 8-16 Units into the skin 3 (three) times daily with meals. Inject 8 units if Blood sugar 71-140 ?12 units blood sugar 141-200 ?16 units blood sugar greater than 200) 45 mL 0  ? ONE TOUCH LANCETS MISC USE TO CHECK BLOOD SUGAR THREE TIMES A BEFORE MEALS AND PRN 100 each 12  ? oxyCODONE (OXY IR/ROXICODONE) 5 MG immediate release tablet Take 1-2 tablets (5-10 mg total) by mouth every 4 (four) hours as needed for severe pain. 30 tablet 0  ? pantoprazole (PROTONIX) 40 MG tablet Take 1 tablet (40 mg total) by mouth daily. 30 tablet 5  ? sacubitril-valsartan (ENTRESTO) 24-26 MG Take  1 tablet by mouth 2 (two) times daily. 60 tablet 2  ? sildenafil (VIAGRA) 100 MG tablet Take 0.5-1 tablets (50-100 mg total) by mouth daily as needed for erectile dysfunction. 30 tablet 5  ? spironolactone (ALDACTONE) 25 MG tablet Take 0.5 tablets (12.5 mg total) by mouth daily. 90 tablet 0  ? ?No current facility-administered medications for this visit.  ? ? ?Physical Exam ?BP 99/63 (BP Location: Right Arm, Patient Position: Sitting, Cuff Size: Large)   Pulse 97   Resp 20   Ht 6\' 5"  (1.956 m)   Wt 243 lb (110.2 kg)   SpO2 96% Comment: RA  BMI 28.82 kg/m?  ?Well-appearing 46 year old man in no acute distress ?Alert and oriented x3 with decreased sensation left lateral third and fourth and fifth fingers ?Motor intact ?Sternum stable, tender to palpation, incision clean and dry ?Left arm incision clean and dry ?Leg incision healing well, no peripheral edema ? ?Diagnostic Tests: ?I personally reviewed the chest x-ray.  There are postoperative changes there is no effusions or infiltrates. ? ?Impression: ?Gene Weeks is a 46 year old man with a history of hypertension, insulin-dependent diabetes, DVT, and stroke.  He presented with a non-ST elevation MI.  Catheterization showed severe three-vessel disease.  Echocardiogram showed severe left ventricular dysfunction with an EF of 20 to 25%.  He had a VT arrest requiring CPR post catheterization.  He underwent coronary bypass grafting x5 on 03/29/2021. ? ?Status post CABG x 5-no recurrent angina.  Still having significant incisional pain but only taking narcotics once or twice a week.  Avoiding using those for the most part due to constipation.  He has been walking on a limited basis but is not able to walk up a flight of stairs yet. ? ?Ischemic cardiomyopathy-well compensated on current medical regimen.  His blood pressure is a smidge on the low side and he does have some dizziness when he is in a warm room.  He has not had to take his Lasix recently.  Will defer  medical management to cardiology. ? ?From surgical standpoint he could drive, but I recommended he check with cardiology to make sure is okay with them given that he is on a LifeVest for a couple more months. ? ?Should not lift anything over 10 pounds for another week and nothing over 20 pounds for another 3 weeks.  Recommended at least 3 months before he tries to return to work.  May take longer. ? ?Plan: ?Return in 1 month to check on progress ? ?Melrose Nakayama, MD ?[facility] ?[phone] ? ? ? ?

## 2021-05-08 NOTE — Telephone Encounter (Signed)
04/24/2021 ? Forms faxed- completed ?

## 2021-05-13 NOTE — Progress Notes (Incomplete)
***In Progress*** ? ?  ?Advanced Heart Failure Clinic Note  ? ?Referring Physician:Dr Roxan Hockey   ?Primary Care: Dr Jerline Pain  ?Primary Cardiologist:Dr Ellyn Hack ?HF MD: Dr Aundra Dubin  ? ?HPI: Referred to clinic by Dr Ellyn Hack  for heart failure consultation.  ?  ?Gene Weeks is a 46 year old with a h/o VT, CAD, S/P CABG , DMII on insulin,  DVT, CVA, PAF, and HFrEF.  ?  ?Admitted 03/26/2021 with chest pain and near syncope in the setting of NSTEMI. Had VT arrest. ECHO showed EF 20-25%. Cath showed severe multivessel CAD. S/P CABG x 5 using LIMA, left radial artery, and saphenous vein. Started on GDMT with the exception of spiro. Discharged with Life Vest. Discharged weight was 273 pounds.  ?  ?He was last seen for [facility] follow-up clinic on 04/13/2021 with his fiance. He reported he felt a little stronger each day overall. He had set daily goals to increase his activity. He stated he walked more each day. Denied SOB/PND/Orthopnea. Reported some nausea.  Appetite was improved. Denied fever or chills. Had planned to weigh at home. Wore Life Vest at this visit and reported no issues. Reported adherence to all medications.  ? ?Seen by cards on 04/18/2021 who stopped his potassium supplementation and set decreased his furosemide to 20 mg as needed.  ? ?Today he returns to HF clinic for pharmacist medication titration. At last visit with MD spironolactone 12.5 mg daily was started. Follow-up BMET was stable. ? ?Overall feeling ***. ?Dizziness, lightheadedness, fatigue:  ?Chest pain or palpitations: ? ?How is your breathing?: *** ?SOB: ?Able to complete all ADLs. Activity level *** ? ?Weight at home pounds. Takes furosemide/torsemide/bumex *** mg *** daily.  ?LEE ?PND/Orthopnea ? ?Appetite *** ?Low-salt diet:  ? ?Physical Exam ?Cost/affordability of meds ? ? ?HF Medications: ?Entresto 24-26 mg BID ?Carvedilol 3.125 mg BID ?Spironolactone 12.5 mg daily ?Farxiga 10 mg daily ?Imdur 30 mg daily ?Furosemide 20 mg daily prn ? ? ?Has the patient  been experiencing any side effects to the medications prescribed?  {YES NO:22349} ? ?Does the patient have any problems obtaining medications due to transportation or finances?   {YES NO:22349} ? ?Understanding of regimen: {excellent/good/fair/poor:19665} ?Understanding of indications: {excellent/good/fair/poor:19665} ?Potential of compliance: {excellent/good/fair/poor:19665} ?Patient understands to avoid NSAIDs. ?Patient understands to avoid decongestants. ?  ? ?Pertinent Lab Values: ?04/18/2021 - Serum creatinine 1.21, BUN 17, Potassium 5.0, Sodium 135,  ? ?Vital Signs: ?Weight: *** (last clinic weight: ***) ?Blood pressure: ***  ?Heart rate: ***  ? ?Assessment/Plan: ?Chronic HFrEF , ICM  ?-Echo EF 20-25% RV normal . Plan to repeat ECHO after HF meds optimized.  ?-NYHA II. Volume status stable. Cut back lasix to 20 mg daily.  ?- Continue coreg  3.125 mg twice a day  ?-Continue entresto 24-26 mg twice a day  ?- Continue 12.5 mg spironolactone daily .  ?Continue farxiga 10 mg daily  ?  ?2. CAD-->03/27/21 S/P CABG x5  ?-Continues to recover.  ?-On statin, Plavix and aspirin.  ?- Continue pantoprazole 40 mg daily  ?- He has follow up with CT surgery.  ?- He was referred to cardiac rehab. Encouraged to continue walking daily. ?  ?3. DMII  ?-Hgb AIC 8.2  ?-Followed y PCP ?Continue fraxiga 10 mg daily  ?  ?4. VT  ?-On amio 200 mg daily  ?-Next visit check TSH, free T3, free T4.  ?-Continue Life Vest . Unable to download interrogation.  ?  ?Check CBC,BMET today. ? ?Follow up 3 Weeks with APP,  and follow up with Dr. Aundra Dubin on 07/16/2021 for Echo. ? ? ?Gene Weeks, PharmD, BCPS, BCCP, CPP ?Heart Failure Clinic Pharmacist ?[phone] ?  ?

## 2021-05-14 ENCOUNTER — Ambulatory Visit (HOSPITAL_COMMUNITY)
Admission: RE | Admit: 2021-05-14 | Discharge: 2021-05-14 | Disposition: A | Discharge status: Home / Self Care | Payer: BC Managed Care – PPO | Source: Ambulatory Visit | Attending: Internal Medicine | Admitting: Internal Medicine

## 2021-05-14 ENCOUNTER — Other Ambulatory Visit: Payer: Self-pay

## 2021-05-14 ENCOUNTER — Telehealth: Payer: Self-pay | Admitting: Cardiology

## 2021-05-14 VITALS — BP 110/76 | HR 96 | Wt 251.0 lb

## 2021-05-14 DIAGNOSIS — Z7901 Long term (current) use of anticoagulants: Secondary | ICD-10-CM | POA: Diagnosis not present

## 2021-05-14 DIAGNOSIS — Z7984 Long term (current) use of oral hypoglycemic drugs: Secondary | ICD-10-CM | POA: Insufficient documentation

## 2021-05-14 DIAGNOSIS — E118 Type 2 diabetes mellitus with unspecified complications: Secondary | ICD-10-CM | POA: Diagnosis not present

## 2021-05-14 DIAGNOSIS — I5022 Chronic systolic (congestive) heart failure: Secondary | ICD-10-CM | POA: Diagnosis not present

## 2021-05-14 DIAGNOSIS — I5020 Unspecified systolic (congestive) heart failure: Secondary | ICD-10-CM | POA: Diagnosis not present

## 2021-05-14 DIAGNOSIS — R0789 Other chest pain: Secondary | ICD-10-CM | POA: Diagnosis not present

## 2021-05-14 DIAGNOSIS — Z794 Long term (current) use of insulin: Secondary | ICD-10-CM | POA: Diagnosis not present

## 2021-05-14 DIAGNOSIS — I252 Old myocardial infarction: Secondary | ICD-10-CM | POA: Diagnosis not present

## 2021-05-14 DIAGNOSIS — I480 Paroxysmal atrial fibrillation: Secondary | ICD-10-CM | POA: Diagnosis not present

## 2021-05-14 DIAGNOSIS — R0602 Shortness of breath: Secondary | ICD-10-CM | POA: Diagnosis not present

## 2021-05-14 DIAGNOSIS — Z79899 Other long term (current) drug therapy: Secondary | ICD-10-CM | POA: Insufficient documentation

## 2021-05-14 DIAGNOSIS — Z09 Encounter for follow-up examination after completed treatment for conditions other than malignant neoplasm: Secondary | ICD-10-CM | POA: Insufficient documentation

## 2021-05-14 DIAGNOSIS — Z86718 Personal history of other venous thrombosis and embolism: Secondary | ICD-10-CM | POA: Diagnosis not present

## 2021-05-14 DIAGNOSIS — Z7982 Long term (current) use of aspirin: Secondary | ICD-10-CM | POA: Diagnosis not present

## 2021-05-14 DIAGNOSIS — Z8673 Personal history of transient ischemic attack (TIA), and cerebral infarction without residual deficits: Secondary | ICD-10-CM | POA: Insufficient documentation

## 2021-05-14 DIAGNOSIS — I2510 Atherosclerotic heart disease of native coronary artery without angina pectoris: Secondary | ICD-10-CM | POA: Insufficient documentation

## 2021-05-14 DIAGNOSIS — I472 Ventricular tachycardia, unspecified: Secondary | ICD-10-CM | POA: Diagnosis not present

## 2021-05-14 DIAGNOSIS — Z7902 Long term (current) use of antithrombotics/antiplatelets: Secondary | ICD-10-CM | POA: Insufficient documentation

## 2021-05-14 DIAGNOSIS — R42 Dizziness and giddiness: Secondary | ICD-10-CM | POA: Diagnosis present

## 2021-05-14 DIAGNOSIS — Z951 Presence of aortocoronary bypass graft: Secondary | ICD-10-CM | POA: Insufficient documentation

## 2021-05-14 DIAGNOSIS — R600 Localized edema: Secondary | ICD-10-CM | POA: Diagnosis not present

## 2021-05-14 MED ORDER — FUROSEMIDE 20 MG PO TABS: 20.0000 mg | ORAL_TABLET | ORAL | 2 refills | Status: DC | PRN

## 2021-05-14 MED ORDER — CARVEDILOL 6.25 MG PO TABS: 6.2500 mg | ORAL_TABLET | Freq: Two times a day (BID) | ORAL | 3 refills | Status: DC

## 2021-05-14 MED ORDER — DAPAGLIFLOZIN PROPANEDIOL 10 MG PO TABS: 10.0000 mg | ORAL_TABLET | Freq: Every day | ORAL | 4 refills | Status: DC

## 2021-05-14 MED ORDER — SPIRONOLACTONE 25 MG PO TABS: 12.5000 mg | ORAL_TABLET | Freq: Every day | ORAL | 4 refills | Status: DC

## 2021-05-14 MED ORDER — SACUBITRIL-VALSARTAN 24-26 MG PO TABS: 1.0000 | ORAL_TABLET | Freq: Two times a day (BID) | ORAL | 12 refills | Status: DC

## 2021-05-14 MED ORDER — ATORVASTATIN CALCIUM 80 MG PO TABS: 80.0000 mg | ORAL_TABLET | Freq: Every day | ORAL | 12 refills | Status: DC

## 2021-05-14 NOTE — Patient Instructions (Signed)
It was a pleasure seeing you today! ? ?MEDICATIONS: ?-We are changing your medications today ?-Increase carvedilol to 6.25 mg (1 tablet) twice daily ?-Call if you have questions about your medications. ? ?LABS: ?-We will call you if your labs need attention. ? ?NEXT APPOINTMENT: ?Return to clinic in 06/15/21 with APP. ? ?In general, to take care of your heart failure: ?-Limit your fluid intake to 2 Liters (half-gallon) per day.   ?-Limit your salt intake to ideally 2-3 grams (2000-3000 mg) per day. ?-Weigh yourself daily and record, and bring that "weight diary" to your next appointment.  (Weight gain of 2-3 pounds in 1 day typically means fluid weight.) ?-The medications for your heart are to help your heart and help you live longer.   ?-Please contact us before stopping any of your heart medications. ? ?Call the clinic at [phone] with questions or to reschedule future appointments.  ?

## 2021-05-14 NOTE — Telephone Encounter (Signed)
Patient is calling about his My Chart message for his cardiac rehab.  He is currently living in [place], [place]. He would like to having were he is living at, he needs to be referred.   ? ?He would also like to know the status of her FMLA forms.  ?

## 2021-05-14 NOTE — Progress Notes (Signed)
?  ?Advanced Heart Failure Clinic Note  ? ?Referring Physician:Dr Roxan Hockey   ?Primary Care: Dr Jerline Pain  ?Primary Cardiologist:Dr Ellyn Hack ?HF MD: Dr Aundra Dubin  ? ?HPI: Referred to clinic by Dr Ellyn Hack for heart failure consultation.  ?  ?Mr Gene Weeks is a 46 year old with a h/o VT, CAD, S/P CABG, DMII on insulin,  DVT, CVA, PAF, and HFrEF.  ?  ?Admitted 03/26/2021 with chest pain and near syncope in the setting of NSTEMI. Had VT arrest. ECHO showed EF 20-25%. Cath showed severe multivessel CAD. S/P CABG x 5 using LIMA, left radial artery, and saphenous vein. Started on GDMT with the exception of spironolactone. Discharged with Life Vest. Discharged weight was 273 pounds.  ?  ?He was last seen for [facility] follow-up clinic on 04/13/2021 with his fiance. He reported he felt a little stronger each day overall. He had set daily goals to increase his activity. He stated he walked more each day. Denied SOB/PND/Orthopnea. Reported some nausea.  Appetite was improved. Denied fever or chills. Had planned to weigh at home. Wore Life Vest at this visit and reported no issues. Reported adherence to all medications.  ? ?Today he returns to HF clinic for pharmacist medication titration. At last visit with APP, spironolactone 12.5 mg daily was started and furosemide was decreased to 20 mg daily. He was later seen by cards on 04/18/2021 where his KCl was stopped and furosemide was decreased to as needed. Overall, he reports feeling much improved since last visit. He reports he is focusing on healing and getting his strength back, and he no longer needs a walker. He states he is sleeping better and going to the gym regularly. Reports dizziness at baseline and "when he gets hot or stands up too fast", but this is improved from his previous visit. States his fatigue has improved day by day. Reports occasional chest pain which he attributes to limited mobility post-surgery and seems to be musculoskeletal in nature. Denies palpitations. Reports his  breathing is much improved. He walks on the treadmill 1/2 mile a day. Able to walk down the block and around the corner. Reports he gets SOB when overexerting or rushing through ADLs but he is able to complete them. Weight at home stable between 241-243 lbs. Takes furosemide 20 mg daily PRN, but he has not needed to take any at all since decreasing to PRN. Trace edema in left leg at baseline. Denies PND/orthopnea. Sleeps on multiple pillows for comfort post surgery. Appetite ok. Does not add extra salt to food, uses Mrs. Dash seasoning. He is wearing his life vest at this visit which has not had any alarms. Of note he is taking Imdur to reduce vasospasms s/p CABG. ? ?HF Medications: ?Carvedilol 3.125 mg BID ?Entresto 24-26 mg BID ?Spironolactone 12.5 mg daily ?Farxiga 10 mg daily ?Imdur 30 mg daily ?Furosemide 20 mg daily prn ? ? ?Has the patient been experiencing any side effects to the medications prescribed? NO ? ?Does the patient have any problems obtaining medications due to transportation or finances?   NO - [place] ? ?Understanding of regimen: good ?Understanding of indications: excellent ?Potential of compliance: good ?Patient understands to avoid NSAIDs. ?Patient understands to avoid decongestants. ?  ? ?Pertinent Lab Values: ?04/18/2021 - Serum creatinine 1.21, BUN 17, Potassium 5.0, Sodium 135 ? ?Vital Signs: ?Weight: 251 lbs. (last clinic weight: 254 lbs.) ?Blood pressure: 110/76  ?Heart rate: 96  ? ?Assessment/Plan: ?Chronic HFrEF, ICM  ?-Echo EF 20-25% RV normal .  Plan to repeat ECHO after HF meds optimized.  ?-NYHA II. Volume status stable. Continue furosemide 20 mg daily PRN. ?- Increase carvedilol to 6.25 mg BID.  ?- Continue Entresto 24-26 mg BID. ?- Continue spironolactone 12.5 mg daily.  ?- Continue Farxiga 10 mg daily.  ?  ?2. CAD-->03/27/2021 S/P CABG x5  ?- Continues to recover.  ?-On statin, Plavix, aspirin, and beta blocker.  ?- Continue pantoprazole 40 mg daily  ?- He has  follow up with CT surgery.  ?- He was referred to cardiac rehab. Will need referral closer to his home of [place], [place]. Encouraged to continue walking daily. ?  ?3. DMII  ?- Hgb AIC 8.2  ?- Managed by PCP. ?  ?4. VT  ?- On amiodarone 200 mg daily  ?- Consider checking TSH, free T3, free T4 at next visit. ?- Continue wearing Life Vest .  ? ? ?Follow up 3 weeks with APP, and follow up with Dr. Aundra Dubin on 07/16/2021 for Echo. ? ? ?Audry Riles, PharmD, BCPS, BCCP, CPP ?Heart Failure Clinic Pharmacist ?[phone] ?  ?

## 2021-05-14 NOTE — Telephone Encounter (Signed)
Returned call to patient left message to call back. 

## 2021-05-15 NOTE — Telephone Encounter (Signed)
Spoke to patient he stated he is currently living in [place] and would like to go to a cardiac rehab in [place] I will reach out to our cardiac rehab director to find out where to go. ?

## 2021-05-16 NOTE — Telephone Encounter (Signed)
05/16/21  ? ? ?Called and spoke with patient regarding FMLA forms. Informed patient that forms were faxed to employer on 04/24/21. Pt understood, and requested a copy of completed forms be mailed to him at the following address. [address] [place], [place] [address]. Forms mailed to this address per patient request.  ?

## 2021-05-16 NOTE — Telephone Encounter (Signed)
Received a call from patient he stated he forgot to ask when will his FMLA paperwork be ready for pick up.Advised I will send message to the person who does FMLA's. ?

## 2021-05-22 ENCOUNTER — Encounter (HOSPITAL_COMMUNITY): Payer: Self-pay | Admitting: Cardiology

## 2021-05-22 ENCOUNTER — Encounter: Payer: Self-pay | Admitting: Cardiology

## 2021-05-23 ENCOUNTER — Telehealth: Payer: Self-pay | Admitting: Family Medicine

## 2021-05-23 NOTE — Telephone Encounter (Signed)
? ?  Patient ?Name: ?Gene Weeks ?Gender: Male ?DOB: Mar 11, 1975 ?Age: 46 Y 64 M 2 D ?Return ?Phone ?Number: ?[phone] ?(Primary) ?Address: ?City/ ?State/ ?Zip: ?[place] ? [address] ?[facility] - ?Clie ?Health visitor at [facility] ?Provider Jacquiline Doe- MD ?Contact Type Call ?Who Is Calling Patient / Member / Family / Caregiver ?Call Type Triage / Clinical ?Relationship To Patient Self ?Return Phone Number [phone] (Primary) ?Chief Complaint Headache ?Reason for Call Symptomatic / Request for Health Information ?Initial Comment Caller states he had heart surgery ago and ?has sinus drainage and headache. He also has ?a itchy throat. He wants to know what allergy ?medicine he can take. ?Translation No ?Nurse Assessment ?Nurse: Marletta Lor, RN, Rosey Bath Date/Time [name] Time): 05/22/2021 9:22:21 PM ?Confirm and document reason for call. If ?symptomatic, describe symptoms. ?---Caller states he had bypass surgery two months ago. ?Has sinus drainage and headache and itchy throat. ?Wants to know if can take allergy / sinus meds - has ?HTN. Denies sore throat or fever. Sounds hoarse. ?Does the patient have any new or worsening ?symptoms? ---Yes ?Will a triage be completed? ---Yes ?Related visit to physician within the last 2 weeks? ---No ?Does the PT have any chronic conditions? (i.e. ?diabetes, asthma, this includes High risk factors for ?pregnancy, etc.) ?---Yes ?List chronic conditions. ---Cardiac / HTN / Diabetes ?Is this a behavioral health or substance abuse call? ---No ?Guidelines ?Guideline Title Affirmed Question Affirmed Notes Nurse Date/Time (Eastern ?Time) ?Sinus Pain or ?Congestion ?[1] Sinus congestion ?as part of a cold AND ?[2] present < 10 days ?Marletta Lor, RN, Rosey Bath 05/22/2021 9:24:53 ?PM ?Disp. Time (Eastern ?Time) Disposition Final User ?05/22/2021 9:28:28 PM Home Care Yes Marletta Lor, RN, Rosey Bath ?Caller Disagree/Comply Comply ?Caller  Understands Yes ?PreDisposition Home Care ?Care Advice Given Per Guideline ?HOME CARE: * You should be able to treat this at home. * Usually home treatment with nasal washes can prevent an actual ?bacterial sinus infection. * Sinus congestion is a normal part of a cold. REASSURANCE AND EDUCATION - COLDS AND ?SINUS CONGESTION: * Antibiotics are not helpful for the sinus congestion that occurs with colds. NASAL WASHES FOR A ?STUFFY NOSE: * How it Helps: The salt water rinses out excess mucus and washes out any irritants (dust, allergens) that might be ?present. It also moistens the nasal cavity. * Introduction: Saline (salt water) nasal irrigation (nasal wash) is an effective and simple ?home remedy for treating stuffy nose and sinus congestion. The nose can be irrigated by pouring, spraying, or squirting salt water into ?the nose and then letting it run back out. NASAL DECONGESTANTS - EXTRA NOTES AND WARNINGS: * Do not use these ?medicines if you have high blood pressure, heart disease, prostate problems, or an overactive thyroid. CALL BACK IF: * Severe ?pain persists over 2 hours after pain medicine * Sinus pain persists over 1 day after using nasal washes * Sinus congestion (fullness) ?persists over 10 days * Fever lasts over 3 days * You become worse CARE ADVICE given per Sinus Pain or Congestion (Adult) ?guideline. ?

## 2021-05-25 NOTE — Telephone Encounter (Signed)
Called patient left message on personal voice mail records faxed to [facility] at fax # [phone].Cardiac rehab will be calling with appointment. ?

## 2021-06-01 ENCOUNTER — Other Ambulatory Visit: Payer: Self-pay | Admitting: Family Medicine

## 2021-06-01 DIAGNOSIS — I5020 Unspecified systolic (congestive) heart failure: Secondary | ICD-10-CM

## 2021-06-04 ENCOUNTER — Telehealth: Payer: Self-pay

## 2021-06-04 NOTE — Telephone Encounter (Signed)
Recommend office visit if he is still having ongoing vomiting. ? ?Gene Weeks. Jerline Pain, MD ?06/04/2021 12:23 PM  ? ?

## 2021-06-04 NOTE — Telephone Encounter (Signed)
Symptomatic / Request for Health Information ?Initial Comment Caller states he has been throwing up 4x times ?today and he has a lot of diarrhea and he would ?like to know what medication he can take and what ?would be safe to to eat to get his energy back. He ?is diabetic and takes other medications as well. ?Translation No ?Nurse Assessment ?Nurse: Terence Lux, RN, Christine Date/Time (Eastern Time): 06/03/2021 6:45:25 PM ?Confirm and document reason for call. If ?symptomatic, describe symptoms. ?---Caller states he has been throwing up 4x times today ?and he has a lot of diarrhea since yesterday and he ?would like to know what medication he can take and ?what would be safe to to eat to get his energy back. He ?is diabetic and takes other medications as well. Not ?checked temp but felt cold at x. ?Does the patient have any new or worsening ?symptoms? ---Yes ?Will a triage be completed? ---Yes ?Related visit to physician within the last 2 weeks? ---No ?Does the PT have any chronic conditions? (i.e. ?diabetes, asthma, this includes High risk factors for ?pregnancy, etc.) ?---Yes ?List chronic conditions. ---dm-2 w/ insulin; open heart sx in feb ?Is this a behavioral health or substance abuse call? ---No ?Guidelines ?Guideline Title Affirmed Question Affirmed Notes Nurse Date/Time (Eastern ?Time) ?Vomiting High-risk adult (e.g., ?diabetes mellitus, ?Terence Lux, RN, ?Christine ?06/03/2021 6:47:40 PM ?PLEASE NOTE: All timestamps contained within this report are represented as [place] Standard Time. ?CONFIDENTIALTY NOTICE: This fax transmission is intended only for the addressee. It contains information that is legally privileged, confidential or ?otherwise protected from use or disclosure. If you are not the intended recipient, you are strictly prohibited from reviewing, disclosing, copying using ?or disseminating any of this information or taking any action in reliance on or regarding this information. If you have received  this fax in error, please ?notify us immediately by telephone so that we can arrange for its return to [place]. Phone: [phone], Toll-Free: [phone], Fax: [phone] ?Page: 2 of 2 ?Call Id: [id number] ?Guidelines ?Guideline Title Affirmed Question Affirmed Notes Nurse Date/Time (Eastern ?Time) ?brain tumor, V-P ?shunt, hernia) ?Disp. Time (Eastern ?Time) Disposition Final User ?06/03/2021 6:53:04 PM Go to ED Now (or PCP triage) Yes Terence Lux, RN, Christine ?Caller Disagree/Comply Comply ?Caller Understands Yes ?PreDisposition Call Doctor ?Care Advice Given Per Guideline ?GO TO ED NOW (OR PCP TRIAGE): * IF NO PCP (PRIMARY CARE PROVIDER) SECOND-LEVEL TRIAGE: You need to be ?seen within the next hour. Go to the [facility] at _____________ [place] as soon as you can. CARE ADVICE per Vomiting ?(Adult) guideline. ?Comments ?User: Hughie Closs, RN Date/Time Eilene Ghazi Time): 06/03/2021 6:51:16 PM ?bsg 249 ?Referrals ?[place]

## 2021-06-04 NOTE — Telephone Encounter (Signed)
Last prescribed by Laurey Morale, MD  ?

## 2021-06-04 NOTE — Telephone Encounter (Signed)
Please see note and advise  

## 2021-06-04 NOTE — Telephone Encounter (Signed)
Left message to return call to our office at their convenience.  

## 2021-06-05 ENCOUNTER — Ambulatory Visit (INDEPENDENT_AMBULATORY_CARE_PROVIDER_SITE_OTHER): Payer: Self-pay | Admitting: Thoracic Surgery (Cardiothoracic Vascular Surgery)

## 2021-06-05 VITALS — BP 112/74 | HR 90 | Resp 20 | Ht 77.0 in | Wt 236.0 lb

## 2021-06-05 DIAGNOSIS — Z951 Presence of aortocoronary bypass graft: Secondary | ICD-10-CM

## 2021-06-05 NOTE — Telephone Encounter (Signed)
Spoke with patient stated feeling a bit better today, had an episode of vomiting last night ?Call cardiology for appointment, if not improving will call PCP to schedule appt  ?

## 2021-06-05 NOTE — Progress Notes (Signed)
? ?   ?[address] ?      [place] [address] ?            [phone]   ? ?HPI: Mr. Gene Weeks returns for a scheduled follow-up visit after recent coronary bypass grafting. ? ?Raesean Bartoletti is a 46 year old man with a history of hypertension, insulin-dependent diabetes, DVT, stroke, CAD, MI, and ischemic cardiomyopathy. ? ?He presented with chest pain associated with shortness of breath and presyncope.  He ruled in for MI.  Catheterization he was found to have three-vessel disease.  Echocardiogram showed severe LV dysfunction with ejection fraction of 20 to 25%.  He had an episode of VT post catheterization. ? ?I did coronary bypass grafting x5 on 03/29/2021.  He required diuresis.  Otherwise made slow but steady progress and went home on day 9.  He was discharged with a LifeVest. ? ?Saw him in the office on 05/01/2021.  He was doing well at that time. ? ?In the interim since his last visit he had been feeling well until a few days ago when he started having some nausea and vomiting and diarrhea.  He is not having any anginal pain.  Does have some incisional soreness which was aggravated by his vomiting.  Denies swelling in his legs. ?Past Medical History:  ?Diagnosis Date  ? C6 cervical fracture (HCC)   ? DIABETES MELLITUS, TYPE II 04/08/2008  ? DVT (deep venous thrombosis) (HCC)   ? left leg  ? HYPERTENSION 04/08/2008  ? 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 OSTEOARTHRITIS 04/08/2008  ? Postconcussion syndrome   ? Stroke syndrome 02/24/2013  ? Hospitalized [facility] December 2013 with acute weakness and numbness involving his right side. Treated with TPA with resolution of symptoms within 20 minutes. Normal neuro evaluation and normal MRI   ? ? ?Current Outpatient Medications  ?Medication Sig Dispense Refill  ? acetaminophen (TYLENOL) 500 MG tablet Take 1,000 mg by mouth every 6 (six) hours as needed for mild pain, fever or headache.    ? amiodarone (PACERONE) 200 MG tablet Take 1  tablet (200 mg total) by mouth daily. 30 tablet 2  ? aspirin 81 MG chewable tablet Chew 81 mg by mouth daily.    ? atorvastatin (LIPITOR) 80 MG tablet Take 1 tablet (80 mg total) by mouth daily. 30 tablet 12  ? carvedilol (COREG) 6.25 MG tablet Take 1 tablet (6.25 mg total) by mouth 2 (two) times daily. 180 tablet 3  ? clopidogrel (PLAVIX) 75 MG tablet Take 1 tablet (75 mg total) by mouth daily. 90 tablet 11  ? FARXIGA 10 MG TABS tablet TAKE 1 TABLET(10 MG) BY MOUTH DAILY 90 tablet 3  ? furosemide (LASIX) 20 MG tablet Take 1 tablet (20 mg total) by mouth as needed for fluid or edema. 30 tablet 2  ? glucose blood (ONETOUCH VERIO) test strip USE TO CHECK BLOOD SUGAR THREE TIMES A DAY BEFORE MEALS AND PRN 100 each 12  ? Insulin Glargine-Lixisenatide (SOLIQUA) 100-33 UNT-MCG/ML SOPN Inject 21 Units into the skin at bedtime.    ? Insulin Pen Needle 29G X 12.7MM MISC 1 Device by Does not apply route daily. 100 each 11  ? isosorbide mononitrate (IMDUR) 30 MG 24 hr tablet Take 1 tablet (30 mg total) by mouth daily. 30 tablet 2  ? metFORMIN (GLUCOPHAGE-XR) 500 MG 24 hr tablet TAKE 2 TABLETS(1000 MG) BY MOUTH TWICE DAILY 180 tablet 3  ? NOVOLOG FLEXPEN 100 UNIT/ML FlexPen INJECT 8  UNITS IF BLOOD SUGAR 71-140, 12 UNITS BLOOD SUGAR 141-200, 16 UNITS BLOOD SUGAR GREATER THAN 200 (Patient taking differently: Inject 8-16 Units into the skin 3 (three) times daily with meals. Inject 8 units if Blood sugar 71-140 ?12 units blood sugar 141-200 ?16 units blood sugar greater than 200) 45 mL 0  ? ONE TOUCH LANCETS MISC USE TO CHECK BLOOD SUGAR THREE TIMES A BEFORE MEALS AND PRN 100 each 12  ? oxyCODONE (OXY IR/ROXICODONE) 5 MG immediate release tablet Take 1-2 tablets (5-10 mg total) by mouth every 4 (four) hours as needed for severe pain. 30 tablet 0  ? pantoprazole (PROTONIX) 40 MG tablet Take 1 tablet (40 mg total) by mouth daily. 30 tablet 5  ? sacubitril-valsartan (ENTRESTO) 24-26 MG Take 1 tablet by mouth 2 (two) times daily. 60  tablet 12  ? sildenafil (VIAGRA) 100 MG tablet Take 0.5-1 tablets (50-100 mg total) by mouth daily as needed for erectile dysfunction. 30 tablet 5  ? spironolactone (ALDACTONE) 25 MG tablet Take 0.5 tablets (12.5 mg total) by mouth daily. 90 tablet 4  ? ?No current facility-administered medications for this visit.  ? ? ?Physical Exam ?BP 112/74   Pulse 90   Resp 20   Ht 6\' 5"  (1.956 m)   Wt 236 lb (107 kg)   SpO2 97% Comment: RA  BMI 27.99 kg/m?  ?46 year old man in no acute distress ?Alert and oriented x3 with no focal deficits ?Lungs clear with equal breath sounds bilaterally ?Cardiac regular rate and rhythm, no rub or murmur ?Sternal incision clean dry and intact, sternum stable, tenderness to palpation fourth and fifth costal cartilage bilaterally ?Left arm incision healing well ?Leg incision healing well, no peripheral edema ? ?Diagnostic Tests: ?none ? ?Impression: ?Gene Weeks is a 46 year old man with a history of hypertension, insulin-dependent diabetes, DVT, and stroke.  He presented with a non-ST elevation MI.  Catheterization he had severe three-vessel disease.  He had an episode of ventricular tachycardia quiring CPR post catheterization.  Ejection fraction of 20 to 25% by echo. ? ?Underwent coronary bypass grafting x5.  He is now about 2 months out from surgery.  No recurrent anginal pain.  No syncope or presyncope.  Exercise tolerance is gradually improving. ? ?At this point there are no restrictions on his activities other than to build into new activities gradually so as to avoid undue pain.  He was cleared to drive by cardiology.  He is okay to drive from my standpoint as well. ? ?VT-LifeVest in place.  Follow-up per cardiology. ? ?Recommended he wait until end of May before returning to work. ? ?Plan: ?Follow-up with cardiology ?I will be happy to see Mr. Borin back at any time in the future if I can be of any further assistance with his care ? ?Arvilla Market, MD ?[facility] ?[phone] ? ? ? ? ?

## 2021-06-06 ENCOUNTER — Other Ambulatory Visit: Payer: Self-pay | Admitting: Family Medicine

## 2021-06-06 NOTE — Telephone Encounter (Signed)
The original prescription was discontinued on 04/07/2021 by Antony Odea, PA-C for the following reason: Stop Taking at Discharge. Renewing this prescription may not be appropriate. ?

## 2021-06-07 ENCOUNTER — Other Ambulatory Visit: Payer: Self-pay | Admitting: Family Medicine

## 2021-06-08 ENCOUNTER — Encounter (HOSPITAL_COMMUNITY): Payer: BC Managed Care – PPO

## 2021-06-14 NOTE — Progress Notes (Signed)
? ?ADVANCED HF CLINIC CONSULT NOTE ? ? ?Primary Care: [name], MD ?Primary Cardiologist: Dr Ellyn Hack ?HF Cardiologist: Dr. Aundra Dubin ? ?HPI: ?Mr Gene Weeks is a 46 year old with a h/o VT, CAD, S/P CABG , DMII on insulin,  DVT, CVA, PAF, and HFrEF.  ?  ?Admitted 03/26/21 with chest pain and near syncope in the setting of NSTEMI. Had VT arrest. ECHO EF 20-25%. Cath with severe multivessel CAD. S/P CABG x 5 using LIMA, left radial artery, and saphenous vein. Started on GDMT with the exception of spiro. Discharged with Life Vest, weight 273 pounds.  ?  ?Follow up in [facility] 2/23, stable NYHA II symptoms, volume status stable. No issues with Life Vest.  ? ?Today he returns for HF follow up. Overall feeling fine. He does not have dyspnea walking on flat ground. Trying to stay active with light weights. Waiting to start CR. Denies abnormal bleeding, palpitations, CP, dizziness, edema, or PND/Orthopnea. Appetite ok. No fever or chills. Weight at home 242-247 pounds. Taking all medications. Has not had any lasix recently. Works as a Technical sales engineer at [employer]. Asking about supplements for his ED, worried to have sex. ? ?  ?Cardiac Testing  ?- Echo (1/23): EF 20-25%  RV normal ?  ?- Cath 03/27/2020  ?Severe Multivessel Occlusive CAD: ?Large RCA 100% proximal CTO (heavily calcified) -faint left-to-right collaterals to the PDA ?LCx occluded after OM1 with faint collaterals filling OM2; OM1 has bifurcation 99% stenosis followed by 80% stenosis ?Small caliber bifurcating Ramus Intermedius with ostial proximal 95% stenosis (likely too small for revascularization) ?Moderate caliber LAD with diffuse segmental eccentric calcified 55 to 60% proximal stenosis prior to major D1 and mid vessel.  Beyond D1 75% segmental stenosis, and 95% apical ?Normal LVEDP with severely reduced EF of 20 to 25%  ? ?Review of Systems: [y] = yes, [ ]  = no  ? ?General: Weight gain [ ] ; Weight loss [ ] ; Anorexia [ ] ; Fatigue [ ] ; Fever [ ] ; Chills [ ] ;  Weakness [ ]   ?Cardiac: Chest pain/pressure [ ] ; Resting SOB [ ] ; Exertional SOB [ ] ; Orthopnea [ ] ; Pedal Edema [ ] ; Palpitations [ ] ; Syncope [ ] ; Presyncope [ ] ; Paroxysmal nocturnal dyspnea[ ]   ?Pulmonary: Cough [ ] ; Wheezing[ ] ; Hemoptysis[ ] ; Sputum [ ] ; Snoring [ ]   ?GI: Vomiting[ ] ; Dysphagia[ ] ; Melena[ ] ; Hematochezia [ ] ; Heartburn[ ] ; Abdominal pain [ ] ; Constipation [ ] ; Diarrhea [ ] ; BRBPR [ ]   ?GU: Hematuria[ ] ; Dysuria [ ] ; Nocturia[ ]   ?Vascular: Pain in legs with walking [ ] ; Pain in feet with lying flat [ ] ; Non-healing sores [ ] ; Stroke Blue.Reese ]; TIA [ ] ; Slurred speech [ ] ;  ?Neuro: Headaches[ ] ; Vertigo[ ] ; Seizures[ ] ; Paresthesias[ ] ;Blurred vision [ ] ; Diplopia [ ] ; Vision changes [ ]   ?Ortho/Skin: Arthritis [ ] ; Joint pain [ ] ; Muscle pain [ ] ; Joint swelling [ ] ; Back Pain [ ] ; Rash [ ]   ?Psych: Depression[ ] ; Anxiety[ ]   ?Heme: Bleeding problems [ ] ; Clotting disorders [ ] ; Anemia Blue.Reese ]  ?Endocrine: Diabetes [ y]; Thyroid dysfunction[ ]  ? ? ?Past Medical History:  ?Diagnosis Date  ? C6 cervical fracture (HCC)   ? DIABETES MELLITUS, TYPE II 04/08/2008  ? DVT (deep venous thrombosis) ([place])   ? left leg  ? HYPERTENSION 04/08/2008  ? 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 OSTEOARTHRITIS 04/08/2008  ? Postconcussion syndrome   ? Stroke syndrome 02/24/2013  ? Hospitalized  [facility] December 2013 with acute weakness and numbness involving his right side. Treated with TPA with resolution of symptoms within 20 minutes. Normal neuro evaluation and normal MRI   ? ? ?Current Outpatient Medications  ?Medication Sig Dispense Refill  ? acetaminophen (TYLENOL) 500 MG tablet Take 1,000 mg by mouth every 6 (six) hours as needed for mild pain, fever or headache.    ? amiodarone (PACERONE) 200 MG tablet Take 1 tablet (200 mg total) by mouth daily. 30 tablet 2  ? aspirin 81 MG chewable tablet Chew 81 mg by mouth daily.    ? atorvastatin (LIPITOR) 80 MG tablet Take 1 tablet (80 mg total) by mouth  daily. 30 tablet 12  ? carvedilol (COREG) 6.25 MG tablet Take 1 tablet (6.25 mg total) by mouth 2 (two) times daily. 180 tablet 3  ? clopidogrel (PLAVIX) 75 MG tablet Take 1 tablet (75 mg total) by mouth daily. 90 tablet 11  ? FARXIGA 10 MG TABS tablet TAKE 1 TABLET(10 MG) BY MOUTH DAILY 90 tablet 3  ? furosemide (LASIX) 20 MG tablet Take 1 tablet (20 mg total) by mouth as needed for fluid or edema. 30 tablet 2  ? glucose blood (ONETOUCH VERIO) test strip USE TO CHECK BLOOD SUGAR THREE TIMES A DAY BEFORE MEALS AND PRN 100 each 12  ? Insulin Glargine-Lixisenatide (SOLIQUA) 100-33 UNT-MCG/ML SOPN Inject 21 Units into the skin at bedtime.    ? Insulin Pen Needle 29G X 12.7MM MISC 1 Device by Does not apply route daily. 100 each 11  ? isosorbide mononitrate (IMDUR) 30 MG 24 hr tablet Take 1 tablet (30 mg total) by mouth daily. 30 tablet 2  ? metFORMIN (GLUCOPHAGE-XR) 500 MG 24 hr tablet TAKE 2 TABLETS(1000 MG) BY MOUTH TWICE DAILY 180 tablet 3  ? NOVOLOG FLEXPEN 100 UNIT/ML FlexPen INJECT 8 UNITS IF BLOOD SUGAR 71-140, 12 UNITS BLOOD SUGAR 141-200, 16 UNITS BLOOD SUGAR GREATER THAN 200 (Patient taking differently: Inject 8-16 Units into the skin 3 (three) times daily with meals. Inject 8 units if Blood sugar 71-140 ?12 units blood sugar 141-200 ?16 units blood sugar greater than 200) 45 mL 0  ? ONE TOUCH LANCETS MISC USE TO CHECK BLOOD SUGAR THREE TIMES A BEFORE MEALS AND PRN 100 each 12  ? oxyCODONE (OXY IR/ROXICODONE) 5 MG immediate release tablet Take 1-2 tablets (5-10 mg total) by mouth every 4 (four) hours as needed for severe pain. 30 tablet 0  ? pantoprazole (PROTONIX) 40 MG tablet Take 1 tablet (40 mg total) by mouth daily. 30 tablet 5  ? sacubitril-valsartan (ENTRESTO) 24-26 MG Take 1 tablet by mouth 2 (two) times daily. 60 tablet 12  ? sildenafil (VIAGRA) 100 MG tablet Take 0.5-1 tablets (50-100 mg total) by mouth daily as needed for erectile dysfunction. 30 tablet 5  ? spironolactone (ALDACTONE) 25 MG tablet  Take 0.5 tablets (12.5 mg total) by mouth daily. 90 tablet 4  ? ?No current facility-administered medications for this encounter.  ? ?Allergies  ?Allergen Reactions  ? Amoxicillin Hives  ? ?Social History  ? ?Socioeconomic History  ? Marital status: Significant Other  ?  Spouse name: Not on file  ? Number of children: Not on file  ? Years of education: Not on file  ? Highest education level: Not on file  ?Occupational History  ? Not on file  ?Tobacco Use  ? Smoking status: Never  ? Smokeless tobacco: Never  ?Vaping Use  ? Vaping Use: Never used  ?  Substance and Sexual Activity  ? Alcohol use: No  ? Drug use: No  ? Sexual activity: Not on file  ?Other Topics Concern  ? Not on file  ?Social History Narrative  ? Not on file  ? ?Social Determinants of Health  ? ?Financial Resource Strain: Low Risk   ? Difficulty of Paying Living Expenses: Not very hard  ?Food Insecurity: No Food Insecurity  ? Worried About Charity fundraiser in the Last Year: Never true  ? Ran Out of Food in the Last Year: Never true  ?Transportation Needs: No Transportation Needs  ? Lack of Transportation (Medical): No  ? Lack of Transportation (Non-Medical): No  ?Physical Activity: Not on file  ?Stress: Not on file  ?Social Connections: Not on file  ?Intimate Partner Violence: Not on file  ? ?Family History  ?Problem Relation Age of Onset  ? Cancer Mother   ?     colon ca  ? Hypothyroidism Mother   ? Hyperlipidemia Father   ? Diabetes Cousin   ? ?BP 130/70   Pulse 86   Wt 114.6 kg (252 lb 9.6 oz)   SpO2 98%   BMI 29.95 kg/m?  ? ?Wt Readings from Last 3 Encounters:  ?06/15/21 114.6 kg (252 lb 9.6 oz)  ?06/05/21 107 kg (236 lb)  ?05/14/21 113.9 kg (251 lb)  ? ?PHYSICAL EXAM: ?General:  NAD. No resp difficulty ?HEENT: Normal ?Neck: Supple. No JVD. Carotids 2+ bilat; no bruits. No lymphadenopathy or thryomegaly appreciated. ?Cor: PMI nondisplaced. Regular rate & rhythm. No rubs, gallops or murmurs. ?Lungs: Clear, Life Vest on ?Abdomen: Soft,  nontender, nondistended. No hepatosplenomegaly. No bruits or masses. Good bowel sounds. ?Extremities: No cyanosis, clubbing, rash, edema ?Neuro: Alert & oriented x 3, cranial nerves grossly intact. Moves all 4 e

## 2021-06-15 ENCOUNTER — Ambulatory Visit (HOSPITAL_COMMUNITY)
Admission: RE | Admit: 2021-06-15 | Discharge: 2021-06-15 | Disposition: A | Discharge status: Home / Self Care | Payer: BC Managed Care – PPO | Source: Ambulatory Visit | Attending: Family Medicine | Admitting: Family Medicine

## 2021-06-15 ENCOUNTER — Encounter (HOSPITAL_COMMUNITY): Payer: Self-pay

## 2021-06-15 VITALS — BP 130/70 | HR 86 | Wt 252.6 lb

## 2021-06-15 DIAGNOSIS — Z79899 Other long term (current) drug therapy: Secondary | ICD-10-CM | POA: Insufficient documentation

## 2021-06-15 DIAGNOSIS — I4729 Other ventricular tachycardia: Secondary | ICD-10-CM

## 2021-06-15 DIAGNOSIS — I110 Hypertensive heart disease with heart failure: Secondary | ICD-10-CM | POA: Diagnosis present

## 2021-06-15 DIAGNOSIS — I480 Paroxysmal atrial fibrillation: Secondary | ICD-10-CM | POA: Diagnosis not present

## 2021-06-15 DIAGNOSIS — I2510 Atherosclerotic heart disease of native coronary artery without angina pectoris: Secondary | ICD-10-CM | POA: Diagnosis not present

## 2021-06-15 DIAGNOSIS — Z7982 Long term (current) use of aspirin: Secondary | ICD-10-CM | POA: Diagnosis not present

## 2021-06-15 DIAGNOSIS — N529 Male erectile dysfunction, unspecified: Secondary | ICD-10-CM

## 2021-06-15 DIAGNOSIS — Z7902 Long term (current) use of antithrombotics/antiplatelets: Secondary | ICD-10-CM | POA: Diagnosis not present

## 2021-06-15 DIAGNOSIS — E119 Type 2 diabetes mellitus without complications: Secondary | ICD-10-CM | POA: Insufficient documentation

## 2021-06-15 DIAGNOSIS — I472 Ventricular tachycardia, unspecified: Secondary | ICD-10-CM | POA: Diagnosis not present

## 2021-06-15 DIAGNOSIS — I5022 Chronic systolic (congestive) heart failure: Secondary | ICD-10-CM | POA: Insufficient documentation

## 2021-06-15 DIAGNOSIS — Z8673 Personal history of transient ischemic attack (TIA), and cerebral infarction without residual deficits: Secondary | ICD-10-CM | POA: Diagnosis not present

## 2021-06-15 DIAGNOSIS — Z794 Long term (current) use of insulin: Secondary | ICD-10-CM | POA: Diagnosis not present

## 2021-06-15 DIAGNOSIS — Z86718 Personal history of other venous thrombosis and embolism: Secondary | ICD-10-CM | POA: Diagnosis not present

## 2021-06-15 DIAGNOSIS — E1159 Type 2 diabetes mellitus with other circulatory complications: Secondary | ICD-10-CM

## 2021-06-15 DIAGNOSIS — Z951 Presence of aortocoronary bypass graft: Secondary | ICD-10-CM | POA: Diagnosis not present

## 2021-06-15 DIAGNOSIS — Z7984 Long term (current) use of oral hypoglycemic drugs: Secondary | ICD-10-CM | POA: Diagnosis not present

## 2021-06-15 LAB — COMPREHENSIVE METABOLIC PANEL
ALT: 20 U/L (ref 0–44)
AST: 20 U/L (ref 15–41)
Albumin: 3.7 g/dL (ref 3.5–5.0)
Alkaline Phosphatase: 61 U/L (ref 38–126)
Anion gap: 9 (ref 5–15)
BUN: 13 mg/dL (ref 6–20)
CO2: 26 mmol/L (ref 22–32)
Calcium: 9.2 mg/dL (ref 8.9–10.3)
Chloride: 103 mmol/L (ref 98–111)
Creatinine, Ser: 0.94 mg/dL (ref 0.61–1.24)
GFR, Estimated: >60 mL/min (ref >60)
Glucose, Bld: 174 mg/dL — ABNORMAL HIGH (ref 70–99)
Potassium: 3.9 mmol/L (ref 3.5–5.1)
Sodium: 138 mmol/L (ref 135–145)
Total Bilirubin: 0.9 mg/dL (ref 0.3–1.2)
Total Protein: 6.3 g/dL — ABNORMAL LOW (ref 6.5–8.1)

## 2021-06-15 LAB — TSH: TSH: 0.691 u[IU]/mL (ref 0.350–4.500)

## 2021-06-15 LAB — T4, FREE: Free T4: 1.14 ng/dL — ABNORMAL HIGH (ref 0.61–1.12)

## 2021-06-15 MED ORDER — ENTRESTO 49-51 MG PO TABS: 1.0000 | ORAL_TABLET | Freq: Two times a day (BID) | ORAL | 11 refills | Status: DC

## 2021-06-15 NOTE — Patient Instructions (Signed)
Labs done today. We will contact you only if your labs are abnormal. ? ?INCREASE Entresto 49-51mg  (1 tablet) by mouth 2 times daily. ? ?DO NOT TAKE IMDUR IF YOU ARE PLANNING ON TAKING VIAGRA ? ?ONLY TAKE LASIX (FLUID/WATER PILL) AS NEEDED IF YOU HAVE: ?-A WEIGHT GAIN OF 3 POUNDS OR MORE IN 24HOURS,  ?-A WEIGHT GAIN OF 5 POUNDS IN 1 WEEK ?-SWELLING ?-SHORTNESS OF BREATH ? ?No other medication changes were made. Please continue all current medications as prescribed. ? ?Your physician recommends that you schedule a follow-up appointment in: 10 days for a lab only appointment and keep your scheduled appointment as scheduled.  ? ?If you have any questions or concerns before your next appointment please send [place] a message through [place] or call our office at [phone].   ? ?TO LEAVE A MESSAGE FOR THE NURSE SELECT OPTION 2, PLEASE LEAVE A MESSAGE INCLUDING: ?YOUR NAME ?DATE OF BIRTH ?CALL BACK NUMBER ?REASON FOR CALL**this is important as we prioritize the call backs ? ?YOU WILL RECEIVE A CALL BACK THE SAME DAY AS LONG AS YOU CALL BEFORE 4:00 PM ? ? ?Do the following things EVERYDAY: ?Weigh yourself in the morning before breakfast. Write it down and keep it in a log. ?Take your medicines as prescribed ?Eat low salt foods--Limit salt (sodium) to 2000 mg per day.  ?Stay as active as you can everyday ?Limit all fluids for the day to less than 2 liters ? ? ?At the [facility], you and your health needs are our priority. As part of our continuing mission to provide you with exceptional heart care, we have created designated Provider Care Teams. These Care Teams include your primary Cardiologist (physician) and Advanced Practice Providers (APPs- Physician Assistants and Nurse Practitioners) who all work together to provide you with the care you need, when you need it.  ? ?You may see any of the following providers on your designated Care Team at your next follow up: ?Dr Glori Bickers ?Dr Loralie Champagne ?Darrick Grinder, NP ?Lyda Jester, PA ?Audry Riles, PharmD ? ? ?Please be sure to bring in all your medications bottles to every appointment.  ? ?

## 2021-06-16 LAB — T3, FREE: T3, Free: 2.5 pg/mL (ref 2.0–4.4)

## 2021-06-20 ENCOUNTER — Other Ambulatory Visit (HOSPITAL_COMMUNITY): Payer: Self-pay | Admitting: Adult Health

## 2021-06-25 ENCOUNTER — Ambulatory Visit (HOSPITAL_COMMUNITY)
Admission: RE | Admit: 2021-06-25 | Discharge: 2021-06-25 | Disposition: A | Discharge status: Home / Self Care | Payer: BC Managed Care – PPO | Source: Ambulatory Visit | Attending: Cardiology | Admitting: Cardiology

## 2021-06-25 DIAGNOSIS — I5022 Chronic systolic (congestive) heart failure: Secondary | ICD-10-CM | POA: Insufficient documentation

## 2021-06-25 LAB — BASIC METABOLIC PANEL
Anion gap: 7 (ref 5–15)
BUN: 8 mg/dL (ref 6–20)
CO2: 27 mmol/L (ref 22–32)
Calcium: 9.4 mg/dL (ref 8.9–10.3)
Chloride: 103 mmol/L (ref 98–111)
Creatinine, Ser: 0.92 mg/dL (ref 0.61–1.24)
GFR, Estimated: >60 mL/min (ref >60)
Glucose, Bld: 203 mg/dL — ABNORMAL HIGH (ref 70–99)
Potassium: 4.4 mmol/L (ref 3.5–5.1)
Sodium: 137 mmol/L (ref 135–145)

## 2021-06-25 MED FILL — Medication: Qty: 1 | Status: AC

## 2021-07-03 ENCOUNTER — Other Ambulatory Visit: Payer: Self-pay | Admitting: Physician Assistant

## 2021-07-05 ENCOUNTER — Other Ambulatory Visit: Payer: Self-pay | Admitting: Physician Assistant

## 2021-07-05 DIAGNOSIS — I5020 Unspecified systolic (congestive) heart failure: Secondary | ICD-10-CM

## 2021-07-09 ENCOUNTER — Telehealth: Payer: Self-pay | Admitting: Cardiology

## 2021-07-09 NOTE — Telephone Encounter (Signed)
See Melissa Maccia's note about beet juice.  Think ok to fly.  ?

## 2021-07-09 NOTE — Telephone Encounter (Signed)
Patient advised and verbalized understanding 

## 2021-07-09 NOTE — Telephone Encounter (Signed)
No known drug interactions with beet juice known. Patient should be aware that there is a decent amount of carbs in the juice. Should not drink more than 1/2 cup per day.  ?

## 2021-07-09 NOTE — Telephone Encounter (Addendum)
Will forward to MD to review 

## 2021-07-09 NOTE — Telephone Encounter (Signed)
Patient was calling in to see if it was okay for him to fly. He will be flying June 21st. Also wants to know if its okay if he does beet juice or beet crystal  ?

## 2021-07-10 ENCOUNTER — Telehealth: Payer: Self-pay | Admitting: Cardiology

## 2021-07-10 MED ORDER — ISOSORBIDE MONONITRATE ER 30 MG PO TB24: 30.0000 mg | ORAL_TABLET | Freq: Every day | ORAL | 2 refills | Status: DC

## 2021-07-10 NOTE — Telephone Encounter (Signed)
Medication refilled

## 2021-07-10 NOTE — Telephone Encounter (Signed)
?*  STAT* If patient is at the pharmacy, call can be transferred to refill team. ? ? ?1. Which medications need to be refilled? (please list name of each medication and dose if known)  ?isosorbide mononitrate (IMDUR) 30 MG 24 hr tablet ? ?2. Which pharmacy/location (including street and city if local pharmacy) is medication to be sent to? ?[id number] - [address], [place] ? ?3. Do they need a 30 day or 90 day supply?  ?90 day supply ? ?Patient states after today he will be completely out of medication. ? ?

## 2021-07-14 ENCOUNTER — Other Ambulatory Visit: Payer: Self-pay | Admitting: Physician Assistant

## 2021-07-16 ENCOUNTER — Ambulatory Visit (HOSPITAL_BASED_OUTPATIENT_CLINIC_OR_DEPARTMENT_OTHER)
Admission: RE | Admit: 2021-07-16 | Discharge: 2021-07-16 | Disposition: A | Discharge status: Home / Self Care | Payer: BC Managed Care – PPO | Source: Ambulatory Visit | Attending: Adult Health | Admitting: Adult Health

## 2021-07-16 ENCOUNTER — Ambulatory Visit (HOSPITAL_COMMUNITY)
Admission: RE | Admit: 2021-07-16 | Discharge: 2021-07-16 | Disposition: A | Discharge status: Home / Self Care | Payer: BC Managed Care – PPO | Source: Ambulatory Visit | Attending: Cardiology | Admitting: Cardiology

## 2021-07-16 VITALS — BP 130/70 | HR 78 | Wt 249.6 lb

## 2021-07-16 DIAGNOSIS — I110 Hypertensive heart disease with heart failure: Secondary | ICD-10-CM | POA: Diagnosis present

## 2021-07-16 DIAGNOSIS — E785 Hyperlipidemia, unspecified: Secondary | ICD-10-CM | POA: Diagnosis not present

## 2021-07-16 DIAGNOSIS — I2510 Atherosclerotic heart disease of native coronary artery without angina pectoris: Secondary | ICD-10-CM | POA: Diagnosis not present

## 2021-07-16 DIAGNOSIS — I5020 Unspecified systolic (congestive) heart failure: Secondary | ICD-10-CM

## 2021-07-16 DIAGNOSIS — I5022 Chronic systolic (congestive) heart failure: Secondary | ICD-10-CM | POA: Diagnosis present

## 2021-07-16 DIAGNOSIS — E119 Type 2 diabetes mellitus without complications: Secondary | ICD-10-CM | POA: Diagnosis not present

## 2021-07-16 DIAGNOSIS — Z951 Presence of aortocoronary bypass graft: Secondary | ICD-10-CM

## 2021-07-16 DIAGNOSIS — I4729 Other ventricular tachycardia: Secondary | ICD-10-CM

## 2021-07-16 LAB — COMPREHENSIVE METABOLIC PANEL
ALT: 24 U/L (ref 0–44)
AST: 24 U/L (ref 15–41)
Albumin: 4 g/dL (ref 3.5–5.0)
Alkaline Phosphatase: 89 U/L (ref 38–126)
Anion gap: 10 (ref 5–15)
BUN: 17 mg/dL (ref 6–20)
CO2: 24 mmol/L (ref 22–32)
Calcium: 9.6 mg/dL (ref 8.9–10.3)
Chloride: 101 mmol/L (ref 98–111)
Creatinine, Ser: 1.13 mg/dL (ref 0.61–1.24)
GFR, Estimated: >60 mL/min (ref >60)
Glucose, Bld: 150 mg/dL — ABNORMAL HIGH (ref 70–99)
Potassium: 4.3 mmol/L (ref 3.5–5.1)
Sodium: 135 mmol/L (ref 135–145)
Total Bilirubin: 0.7 mg/dL (ref 0.3–1.2)
Total Protein: 6.7 g/dL (ref 6.5–8.1)

## 2021-07-16 LAB — LIPID PANEL
Cholesterol: 106 mg/dL (ref 0–200)
HDL: 56 mg/dL (ref >40)
LDL Cholesterol: 40 mg/dL (ref 0–99)
Total CHOL/HDL Ratio: 1.9 RATIO
Triglycerides: 48 mg/dL (ref <150)
VLDL: 10 mg/dL (ref 0–40)

## 2021-07-16 LAB — ECHOCARDIOGRAM COMPLETE
Area-P 1/2: 3.45 cm2
Calc EF: 40.7 %
S' Lateral: 4.2 cm
Single Plane A2C EF: 34.2 %
Single Plane A4C EF: 45.6 %

## 2021-07-16 LAB — TSH: TSH: 1.127 u[IU]/mL (ref 0.350–4.500)

## 2021-07-16 MED ORDER — CARVEDILOL 12.5 MG PO TABS: 6.2500 mg | ORAL_TABLET | Freq: Two times a day (BID) | ORAL | 3 refills | Status: DC

## 2021-07-16 MED ORDER — SPIRONOLACTONE 25 MG PO TABS: 25.0000 mg | ORAL_TABLET | Freq: Every day | ORAL | 3 refills | Status: DC

## 2021-07-16 MED ORDER — AMIODARONE HCL 100 MG PO TABS: 100.0000 mg | ORAL_TABLET | Freq: Every day | ORAL | 3 refills | Status: DC

## 2021-07-16 NOTE — Patient Instructions (Signed)
Medication Changes:  Stop Imdur  Decrease Amiodarone to 100 mg daily  Increase Spironolactone to 25 mg daily  Increase Carvedilol to 12.5 mg Twice daily   Lab Work:  Labs done today, your results will be available in MyChart, we will contact you for abnormal readings.   Testing/Procedures:  Repeat blood work in 10 days   Referrals:  none  Special Instructions // Education:  Discontinue your life vest.  Please put it in the box and called the Company to come collect the vest.   Follow-Up in: 2 months   At the [facility], you and your health needs are our priority. We have a designated team specialized in the treatment of Heart Failure. This Care Team includes your primary Heart Failure Specialized Cardiologist (physician), Advanced Practice Providers (APPs- Physician Assistants and Nurse Practitioners), and Pharmacist who all work together to provide you with the care you need, when you need it.   You may see any of the following providers on your designated Care Team at your next follow up:  Dr Arvilla Meres Dr Carron Curie, NP Robbie Lis, [place] [facility] [place], [place] Karle Plumber, PharmD   Please be sure to bring in all your medications bottles to every appointment.   Need to Contact us:  If you have any questions or concerns before your next appointment please send [place] a message through [place] or call our office at [phone].    TO LEAVE A MESSAGE FOR THE NURSE SELECT OPTION 2, PLEASE LEAVE A MESSAGE INCLUDING: YOUR NAME DATE OF BIRTH CALL BACK NUMBER REASON FOR CALL**this is important as we prioritize the call backs  YOU WILL RECEIVE A CALL BACK THE SAME DAY AS LONG AS YOU CALL BEFORE 4:00 PM

## 2021-07-16 NOTE — Progress Notes (Signed)
Life vest D/C'd on 07/16/2021. Company called and informed of D/C. Instructions given to patient to place in box and contact company for collection

## 2021-07-17 NOTE — Progress Notes (Signed)
PCP: [name], MD HF Cardiology: Dr. Aundra Dubin  46 y.o. with a h/o VT, CAD, S/P CABG , DMII on insulin,  DVT, CVA in 2013 treated with TPA, PAF, and ischemic cardiomyopathy.    Admitted 03/26/21 with chest pain and near syncope in the setting of NSTEMI. Had VT arrest. Echo showed EF 20-25%. Cath with severe multivessel CAD. S/P CABG x 5. Started on GDMT. Discharged with LifeVest.    Echo was done today and reviewed, EF 40-45%, global hypokinesis, mildly decreased RV systolic function.   Patient has been stable clinically.  He is doing cardiac rehab in [place] where his fiance lives.  No exertional chest pain though he has some sternotomy area soreness.  No significant exertional dyspnea.  Mild lightheadedness occasionally if he stands too fast. He is wearing Lifevest, no alarms.   PMH: 1. HTN 2. Type 2 diabetes 3. CAD: NSTEMI + VT arrest in 1/23.   - CABG x 5 with LIMA-LAD, left radial-PDA, SVG-D1, sequential SVG-ramus and OM.  4. VT: VT arrest with NSTEMI in 1/23.  - Lifevest at discharge.  5. Chronic systolic CHF: Ischemic cardiomyopathy.   - Echo (1/23): EF 20-25%, mild LV dilation, normal RV, IVC normal.  - Echo (5/23): EF 40-45%, global hypokinesis, mildly decreased RV systolic function.  6. CVA 2013: Treated with TPA.  7. 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.  Nonsmoker.  Rare ETOH.   FH: Mother with history of PE.  No history of premature CAD.   ROS: All systems reviewed and negative except as per HPI.   Current Outpatient Medications  Medication Sig Dispense Refill   acetaminophen (TYLENOL) 500 MG tablet Take 1,000 mg by mouth every 6 (six) hours as needed for mild pain, fever or headache.     aspirin 81 MG chewable tablet Chew 81 mg by mouth daily.     atorvastatin (LIPITOR) 80 MG tablet Take 1 tablet (80 mg total) by mouth daily. 30 tablet 12   clopidogrel (PLAVIX) 75 MG tablet Take 1 tablet (75 mg total) by  mouth daily. 90 tablet 11   FARXIGA 10 MG TABS tablet TAKE 1 TABLET(10 MG) BY MOUTH DAILY 90 tablet 3   furosemide (LASIX) 20 MG tablet Take 1 tablet (20 mg total) by mouth as needed for fluid or edema. 30 tablet 2   glucose blood (ONETOUCH VERIO) test strip USE TO CHECK BLOOD SUGAR THREE TIMES A DAY BEFORE MEALS AND PRN 100 each 12   Insulin Glargine-Lixisenatide (SOLIQUA) 100-33 UNT-MCG/ML SOPN Inject 21 Units into the skin at bedtime.     Insulin Pen Needle 29G X 12.7MM MISC 1 Device by Does not apply route daily. 100 each 11   metFORMIN (GLUCOPHAGE-XR) 500 MG 24 hr tablet TAKE 2 TABLETS(1000 MG) BY MOUTH TWICE DAILY 180 tablet 3   NOVOLOG FLEXPEN 100 UNIT/ML FlexPen INJECT 8 UNITS IF BLOOD SUGAR 71-140, 12 UNITS BLOOD SUGAR 141-200, 16 UNITS BLOOD SUGAR GREATER THAN 200 (Patient taking differently: Inject 8-16 Units into the skin 3 (three) times daily with meals. Inject 8 units if Blood sugar 71-140 12 units blood sugar 141-200 16 units blood sugar greater than 200) 45 mL 0   ONE TOUCH LANCETS MISC USE TO CHECK BLOOD SUGAR THREE TIMES A BEFORE MEALS AND PRN 100 each 12   oxyCODONE (OXY IR/ROXICODONE) 5 MG immediate release tablet Take 1-2 tablets (5-10 mg total) by mouth every 4 (four) hours as  needed for severe pain. 30 tablet 0   pantoprazole (PROTONIX) 40 MG tablet Take 1 tablet (40 mg total) by mouth daily. 30 tablet 5   sacubitril-valsartan (ENTRESTO) 49-51 MG Take 1 tablet by mouth 2 (two) times daily. 60 tablet 11   sildenafil (VIAGRA) 100 MG tablet Take 0.5-1 tablets (50-100 mg total) by mouth daily as needed for erectile dysfunction. 30 tablet 5   amiodarone (PACERONE) 100 MG tablet Take 1 tablet (100 mg total) by mouth daily. 90 tablet 3   carvedilol (COREG) 12.5 MG tablet Take 0.5 tablets (6.25 mg total) by mouth 2 (two) times daily. 180 tablet 3   spironolactone (ALDACTONE) 25 MG tablet Take 1 tablet (25 mg total) by mouth daily. 90 tablet 3   No current facility-administered  medications for this encounter.   BP 130/70   Pulse 78   Wt 113.2 kg (249 lb 9.6 oz)   SpO2 98%   BMI 29.60 kg/m  General: NAD Neck: No JVD, no thyromegaly or thyroid nodule.  Lungs: Clear to auscultation bilaterally with normal respiratory effort. CV: Nondisplaced PMI.  Heart regular S1/S2, no S3/S4, no murmur.  No peripheral edema.  No carotid bruit.  Normal pedal pulses.  Abdomen: Soft, nontender, no hepatosplenomegaly, no distention.  Skin: Intact without lesions or rashes.  Neurologic: Alert and oriented x 3.  Psych: Normal affect. Extremities: No clubbing or cyanosis.  HEENT: Normal.   Assessment/Plan: 1. Chronic systolic CHF: Ischemic cardiomyopathy.  Echo in 1/23 with EF 20-25%.  Echo today showed EF up to 40-45%.  NYHA class II symptoms. He is not volume overloaded on exam.  - Increase Coreg to 12.5 mg bid.  - Continue Farxiga 10 mg daily.  - Increase spironolactone to 25 mg daily with BMET today and in 10 days.  - Continue Entresto 49/51 bid.  - EF is now out of ICD range, he can take off Lifevest.  - Repeat echo in 6 months.  2. CAD: Premature CAD, but no family history that he knows of.  NSTEMI in 1/23, cath with 3 vessel disease.  Patient with CABG x 5.  No exertional chest pain.  - He can stop Imdur.  - Continue ASA 81 daily.   - Continue Plavix 75 mg daily, continue for at least 1 year post-NSTEMI.  - Continue atorvastatin 80 daily, check lipids today. - Will need aggressive secondary prevention.   3. VT: Patient had VT arrest at time of NSTEMI, now s/p revascularization.  EF is now out of ICD range.  - He can take off Lifevest.  - Decrease amiodarone to 100 mg daily, will likely stop at next appt.  Check LFTs and TSH.  4. CVA: 2013, treated with TPA.   Gene Weeks 07/17/2021

## 2021-07-24 ENCOUNTER — Ambulatory Visit: Payer: BC Managed Care – PPO | Admitting: Family Medicine

## 2021-07-31 ENCOUNTER — Ambulatory Visit: Payer: BC Managed Care – PPO | Admitting: Family Medicine

## 2021-07-31 ENCOUNTER — Encounter: Payer: Self-pay | Admitting: Family Medicine

## 2021-07-31 VITALS — BP 87/59 | HR 81 | Temp 97.5°F | Ht 77.0 in | Wt 245.4 lb

## 2021-07-31 DIAGNOSIS — N529 Male erectile dysfunction, unspecified: Secondary | ICD-10-CM | POA: Diagnosis not present

## 2021-07-31 DIAGNOSIS — E1159 Type 2 diabetes mellitus with other circulatory complications: Secondary | ICD-10-CM

## 2021-07-31 DIAGNOSIS — Z1211 Encounter for screening for malignant neoplasm of colon: Secondary | ICD-10-CM

## 2021-07-31 DIAGNOSIS — E1165 Type 2 diabetes mellitus with hyperglycemia: Secondary | ICD-10-CM | POA: Diagnosis not present

## 2021-07-31 DIAGNOSIS — I10 Essential (primary) hypertension: Secondary | ICD-10-CM

## 2021-07-31 LAB — POCT GLYCOSYLATED HEMOGLOBIN (HGB A1C): Hemoglobin A1C: 6.8 % — AB (ref 4.0–5.6)

## 2021-07-31 MED ORDER — TADALAFIL 20 MG PO TABS
10.0000 mg | ORAL_TABLET | ORAL | 11 refills | Status: DC | PRN
Start: 2021-07-31 — End: 2022-05-07

## 2021-07-31 MED ORDER — SILDENAFIL CITRATE 100 MG PO TABS: 50.0000 mg | ORAL_TABLET | Freq: Every day | ORAL | 5 refills | Status: DC | PRN

## 2021-07-31 MED ORDER — INSULIN ASPART 100 UNIT/ML IJ SOLN: INTRAMUSCULAR | 99 refills | Status: DC

## 2021-07-31 NOTE — Assessment & Plan Note (Signed)
A1c much better at 6.8.  He has not been taking his [place].  He is currently on Farxiga 10 mg daily, metformin 1000 mg twice daily, and sliding scale NovoLog.  Needs a refill on NovoLog today.  We will follow-up in about 3 months.  Will likely benefit from starting GLP-1 agonist soon such as Ozempic or Trulicity.  We can rediscuss at his next office visit.  He does not wish to make any further adjustments today.

## 2021-07-31 NOTE — Assessment & Plan Note (Signed)
He needs refill on Viagra and Cialis.  His blood pressures are on the low side and discussed that he should avoid until he sees cardiology again.

## 2021-07-31 NOTE — Patient Instructions (Signed)
It was very nice to see you today!  Your A1c today is 6.8.  Please talk with your cardiologist about decreasing your blood pressure medications.   We will see you back in 3 months.  Please come back sooner if needed.  Take care, Dr [name]  PLEASE NOTE:  If you had any lab tests please let us know if you have not heard back within a few days. You may see your results on mychart before we have a chance to review them but we will give you a call once they are reviewed by [place]. If we ordered any referrals today, please let us know if you have not heard from their office within the next week.   Please try these tips to maintain a healthy lifestyle:  Eat at least 3 REAL meals and 1-2 snacks per day.  Aim for no more than 5 hours between eating.  If you eat breakfast, please do so within one hour of getting up.   Each meal should contain half fruits/vegetables, one quarter protein, and one quarter carbs (no bigger than a computer mouse)  Cut down on sweet beverages. This includes juice, soda, and sweet tea.   Drink at least 1 glass of water with each meal and aim for at least 8 glasses per day  Exercise at least 150 minutes every week.

## 2021-07-31 NOTE — Assessment & Plan Note (Signed)
Blood pressure is a bit on the low side today.  He is currently on spironolactone 25 mg daily, Coreg 3.125 mg twice daily, and Entresto 24-26 twice daily.  He is having occasional dizziness.  Advised him to schedule an appointment soon with cardiology to discuss adjusting blood pressure meds.

## 2021-07-31 NOTE — Progress Notes (Signed)
   Gene Weeks is a 46 y.o. male who presents today for an office visit.  Assessment/Plan:  Chronic Problems Addressed Today: Type 2 diabetes mellitus with hyperglycemia (HCC) A1c much better at 6.8.  He has not been taking his [place].  He is currently on Farxiga 10 mg daily, metformin 1000 mg twice daily, and sliding scale NovoLog.  Needs a refill on NovoLog today.  We will follow-up in about 3 months.  Will likely benefit from starting GLP-1 agonist soon such as Ozempic or Trulicity.  We can rediscuss at his next office visit.  He does not wish to make any further adjustments today.  Erectile dysfunction He needs refill on Viagra and Cialis.  His blood pressures are on the low side and discussed that he should avoid until he sees cardiology again.   Hypertension associated with diabetes (HCC) Blood pressure is a bit on the low side today.  He is currently on spironolactone 25 mg daily, Coreg 3.125 mg twice daily, and Entresto 24-26 twice daily.  He is having occasional dizziness.  Advised him to schedule an appointment soon with cardiology to discuss adjusting blood pressure meds.    Subjective:  HPI:  See A/p for status of chronic conditions.       Objective:  Physical Exam: BP (!) 87/59   Pulse 81   Temp (!) 97.5 F (36.4 C) (Temporal)   Ht 6\' 5"  (1.956 m)   Wt 245 lb 6.4 oz (111.3 kg)   SpO2 100%   BMI 29.10 kg/m  Wt Readings from Last 3 Encounters:  07/31/21 245 lb 6.4 oz (111.3 kg)  07/16/21 249 lb 9.6 oz (113.2 kg)  06/15/21 252 lb 9.6 oz (114.6 kg)  GEn: No acute distress, resting comfortably CV: Regular rate and rhythm with no murmurs appreciated Pulm: Normal work of breathing, clear to auscultation bilaterally with no crackles, wheezes, or rhonchi Neuro: Grossly normal, moves all extremities Psych: Normal affect and thought content      Cheria Sadiq M. 06/17/21, MD 07/31/2021 3:01 PM

## 2021-08-02 ENCOUNTER — Ambulatory Visit: Payer: BC Managed Care – PPO | Admitting: Family Medicine

## 2021-08-03 ENCOUNTER — Encounter (HOSPITAL_COMMUNITY): Payer: Self-pay | Admitting: *Deleted

## 2021-08-14 ENCOUNTER — Encounter (HOSPITAL_COMMUNITY): Payer: Self-pay | Admitting: Cardiology

## 2021-08-15 ENCOUNTER — Other Ambulatory Visit (HOSPITAL_COMMUNITY): Payer: Self-pay | Admitting: *Deleted

## 2021-08-15 DIAGNOSIS — I5020 Unspecified systolic (congestive) heart failure: Secondary | ICD-10-CM

## 2021-08-15 MED ORDER — CARVEDILOL 12.5 MG PO TABS: 12.5000 mg | ORAL_TABLET | Freq: Two times a day (BID) | ORAL | 3 refills | Status: DC

## 2021-08-23 ENCOUNTER — Telehealth (HOSPITAL_COMMUNITY): Payer: Self-pay | Admitting: *Deleted

## 2021-08-23 DIAGNOSIS — I5020 Unspecified systolic (congestive) heart failure: Secondary | ICD-10-CM

## 2021-08-23 NOTE — Telephone Encounter (Signed)
Pts Cardiac rehab RNcalled to report pt has been experiencing dizziness at cardiac rehab and at home. Bp runs 100/60 but systolic bp can drop into the 64'Q. RN will fax reports to our office.    Routed to Dr.McLean

## 2021-08-25 NOTE — Telephone Encounter (Signed)
Decrease Entresto to 24/26 bid and take spironolactone at night time.  Call in BP readings with this change.

## 2021-08-27 MED ORDER — SPIRONOLACTONE 25 MG PO TABS: 25.0000 mg | ORAL_TABLET | Freq: Every evening | ORAL | 3 refills | Status: DC

## 2021-08-27 MED ORDER — ENTRESTO 24-26 MG PO TABS: 1.0000 | ORAL_TABLET | Freq: Two times a day (BID) | ORAL | 3 refills | Status: DC

## 2021-08-27 NOTE — Addendum Note (Signed)
Addended by: Modesta Messing on: 08/27/2021 02:03 PM   Modules accepted: Orders

## 2021-08-27 NOTE — Telephone Encounter (Signed)
Spoke with pt he is aware and agreeable with plan.  

## 2021-08-31 ENCOUNTER — Encounter (HOSPITAL_COMMUNITY): Payer: Self-pay | Admitting: Cardiology

## 2021-09-03 NOTE — Telephone Encounter (Signed)
Paperwork completed and signed by Dr Shirlee Latch, pt aware and picked up 09/03/21

## 2021-09-17 ENCOUNTER — Encounter (HOSPITAL_COMMUNITY): Payer: Self-pay | Admitting: Cardiology

## 2021-09-17 ENCOUNTER — Ambulatory Visit (HOSPITAL_COMMUNITY)
Admission: RE | Admit: 2021-09-17 | Discharge: 2021-09-17 | Disposition: A | Discharge status: Home / Self Care | Payer: BC Managed Care – PPO | Source: Ambulatory Visit | Attending: Cardiology | Admitting: Cardiology

## 2021-09-17 VITALS — BP 114/76 | HR 95 | Wt 249.4 lb

## 2021-09-17 DIAGNOSIS — I5022 Chronic systolic (congestive) heart failure: Secondary | ICD-10-CM | POA: Diagnosis present

## 2021-09-17 LAB — BASIC METABOLIC PANEL
Anion gap: 8 (ref 5–15)
BUN: 18 mg/dL (ref 6–20)
CO2: 28 mmol/L (ref 22–32)
Calcium: 9.8 mg/dL (ref 8.9–10.3)
Chloride: 103 mmol/L (ref 98–111)
Creatinine, Ser: 1.27 mg/dL — ABNORMAL HIGH (ref 0.61–1.24)
GFR, Estimated: >60 mL/min (ref >60)
Glucose, Bld: 62 mg/dL — ABNORMAL LOW (ref 70–99)
Potassium: 4.2 mmol/L (ref 3.5–5.1)
Sodium: 139 mmol/L (ref 135–145)

## 2021-09-17 LAB — BRAIN NATRIURETIC PEPTIDE: B Natriuretic Peptide: 50.8 pg/mL (ref 0.0–100.0)

## 2021-09-17 NOTE — Patient Instructions (Signed)
Stop amiodarone  Labs done today, your results will be available in MyChart, we will contact you for abnormal readings.   Your physician has requested that you have an echocardiogram. Echocardiography is a painless test that uses sound waves to create images of your heart. It provides your doctor with information about the size and shape of your heart and how well your heart's chambers and valves are working. This procedure takes approximately one hour. There are no restrictions for this procedure.  Your physician recommends that you schedule a follow-up appointment in: 6 weeks with the nurse practitioner  Your physician recommends that you schedule a follow-up appointment in: 4 months with echocardiogram ( November 2023)  ** please call the office in September to arrange your follow up **  If you have any questions or concerns before your next appointment please send [place] a message through [place] or call our office at [phone].    TO LEAVE A MESSAGE FOR THE NURSE SELECT OPTION 2, PLEASE LEAVE A MESSAGE INCLUDING: YOUR NAME DATE OF BIRTH CALL BACK NUMBER REASON FOR CALL**this is important as we prioritize the call backs  YOU WILL RECEIVE A CALL BACK THE SAME DAY AS LONG AS YOU CALL BEFORE 4:00 PM  At the [facility], you and your health needs are our priority. As part of our continuing mission to provide you with exceptional heart care, we have created designated Provider Care Teams. These Care Teams include your primary Cardiologist (physician) and Advanced Practice Providers (APPs- Physician Assistants and Nurse Practitioners) who all work together to provide you with the care you need, when you need it.   You may see any of the following providers on your designated Care Team at your next follow up: Dr Arvilla Meres Dr Carron Curie, NP Robbie Lis, [place] [facility] [place], [place] Karle Plumber, PharmD   Please be sure to bring in all  your medications bottles to every appointment.

## 2021-09-18 NOTE — Progress Notes (Signed)
PCP: Ardith Dark, MD HF Cardiology: Dr. Shirlee Latch  46 y.o. with a h/o VT, CAD, S/P CABG , DMII on insulin,  DVT, CVA in 2013 treated with TPA, PAF, and ischemic cardiomyopathy.    Admitted 03/26/21 with chest pain and near syncope in the setting of NSTEMI. Had VT arrest. Echo showed EF 20-25%. Cath with severe multivessel CAD. S/P CABG x 5. Started on GDMT. Discharged with LifeVest.    Echo in 5/23 showed EF 40-45%, global hypokinesis, mildly decreased RV systolic function.   Patient decreased Entresto to 24/26 bid due to lightheadedness and soft BP.   Patient returns for followup of CHF and CAD.  He is doing cardiac rehab in [place] where his fiance lives.  No exertional chest pain.  He still wears out a lot faster than prior to MI. He is tired after 5 minutes on the elliptical, generally does ok walking on treadmill.  He is tired after climbing a flight of stairs.  No dyspnea walking on flat ground.  Rarely gets lightheaded now if he stands too fast.  Works as Systems developer at [employer], trying to decide whether he is going to be able to go back to this in mid-August. Weight is stable.   ECG (personally reviewed): NSR, old inferior MI  Labs (5/23): LDL 40, HDL 56 Labs (6/23): K 4.1, creatinine 0.94  PMH: 1. HTN 2. Type 2 diabetes 3. CAD: NSTEMI + VT arrest in 1/23.   - CABG x 5 with LIMA-LAD, left radial-PDA, SVG-D1, sequential SVG-ramus and OM.  4. VT: VT arrest with NSTEMI in 1/23.  - Lifevest at discharge.  5. Chronic systolic CHF: Ischemic cardiomyopathy.   - Echo (1/23): EF 20-25%, mild LV dilation, normal RV, IVC normal.  - Echo (5/23): EF 40-45%, global hypokinesis, mildly decreased RV systolic function.  6. CVA 2013: Treated with TPA.  7. 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  Nonsmoker.  Rare ETOH.   FH: Mother with history of PE.  No history of premature CAD.   ROS: All systems reviewed and negative except as  per HPI.   Current Outpatient Medications  Medication Sig Dispense Refill   acetaminophen (TYLENOL) 500 MG tablet Take 1,000 mg by mouth every 6 (six) hours as needed for mild pain, fever or headache.     aspirin 81 MG chewable tablet Chew 81 mg by mouth daily.     atorvastatin (LIPITOR) 80 MG tablet Take 1 tablet (80 mg total) by mouth daily. 30 tablet 12   carvedilol (COREG) 12.5 MG tablet Take 1 tablet (12.5 mg total) by mouth 2 (two) times daily. 180 tablet 3   clopidogrel (PLAVIX) 75 MG tablet Take 1 tablet (75 mg total) by mouth daily. 90 tablet 11   FARXIGA 10 MG TABS tablet TAKE 1 TABLET(10 MG) BY MOUTH DAILY 90 tablet 3   furosemide (LASIX) 20 MG tablet Take 1 tablet (20 mg total) by mouth as needed for fluid or edema. 30 tablet 2   glucose blood (ONETOUCH VERIO) test strip USE TO CHECK BLOOD SUGAR THREE TIMES A DAY BEFORE MEALS AND PRN 100 each 12   insulin aspart (NOVOLOG) 100 UNIT/ML injection INJECT 8 UNITS IF BLOOD SUGAR 71-140, 12 UNITS BLOOD SUGAR 141-200, 16 UNITS BLOOD SUGAR GREATER THAN 200 10 mL PRN   Insulin Pen Needle 29G X 12.7MM MISC 1 Device by Does not apply route daily. 100 each 11  metFORMIN (GLUCOPHAGE-XR) 500 MG 24 hr tablet TAKE 2 TABLETS(1000 MG) BY MOUTH TWICE DAILY 180 tablet 3   ONE TOUCH LANCETS MISC USE TO CHECK BLOOD SUGAR THREE TIMES A BEFORE MEALS AND PRN 100 each 12   oxyCODONE (OXY IR/ROXICODONE) 5 MG immediate release tablet Take 1-2 tablets (5-10 mg total) by mouth every 4 (four) hours as needed for severe pain. 30 tablet 0   pantoprazole (PROTONIX) 40 MG tablet Take 1 tablet (40 mg total) by mouth daily. 30 tablet 5   sacubitril-valsartan (ENTRESTO) 24-26 MG Take 1 tablet by mouth 2 (two) times daily. 60 tablet 3   sildenafil (VIAGRA) 100 MG tablet Take 0.5-1 tablets (50-100 mg total) by mouth daily as needed for erectile dysfunction. 30 tablet 5   spironolactone (ALDACTONE) 25 MG tablet Take 1 tablet (25 mg total) by mouth every evening. 90 tablet 3    tadalafil (CIALIS) 20 MG tablet Take 0.5-1 tablets (10-20 mg total) by mouth every other day as needed for erectile dysfunction. 10 tablet 11   No current facility-administered medications for this encounter.   BP 114/76   Pulse 95   Wt 113.1 kg (249 lb 6.4 oz)   SpO2 98%   BMI 29.57 kg/m  General: NAD Neck: No JVD, no thyromegaly or thyroid nodule.  Lungs: Clear to auscultation bilaterally with normal respiratory effort. CV: Nondisplaced PMI.  Heart regular S1/S2, no S3/S4, no murmur.  No peripheral edema.  No carotid bruit.  Normal pedal pulses.  Abdomen: Soft, nontender, no hepatosplenomegaly, no distention.  Skin: Intact without lesions or rashes.  Neurologic: Alert and oriented x 3.  Psych: Normal affect. Extremities: No clubbing or cyanosis.  HEENT: Normal.   Assessment/Plan: 1. Chronic systolic CHF: Ischemic cardiomyopathy.  Echo in 1/23 with EF 20-25%.  Echo in 5/23 showed EF up to 40-45%.  Not volume overloaded on exam.  BP still runs relatively low and he fatigues easily, NYHA class II.    - Continue Coreg 12.5 mg bid.  - Continue Farxiga 10 mg daily.  - Continue spironolactone 25 mg daily.  BMET today.  - Continue Entresto 24/26 bid, decreased due to orthostatic symptoms which have improved.  - EF was out of ICD range on 5/23 echo, repeat echo 6 months from that time in 11/23.  2. CAD: Premature CAD, but no family history that he knows of.  NSTEMI in 1/23, cath with 3 vessel disease.  Patient with CABG x 5.  No exertional chest pain.  - Continue ASA 81 daily.   - Continue Plavix 75 mg daily, continue for at least 1 year post-NSTEMI.  - Continue atorvastatin 80 daily, good lipids in 5/23.  - Will need aggressive secondary prevention.   3. VT: Patient had VT arrest at time of NSTEMI, now s/p revascularization.  EF is now out of ICD range.  - No further arrhythmias have been noted and EF has made some improvement, think he can stop amiodarone.  4. CVA: 2013, treated with  TPA.   Followup in 6 wks with APP.   Marca Ancona 09/18/2021

## 2021-10-05 ENCOUNTER — Other Ambulatory Visit (HOSPITAL_COMMUNITY): Payer: Self-pay | Admitting: Adult Health

## 2021-10-05 ENCOUNTER — Other Ambulatory Visit: Payer: Self-pay | Admitting: Cardiology

## 2021-10-08 ENCOUNTER — Encounter (HOSPITAL_COMMUNITY): Payer: Self-pay

## 2021-10-08 ENCOUNTER — Encounter (HOSPITAL_COMMUNITY): Payer: Self-pay | Admitting: *Deleted

## 2021-10-20 ENCOUNTER — Encounter: Payer: Self-pay | Admitting: Family Medicine

## 2021-10-22 ENCOUNTER — Other Ambulatory Visit: Payer: Self-pay | Admitting: *Deleted

## 2021-10-22 MED ORDER — METFORMIN HCL ER 500 MG PO TB24: 1000.0000 mg | ORAL_TABLET | Freq: Two times a day (BID) | ORAL | 3 refills | Status: DC

## 2021-10-30 ENCOUNTER — Ambulatory Visit (HOSPITAL_COMMUNITY)
Admission: RE | Admit: 2021-10-30 | Discharge: 2021-10-30 | Disposition: A | Discharge status: Home / Self Care | Payer: BC Managed Care – PPO | Source: Ambulatory Visit | Attending: Family Medicine | Admitting: Family Medicine

## 2021-10-30 ENCOUNTER — Encounter (HOSPITAL_COMMUNITY): Payer: Self-pay

## 2021-10-30 VITALS — BP 126/76 | HR 91 | Wt 257.4 lb

## 2021-10-30 DIAGNOSIS — Z7902 Long term (current) use of antithrombotics/antiplatelets: Secondary | ICD-10-CM | POA: Insufficient documentation

## 2021-10-30 DIAGNOSIS — I255 Ischemic cardiomyopathy: Secondary | ICD-10-CM | POA: Diagnosis not present

## 2021-10-30 DIAGNOSIS — E119 Type 2 diabetes mellitus without complications: Secondary | ICD-10-CM | POA: Diagnosis not present

## 2021-10-30 DIAGNOSIS — Z86718 Personal history of other venous thrombosis and embolism: Secondary | ICD-10-CM | POA: Insufficient documentation

## 2021-10-30 DIAGNOSIS — Z79899 Other long term (current) drug therapy: Secondary | ICD-10-CM | POA: Insufficient documentation

## 2021-10-30 DIAGNOSIS — Z7982 Long term (current) use of aspirin: Secondary | ICD-10-CM | POA: Insufficient documentation

## 2021-10-30 DIAGNOSIS — I4729 Other ventricular tachycardia: Secondary | ICD-10-CM

## 2021-10-30 DIAGNOSIS — I2510 Atherosclerotic heart disease of native coronary artery without angina pectoris: Secondary | ICD-10-CM | POA: Diagnosis not present

## 2021-10-30 DIAGNOSIS — I639 Cerebral infarction, unspecified: Secondary | ICD-10-CM

## 2021-10-30 DIAGNOSIS — Z8673 Personal history of transient ischemic attack (TIA), and cerebral infarction without residual deficits: Secondary | ICD-10-CM | POA: Insufficient documentation

## 2021-10-30 DIAGNOSIS — Z8674 Personal history of sudden cardiac arrest: Secondary | ICD-10-CM | POA: Diagnosis not present

## 2021-10-30 DIAGNOSIS — Z7984 Long term (current) use of oral hypoglycemic drugs: Secondary | ICD-10-CM | POA: Insufficient documentation

## 2021-10-30 DIAGNOSIS — I472 Ventricular tachycardia, unspecified: Secondary | ICD-10-CM | POA: Diagnosis not present

## 2021-10-30 DIAGNOSIS — I5022 Chronic systolic (congestive) heart failure: Secondary | ICD-10-CM

## 2021-10-30 DIAGNOSIS — Z951 Presence of aortocoronary bypass graft: Secondary | ICD-10-CM | POA: Insufficient documentation

## 2021-10-30 DIAGNOSIS — I252 Old myocardial infarction: Secondary | ICD-10-CM | POA: Insufficient documentation

## 2021-10-30 DIAGNOSIS — I110 Hypertensive heart disease with heart failure: Secondary | ICD-10-CM | POA: Diagnosis not present

## 2021-10-30 DIAGNOSIS — N529 Male erectile dysfunction, unspecified: Secondary | ICD-10-CM

## 2021-10-30 NOTE — Progress Notes (Signed)
PCP: Ardith Dark, MD HF Cardiology: Dr. Shirlee Latch  46 y.o. with a h/o VT, CAD, S/P CABG , DMII on insulin,  DVT, CVA in 2013 treated with TPA, PAF, and ischemic cardiomyopathy.    Admitted 03/26/21 with chest pain and near syncope in the setting of NSTEMI. Had VT arrest. Echo showed EF 20-25%. Cath with severe multivessel CAD. S/P CABG x 5. Started on GDMT. Discharged with LifeVest.    Echo in 5/23 showed EF 40-45%, global hypokinesis, mildly decreased RV systolic function.   Patient decreased Entresto to 24/26 bid due to lightheadedness and soft BP.   Today he returns for HF follow up. Overall feeling fine. He recently finished cardiac rehab. He works out at home doing [employer] and cardio 3x/week. 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 No significant dyspnea but has days where is tires quicker. Has occasional LLE swelling, resolves with Lasix. Has occasional stinging/numbness around CABG incision. Had recent dizziness while out in the heat. Denies palpitations, CP, edema, or PND/Orthopnea. Appetite ok. No fever or chills. Weight at home 245-249 pounds. Taking all medications. Takes Lasix once a week.   ECG (personally reviewed): none ordered today.  Labs (5/23): LDL 40, HDL 56 Labs (6/23): K 4.1, creatinine 0.94 Labs (7/23): K 4.2, creatinine 1.27  PMH: 1. HTN 2. Type 2 diabetes 3. CAD: NSTEMI + VT arrest in 1/23.   - CABG x 5 with LIMA-LAD, left radial-PDA, SVG-D1, sequential SVG-ramus and OM.  4. VT: VT arrest with NSTEMI in 1/23.  - Lifevest at discharge.  5. Chronic systolic CHF: Ischemic cardiomyopathy.   - Echo (1/23): EF 20-25%, mild LV dilation, normal RV, IVC normal.  - Echo (5/23): EF 40-45%, global hypokinesis, mildly decreased RV systolic function.  6. CVA 2013: Treated with TPA.  7. 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  Nonsmoker.  Rare ETOH.   FH: Mother with history of PE.  No history of premature  CAD.   ROS: All systems reviewed and negative except as per HPI.   Current Outpatient Medications  Medication Sig Dispense Refill   acetaminophen (TYLENOL) 500 MG tablet Take 1,000 mg by mouth every 6 (six) hours as needed for mild pain, fever or headache.     aspirin 81 MG chewable tablet Chew 81 mg by mouth daily.     atorvastatin (LIPITOR) 80 MG tablet Take 1 tablet (80 mg total) by mouth daily. 30 tablet 12   carvedilol (COREG) 12.5 MG tablet Take 1 tablet (12.5 mg total) by mouth 2 (two) times daily. 180 tablet 3   clopidogrel (PLAVIX) 75 MG tablet Take 1 tablet (75 mg total) by mouth daily. 90 tablet 11   FARXIGA 10 MG TABS tablet TAKE 1 TABLET(10 MG) BY MOUTH DAILY 90 tablet 3   furosemide (LASIX) 20 MG tablet Take 1 tablet (20 mg total) by mouth as needed for fluid or edema. 30 tablet 2   glucose blood (ONETOUCH VERIO) test strip USE TO CHECK BLOOD SUGAR THREE TIMES A DAY BEFORE MEALS AND PRN 100 each 12   insulin aspart (NOVOLOG) 100 UNIT/ML injection INJECT 8 UNITS IF BLOOD SUGAR 71-140, 12 UNITS BLOOD SUGAR 141-200, 16 UNITS BLOOD SUGAR GREATER THAN 200 10 mL PRN   Insulin Pen Needle 29G X 12.7MM MISC 1 Device by Does not apply route daily. 100 each 11   metFORMIN (GLUCOPHAGE-XR) 500 MG 24 hr tablet Take 2  tablets (1,000 mg total) by mouth 2 (two) times daily. 180 tablet 3   ONE TOUCH LANCETS MISC USE TO CHECK BLOOD SUGAR THREE TIMES A BEFORE MEALS AND PRN 100 each 12   oxyCODONE (OXY IR/ROXICODONE) 5 MG immediate release tablet Take 1-2 tablets (5-10 mg total) by mouth every 4 (four) hours as needed for severe pain. 30 tablet 0   pantoprazole (PROTONIX) 40 MG tablet TAKE 1 TABLET(40 MG) BY MOUTH DAILY 30 tablet 5   sacubitril-valsartan (ENTRESTO) 24-26 MG Take 1 tablet by mouth 2 (two) times daily. 60 tablet 3   sildenafil (VIAGRA) 100 MG tablet Take 0.5-1 tablets (50-100 mg total) by mouth daily as needed for erectile dysfunction. 30 tablet 5   spironolactone (ALDACTONE) 25 MG  tablet Take 1 tablet (25 mg total) by mouth every evening. 90 tablet 3   tadalafil (CIALIS) 20 MG tablet Take 0.5-1 tablets (10-20 mg total) by mouth every other day as needed for erectile dysfunction. 10 tablet 11   No current facility-administered medications for this encounter.   Wt Readings from Last 3 Encounters:  10/30/21 116.8 kg (257 lb 6.4 oz)  09/17/21 113.1 kg (249 lb 6.4 oz)  07/31/21 111.3 kg (245 lb 6.4 oz)   BP 126/76   Pulse 91   Wt 116.8 kg (257 lb 6.4 oz)   SpO2 97%   BMI 30.52 kg/m  General:  NAD. No resp difficulty HEENT: Normal Neck: Supple. No JVD. Carotids 2+ bilat; no bruits. No lymphadenopathy or thryomegaly appreciated. Cor: PMI nondisplaced. Regular rate & rhythm. No rubs, gallops or murmurs. Lungs: Clear Abdomen: Soft, nontender, nondistended. No hepatosplenomegaly. No bruits or masses. Good bowel sounds. Extremities: No cyanosis, clubbing, rash, edema Neuro: Alert & oriented x 3, cranial nerves grossly intact. Moves all 4 extremities w/o difficulty. Affect pleasant.  Assessment/Plan: 1. Chronic systolic CHF: Ischemic cardiomyopathy.  Echo in 1/23 with EF 20-25%.  Echo in 5/23 showed EF up to 40-45%.  NYHA I. He is not volume overloaded on exam.  GDMT previously limited by lower BP and fatigue.  - Continue Coreg 12.5 mg bid.  - Continue Farxiga 10 mg daily.  - Continue spironolactone 25 mg daily. Recent labs reviewed, K 4.2, creatinine 1.27 - Continue Entresto 24/26 bid, decreased due to orthostatic symptoms which have improved.  - EF was out of ICD range on 5/23 echo, repeat echo 6 months from that time in 11/23.  2. CAD: Premature CAD, but no family history that he knows of.  NSTEMI in 1/23, cath with 3 vessel disease.  Patient with CABG x 5.  No exertional chest pain.  - Continue ASA 81 daily.   - Continue Plavix 75 mg daily, continue for at least 1 year post-NSTEMI (~02/2022).  - Continue atorvastatin 80 daily, good lipids in 5/23.  - Will need  aggressive secondary prevention.   3. VT: Patient had VT arrest at time of NSTEMI, now s/p revascularization.  EF is now out of ICD range.  - No further arrhythmias have been noted and EF has made some improvement, so he is now off amiodarone. 4. CVA: 2013, treated with TPA.  5. ED: He has Rx for tadalafil and sildenafil. Discussed with patient he should not mix both medications.   Follow up in 2 months with Dr. Shirlee Latch + echo.  [facility] FNP-BC 10/30/2021

## 2021-10-30 NOTE — Patient Instructions (Signed)
It was great to see you today! No medication changes are needed at this time.   Your physician recommends that you schedule a follow-up appointment in: 3 months with Dr Shirlee Latch and echo  Your physician has requested that you have an echocardiogram. Echocardiography is a painless test that uses sound waves to create images of your heart. It provides your doctor with information about the size and shape of your heart and how well your heart's chambers and valves are working. This procedure takes approximately one hour. There are no restrictions for this procedure.   Do the following things EVERYDAY: Weigh yourself in the morning before breakfast. Write it down and keep it in a log. Take your medicines as prescribed Eat low salt foods--Limit salt (sodium) to 2000 mg per day.  Stay as active as you can everyday Limit all fluids for the day to less than 2 liters  At the [facility], you and your health needs are our priority. As part of our continuing mission to provide you with exceptional heart care, we have created designated Provider Care Teams. These Care Teams include your primary Cardiologist (physician) and Advanced Practice Providers (APPs- Physician Assistants and Nurse Practitioners) who all work together to provide you with the care you need, when you need it.   You may see any of the following providers on your designated Care Team at your next follow up: Dr Arvilla Meres Dr Marca Ancona Dr. Marcos Eke, NP Robbie Lis, [place] [facility] [place], [place] Brynda Peon, NP Karle Plumber, PharmD   Please be sure to bring in all your medications bottles to every appointment.

## 2021-10-31 ENCOUNTER — Ambulatory Visit: Payer: BC Managed Care – PPO | Admitting: Family Medicine

## 2021-11-07 ENCOUNTER — Other Ambulatory Visit: Payer: Self-pay | Admitting: *Deleted

## 2021-11-07 MED ORDER — INSULIN ASPART 100 UNIT/ML IJ SOLN: INTRAMUSCULAR | 99 refills | Status: DC

## 2021-11-16 ENCOUNTER — Encounter: Payer: Self-pay | Admitting: Family Medicine

## 2021-11-16 ENCOUNTER — Ambulatory Visit: Payer: BC Managed Care – PPO | Admitting: Family Medicine

## 2021-11-16 VITALS — BP 116/76 | HR 81 | Temp 97.8°F | Ht 77.0 in | Wt 257.0 lb

## 2021-11-16 DIAGNOSIS — E1159 Type 2 diabetes mellitus with other circulatory complications: Secondary | ICD-10-CM

## 2021-11-16 DIAGNOSIS — E1165 Type 2 diabetes mellitus with hyperglycemia: Secondary | ICD-10-CM

## 2021-11-16 DIAGNOSIS — M6530 Trigger finger, unspecified finger: Secondary | ICD-10-CM

## 2021-11-16 DIAGNOSIS — Z1211 Encounter for screening for malignant neoplasm of colon: Secondary | ICD-10-CM

## 2021-11-16 DIAGNOSIS — I152 Hypertension secondary to endocrine disorders: Secondary | ICD-10-CM

## 2021-11-16 DIAGNOSIS — N529 Male erectile dysfunction, unspecified: Secondary | ICD-10-CM

## 2021-11-16 LAB — POCT GLYCOSYLATED HEMOGLOBIN (HGB A1C): Hemoglobin A1C: 7.6 % — AB (ref 4.0–5.6)

## 2021-11-16 MED ORDER — OZEMPIC (0.25 OR 0.5 MG/DOSE) 2 MG/3ML ~~LOC~~ SOPN: 0.2500 mg | PEN_INJECTOR | SUBCUTANEOUS | 5 refills | Status: DC

## 2021-11-16 NOTE — Assessment & Plan Note (Signed)
A1c up slightly to 7.6.  Has had some dietary discretions recently due to restarting at work though is trying to do better with his diet.  He is currently on Farxiga 10 mg daily, metformin 1000 mg twice daily and sliding scale NovoLog.  We will start Ozempic 0.25 mg weekly for 4 weeks and then increase to 0.5 mg weekly.  He will follow-up with me in 3 months and we can adjust the dose of Ozempic as tolerated.  Ideally would be able to stop his insulin soon.

## 2021-11-16 NOTE — Assessment & Plan Note (Signed)
Blood pressure is at goal today on spironolactone 25 mg daily, Coreg 3.125 mg twice daily, and Entresto 24-26 twice daily.

## 2021-11-16 NOTE — Patient Instructions (Signed)
It was very nice to see you today!  Please start the [place].  Take 0.25 mg weekly for 4 weeks and then increase to 0.5 mg weekly.  I will refer you for your colonoscopy.  I will also refer you for your trigger finger.  Please come back to see me in 3 months.  Come back sooner if needed.  Take care, Dr Jerline Pain  PLEASE NOTE:  If you had any lab tests please let us know if you have not heard back within a few days. You may see your results on mychart before we have a chance to review them but we will give you a call once they are reviewed by [place]. If we ordered any referrals today, please let us know if you have not heard from their office within the next week.   Please try these tips to maintain a healthy lifestyle:  Eat at least 3 REAL meals and 1-2 snacks per day.  Aim for no more than 5 hours between eating.  If you eat breakfast, please do so within one hour of getting up.   Each meal should contain half fruits/vegetables, one quarter protein, and one quarter carbs (no bigger than a computer mouse)  Cut down on sweet beverages. This includes juice, soda, and sweet tea.   Drink at least 1 glass of water with each meal and aim for at least 8 glasses per day  Exercise at least 150 minutes every week.

## 2021-11-16 NOTE — Assessment & Plan Note (Signed)
Still has issues with low libido.  He may be interested in checking testosterone.  We can do this at his next office visit.  He uses Viagra and cialis as needed which works reasonably well.

## 2021-11-16 NOTE — Progress Notes (Signed)
   Gene Weeks is a 46 y.o. male who presents today for an office visit.  Assessment/Plan:  New/Acute Problems: Trigger finger We will place referral to sports medicine.  May benefit from steroid injection.  No red flags today.  Chronic Problems Addressed Today: Type 2 diabetes mellitus with hyperglycemia (HCC) A1c up slightly to 7.6.  Has had some dietary discretions recently due to restarting at work though is trying to do better with his diet.  He is currently on Farxiga 10 mg daily, metformin 1000 mg twice daily and sliding scale NovoLog.  We will start Ozempic 0.25 mg weekly for 4 weeks and then increase to 0.5 mg weekly.  He will follow-up with me in 3 months and we can adjust the dose of Ozempic as tolerated.  Ideally would be able to stop his insulin soon.  Hypertension associated with diabetes ([place]) Blood pressure is at goal today on spironolactone 25 mg daily, Coreg 3.125 mg twice daily, and Entresto 24-26 twice daily.  Erectile dysfunction Still has issues with low libido.  He may be interested in checking testosterone.  We can do this at his next office visit.  He uses Viagra and cialis as needed which works reasonably well.  Flu shot given today and will place referral for colonoscopy.    Subjective:  HPI:  See A/P for status of chronic conditions.  He has noticed trigger finger in his left hand.  Some has to force his ring finger open.       Objective:  Physical Exam: BP 116/76   Pulse 81   Temp 97.8 F (36.6 C) (Temporal)   Ht 6\' 5"  (1.956 m)   Wt 257 lb (116.6 kg)   SpO2 99%   BMI 30.48 kg/m   Gen: No acute distress, resting comfortably CV: Regular rate and rhythm with no murmurs appreciated Pulm: Normal work of breathing, clear to auscultation bilaterally with no crackles, wheezes, or rhonchi Neuro: Grossly normal, moves all extremities Psych: Normal affect and thought content      Gene Weeks M. Jerline Pain, MD 11/16/2021 12:04 PM

## 2021-11-18 ENCOUNTER — Other Ambulatory Visit: Payer: Self-pay | Admitting: Family Medicine

## 2021-11-19 ENCOUNTER — Encounter: Payer: Self-pay | Admitting: *Deleted

## 2021-11-26 ENCOUNTER — Encounter: Payer: Self-pay | Admitting: Family Medicine

## 2021-11-26 ENCOUNTER — Encounter (HOSPITAL_COMMUNITY): Payer: Self-pay | Admitting: Cardiology

## 2022-01-04 LAB — HM COLONOSCOPY

## 2022-01-11 ENCOUNTER — Ambulatory Visit (HOSPITAL_COMMUNITY)
Admission: RE | Admit: 2022-01-11 | Discharge: 2022-01-11 | Disposition: A | Discharge status: Home / Self Care | Payer: BC Managed Care – PPO | Source: Ambulatory Visit | Attending: Cardiology | Admitting: Cardiology

## 2022-01-11 ENCOUNTER — Encounter (HOSPITAL_COMMUNITY): Payer: Self-pay | Admitting: Cardiology

## 2022-01-11 ENCOUNTER — Ambulatory Visit (HOSPITAL_BASED_OUTPATIENT_CLINIC_OR_DEPARTMENT_OTHER)
Admission: RE | Admit: 2022-01-11 | Discharge: 2022-01-11 | Disposition: A | Discharge status: Home / Self Care | Payer: BC Managed Care – PPO | Source: Ambulatory Visit | Attending: Cardiology | Admitting: Cardiology

## 2022-01-11 VITALS — BP 110/70 | HR 95 | Wt 248.6 lb

## 2022-01-11 DIAGNOSIS — Z8673 Personal history of transient ischemic attack (TIA), and cerebral infarction without residual deficits: Secondary | ICD-10-CM | POA: Diagnosis not present

## 2022-01-11 DIAGNOSIS — I5022 Chronic systolic (congestive) heart failure: Secondary | ICD-10-CM

## 2022-01-11 DIAGNOSIS — E119 Type 2 diabetes mellitus without complications: Secondary | ICD-10-CM | POA: Insufficient documentation

## 2022-01-11 DIAGNOSIS — I252 Old myocardial infarction: Secondary | ICD-10-CM | POA: Diagnosis not present

## 2022-01-11 DIAGNOSIS — I110 Hypertensive heart disease with heart failure: Secondary | ICD-10-CM | POA: Diagnosis present

## 2022-01-11 DIAGNOSIS — Z86718 Personal history of other venous thrombosis and embolism: Secondary | ICD-10-CM | POA: Insufficient documentation

## 2022-01-11 DIAGNOSIS — Z7982 Long term (current) use of aspirin: Secondary | ICD-10-CM | POA: Insufficient documentation

## 2022-01-11 DIAGNOSIS — Z79899 Other long term (current) drug therapy: Secondary | ICD-10-CM | POA: Insufficient documentation

## 2022-01-11 DIAGNOSIS — E785 Hyperlipidemia, unspecified: Secondary | ICD-10-CM | POA: Diagnosis not present

## 2022-01-11 DIAGNOSIS — I472 Ventricular tachycardia, unspecified: Secondary | ICD-10-CM | POA: Diagnosis not present

## 2022-01-11 DIAGNOSIS — I255 Ischemic cardiomyopathy: Secondary | ICD-10-CM | POA: Diagnosis not present

## 2022-01-11 DIAGNOSIS — Z951 Presence of aortocoronary bypass graft: Secondary | ICD-10-CM | POA: Insufficient documentation

## 2022-01-11 DIAGNOSIS — Z7902 Long term (current) use of antithrombotics/antiplatelets: Secondary | ICD-10-CM | POA: Diagnosis not present

## 2022-01-11 DIAGNOSIS — I2510 Atherosclerotic heart disease of native coronary artery without angina pectoris: Secondary | ICD-10-CM | POA: Insufficient documentation

## 2022-01-11 LAB — BASIC METABOLIC PANEL
Anion gap: 10 (ref 5–15)
BUN: 11 mg/dL (ref 6–20)
CO2: 26 mmol/L (ref 22–32)
Calcium: 9.4 mg/dL (ref 8.9–10.3)
Chloride: 103 mmol/L (ref 98–111)
Creatinine, Ser: 0.98 mg/dL (ref 0.61–1.24)
GFR, Estimated: >60 mL/min (ref >60)
Glucose, Bld: 164 mg/dL — ABNORMAL HIGH (ref 70–99)
Potassium: 4.1 mmol/L (ref 3.5–5.1)
Sodium: 139 mmol/L (ref 135–145)

## 2022-01-11 LAB — ECHOCARDIOGRAM COMPLETE
AR max vel: 4.01 cm2
AV Area VTI: 4.13 cm2
AV Area mean vel: 3.86 cm2
AV Mean grad: 3 mmHg
AV Peak grad: 4.7 mmHg
Ao pk vel: 1.08 m/s
Area-P 1/2: 4.83 cm2
Calc EF: 41.3 %
S' Lateral: 4.4 cm
Single Plane A2C EF: 40.4 %
Single Plane A4C EF: 40.6 %

## 2022-01-11 MED ORDER — ENTRESTO 49-51 MG PO TABS: 1.0000 | ORAL_TABLET | Freq: Two times a day (BID) | ORAL | 11 refills | Status: DC

## 2022-01-11 NOTE — Progress Notes (Signed)
  Echocardiogram 2D Echocardiogram has been performed.  Gerda Diss 01/11/2022, 2:35 PM

## 2022-01-11 NOTE — Patient Instructions (Signed)
Labs done today. We will contact you only if your labs are abnormal.  INCREASE Entresto 49-51mg  (1 tablet) by mouth 2 times daily.   No other medication changes were made. Please continue all current medications as prescribed.  Your physician recommends that you schedule a follow-up appointment in: 10 days for a lab only appointment and in 6 months with our NP/PA Clinic here in our office. Please contact our office in April to schedule a May appointment.   If you have any questions or concerns before your next appointment please send [place] a message through [place] or call our office at [phone].    TO LEAVE A MESSAGE FOR THE NURSE SELECT OPTION 2, PLEASE LEAVE A MESSAGE INCLUDING: YOUR NAME DATE OF BIRTH CALL BACK NUMBER REASON FOR CALL**this is important as we prioritize the call backs  YOU WILL RECEIVE A CALL BACK THE SAME DAY AS LONG AS YOU CALL BEFORE 4:00 PM   Do the following things EVERYDAY: Weigh yourself in the morning before breakfast. Write it down and keep it in a log. Take your medicines as prescribed Eat low salt foods--Limit salt (sodium) to 2000 mg per day.  Stay as active as you can everyday Limit all fluids for the day to less than 2 liters   At the [facility], you and your health needs are our priority. As part of our continuing mission to provide you with exceptional heart care, we have created designated Provider Care Teams. These Care Teams include your primary Cardiologist (physician) and Advanced Practice Providers (APPs- Physician Assistants and Nurse Practitioners) who all work together to provide you with the care you need, when you need it.   You may see any of the following providers on your designated Care Team at your next follow up: Dr Arvilla Meres Dr Carron Curie, NP Robbie Lis, [place] Karle Plumber, PharmD   Please be sure to bring in all your medications bottles to every appointment.

## 2022-01-12 LAB — LIPOPROTEIN A (LPA): Lipoprotein (a): 56 nmol/L — ABNORMAL HIGH (ref <75.0)

## 2022-01-13 NOTE — Progress Notes (Signed)
PCP: Ardith Dark, MD HF Cardiology: Dr. Shirlee Latch  46 y.o. with a h/o VT, CAD, S/P CABG , DMII on insulin,  DVT, CVA in 2013 treated with TPA, PAF, and ischemic cardiomyopathy.    Admitted 03/26/21 with chest pain and near syncope in the setting of NSTEMI. Had VT arrest. Echo showed EF 20-25%. Cath with severe multivessel CAD. S/P CABG x 5. Started on GDMT. Discharged with LifeVest.    Echo in 5/23 showed EF 40-45%, global hypokinesis, mildly decreased RV systolic function.   Patient decreased Entresto to 24/26 bid due to lightheadedness and soft BP.   Echo was done today and reviewed, EF 40-45%, normal RV, normal IVC.   Patient returns for followup of CHF and CAD.  He finished cardiac rehab.  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  No significant exertional dyspnea.  No chest pain.  No orthopnea/PND.  No lightheadedness. Weight is down 9 lbs.   Labs (5/23): LDL 40, HDL 56 Labs (6/23): K 4.1, creatinine 0.94 Labs (7/23): K 4.2, creatinine 1.27, BNP 51  PMH: 1. HTN 2. Type 2 diabetes 3. CAD: NSTEMI + VT arrest in 1/23.   - CABG x 5 with LIMA-LAD, left radial-PDA, SVG-D1, sequential SVG-ramus and OM.  4. VT: VT arrest with NSTEMI in 1/23.  - Lifevest at discharge.  5. Chronic systolic CHF: Ischemic cardiomyopathy.   - Echo (1/23): EF 20-25%, mild LV dilation, normal RV, IVC normal.  - Echo (5/23): EF 40-45%, global hypokinesis, mildly decreased RV systolic function.  - Echo (11/23): EF 40-45%, normal RV, normal IVC.  6. CVA 2013: Treated with TPA.  7. 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  Nonsmoker.  Rare ETOH.   FH: Mother with history of PE.  No history of premature CAD.   ROS: All systems reviewed and negative except as per HPI.   Current Outpatient Medications  Medication Sig Dispense Refill   acetaminophen (TYLENOL) 500 MG tablet Take 1,000 mg by mouth every 6 (six) hours as needed for mild pain, fever or  headache.     aspirin 81 MG chewable tablet Chew 81 mg by mouth daily.     atorvastatin (LIPITOR) 80 MG tablet Take 1 tablet (80 mg total) by mouth daily. 30 tablet 12   carvedilol (COREG) 12.5 MG tablet Take 1 tablet (12.5 mg total) by mouth 2 (two) times daily. 180 tablet 3   clopidogrel (PLAVIX) 75 MG tablet Take 1 tablet (75 mg total) by mouth daily. 90 tablet 11   FARXIGA 10 MG TABS tablet TAKE 1 TABLET(10 MG) BY MOUTH DAILY 90 tablet 3   furosemide (LASIX) 20 MG tablet Take 1 tablet (20 mg total) by mouth as needed for fluid or edema. 30 tablet 2   glucose blood (ONETOUCH VERIO) test strip USE TO CHECK BLOOD SUGAR THREE TIMES A DAY BEFORE MEALS AND PRN 100 each 12   insulin aspart (NOVOLOG) 100 UNIT/ML injection INJECT 8 UNITS IF BLOOD SUGAR 71-140, 12 UNITS BLOOD SUGAR 141-200, 16 UNITS BLOOD SUGAR GREATER THAN 200 10 mL PRN   Insulin Pen Needle 29G X 12.7MM MISC 1 Device by Does not apply route daily. 100 each 11   metFORMIN (GLUCOPHAGE-XR) 500 MG 24 hr tablet Take 2 tablets (1,000 mg total) by mouth 2 (two) times daily. 180 tablet 3   ONE TOUCH LANCETS MISC USE TO CHECK BLOOD SUGAR THREE TIMES A BEFORE MEALS AND PRN  100 each 12   oxyCODONE (OXY IR/ROXICODONE) 5 MG immediate release tablet Take 1-2 tablets (5-10 mg total) by mouth every 4 (four) hours as needed for severe pain. 30 tablet 0   pantoprazole (PROTONIX) 40 MG tablet TAKE 1 TABLET(40 MG) BY MOUTH DAILY 30 tablet 5   sacubitril-valsartan (ENTRESTO) 49-51 MG Take 1 tablet by mouth 2 (two) times daily. 60 tablet 11   Semaglutide,0.25 or 0.5MG /DOS, (OZEMPIC, 0.25 OR 0.5 MG/DOSE,) 2 MG/3ML SOPN Inject 0.25 mg into the skin once a week. Increase to 0.5mg  weekly after 4 weeks. 3 mL 5   sildenafil (VIAGRA) 100 MG tablet Take 0.5-1 tablets (50-100 mg total) by mouth daily as needed for erectile dysfunction. 30 tablet 5   spironolactone (ALDACTONE) 25 MG tablet Take 1 tablet (25 mg total) by mouth every evening. 90 tablet 3   tadalafil  (CIALIS) 20 MG tablet Take 0.5-1 tablets (10-20 mg total) by mouth every other day as needed for erectile dysfunction. 10 tablet 11   No current facility-administered medications for this encounter.   BP 110/70   Pulse 95   Wt 112.8 kg (248 lb 9.6 oz)   SpO2 98%   BMI 29.48 kg/m  General: NAD Neck: No JVD, no thyromegaly or thyroid nodule.  Lungs: Clear to auscultation bilaterally with normal respiratory effort. CV: Nondisplaced PMI.  Heart regular S1/S2, no S3/S4, no murmur.  No peripheral edema.  No carotid bruit.  Normal pedal pulses.  Abdomen: Soft, nontender, no hepatosplenomegaly, no distention.  Skin: Intact without lesions or rashes.  Neurologic: Alert and oriented x 3.  Psych: Normal affect. Extremities: No clubbing or cyanosis.  HEENT: Normal.   Assessment/Plan: 1. Chronic systolic CHF: Ischemic cardiomyopathy.  Echo in 1/23 with EF 20-25%.  Echo in 5/23 showed EF up to 40-45%.  Echo today showed EF 40-45%, stable. Not volume overloaded on exam. NYHA class I-II.   - Continue Coreg 12.5 mg bid.  - Continue Farxiga 10 mg daily.  - Continue spironolactone 25 mg daily.   - We will try again to increase Entresto to 49/51 bid.  He will try to stay well-hydrated.  BMET/BNP today, BMET in 10 days.  - EF out of ICD range.  2. CAD: Premature CAD, but no family history that he knows of.  NSTEMI in 1/23, cath with 3 vessel disease.  Patient with CABG x 5.  No exertional chest pain.  - Continue ASA 81 daily, will drop this 1 year post-NSTEMI.   - Continue Plavix 75 mg daily, continue long-term.   - Continue atorvastatin 80 daily, I will check Lp(a).  - Will need aggressive secondary prevention.   3. VT: Patient had VT arrest at time of NSTEMI, now s/p revascularization.  EF is now out of ICD range.  - No further arrhythmias have been noted and EF has made some improvement.  4. CVA: 2013, treated with TPA.   Followup in 6 wks with APP.   Loralie Champagne 01/13/2022

## 2022-01-23 ENCOUNTER — Ambulatory Visit (HOSPITAL_COMMUNITY)
Admission: RE | Admit: 2022-01-23 | Discharge: 2022-01-23 | Disposition: A | Discharge status: Home / Self Care | Payer: BC Managed Care – PPO | Source: Ambulatory Visit | Attending: Internal Medicine | Admitting: Internal Medicine

## 2022-01-23 DIAGNOSIS — I5022 Chronic systolic (congestive) heart failure: Secondary | ICD-10-CM

## 2022-01-23 LAB — BASIC METABOLIC PANEL
Anion gap: 8 (ref 5–15)
BUN: 15 mg/dL (ref 6–20)
CO2: 25 mmol/L (ref 22–32)
Calcium: 9.4 mg/dL (ref 8.9–10.3)
Chloride: 104 mmol/L (ref 98–111)
Creatinine, Ser: 1.05 mg/dL (ref 0.61–1.24)
GFR, Estimated: >60 mL/min (ref >60)
Glucose, Bld: 164 mg/dL — ABNORMAL HIGH (ref 70–99)
Potassium: 3.9 mmol/L (ref 3.5–5.1)
Sodium: 137 mmol/L (ref 135–145)

## 2022-02-07 ENCOUNTER — Encounter: Payer: Self-pay | Admitting: *Deleted

## 2022-02-13 ENCOUNTER — Ambulatory Visit: Payer: BC Managed Care – PPO | Admitting: Family Medicine

## 2022-03-13 ENCOUNTER — Ambulatory Visit: Payer: BC Managed Care – PPO | Admitting: Family Medicine

## 2022-04-09 ENCOUNTER — Other Ambulatory Visit (HOSPITAL_COMMUNITY): Payer: Self-pay | Admitting: Family Medicine

## 2022-04-15 ENCOUNTER — Ambulatory Visit: Payer: BC Managed Care – PPO | Admitting: Family Medicine

## 2022-04-15 ENCOUNTER — Encounter: Payer: Self-pay | Admitting: Family Medicine

## 2022-04-15 VITALS — BP 105/73 | HR 91 | Temp 97.5°F | Ht 77.0 in | Wt 237.2 lb

## 2022-04-15 DIAGNOSIS — E1165 Type 2 diabetes mellitus with hyperglycemia: Secondary | ICD-10-CM

## 2022-04-15 DIAGNOSIS — E1159 Type 2 diabetes mellitus with other circulatory complications: Secondary | ICD-10-CM

## 2022-04-15 DIAGNOSIS — I152 Hypertension secondary to endocrine disorders: Secondary | ICD-10-CM

## 2022-04-15 DIAGNOSIS — N529 Male erectile dysfunction, unspecified: Secondary | ICD-10-CM

## 2022-04-15 LAB — POCT GLYCOSYLATED HEMOGLOBIN (HGB A1C): Hemoglobin A1C: 8.1 % — AB (ref 4.0–5.6)

## 2022-04-15 LAB — TESTOSTERONE: Testosterone: 460.79 ng/dL (ref 300.00–890.00)

## 2022-04-15 MED ORDER — SEMAGLUTIDE (1 MG/DOSE) 4 MG/3ML ~~LOC~~ SOPN: 1.0000 mg | PEN_INJECTOR | SUBCUTANEOUS | 5 refills | Status: DC

## 2022-04-15 NOTE — Progress Notes (Signed)
   Gene Weeks is a 47 y.o. male who presents today for an office visit.  Assessment/Plan:  New/Acute Problems: Loose Stools May be dietary intolerance.  No red flags.  Discussed with patient metformin could potentially be causing this however patient believes that he has typically been tolerating metformin well.  Has not had any symptoms for last few days.  Will be adding on Ozempic as below which may help.  He will let me know if this persists.  Chronic Problems Addressed Today: Type 2 diabetes mellitus with hyperglycemia (HCC) A1c is not controlled at 8.1.  He has had some dietary indiscretions also.  He is working on improving his diet.  We also discussed importance of exercise.  Will continue Farxiga 10 mg daily, metformin 1000 mg twice daily, and sliding scale NovoLog.  Will increase his Ozempic to 1 mg weekly.  We discussed potential side effects.  He will follow-up in 4 weeks via Gene Weeks let me know if this is working.  He will come back in 3 months and we can recheck A1c at that time.  Hypertension associated with diabetes (Gene Weeks) Blood pressure at goal on current regimen per cardiology with Gene Weeks 49-51 twice daily, Coreg 12.5 mg twice daily, and spironolactone 25 mg daily.  Erectile dysfunction Still having issues including low libido.  He is concerned about low testosterone.  Will check today per patient request.  Discussed with patient that if his testosterone is low he will need to have cardiac clearance before starting supplementation.  Will likely refer him to urology for this if this is the case.  He can continue using Viagra or Cialis as needed.     Subjective:  HPI:  See A/p for status of chronic conditions.  He was last seen here about 5 months ago for his diabetes. At that time his A1c was 7.6. We started him on ozempic and continued him on farxiga 97m daily, metformin 1009mtwice daily, and sliding scale novolog. He does admit to having some dietary indiscretions  and was out of ozempic for awhile due to stock issues.  He has done well with Ozempic.  Feels like it has helped curb his appetite and is also help with weight loss.  He has been compliant with his medications otherwise.  He has had some occasional issues with diarrhea after eating.  Does not think this is due to the metformin.  Thinks may be related to things that he is eating.        Objective:  Physical Exam: BP 105/73   Pulse 91   Temp (!) 97.5 F (36.4 C) (Temporal)   Ht 6' 5"$  (1.956 m)   Wt 237 lb 3.2 oz (107.6 kg)   SpO2 100%   BMI 28.13 kg/m   Wt Readings from Last 3 Encounters:  04/15/22 237 lb 3.2 oz (107.6 kg)  01/11/22 248 lb 9.6 oz (112.8 kg)  11/16/21 257 lb (116.6 kg)    Gen: No acute distress, resting comfortably CV: Regular rate and rhythm with no murmurs appreciated Pulm: Normal work of breathing, clear to auscultation bilaterally with no crackles, wheezes, or rhonchi Neuro: Grossly normal, moves all extremities Psych: Normal affect and thought content      Gene Weeks 04/15/2022 2:06 PM

## 2022-04-15 NOTE — Assessment & Plan Note (Signed)
Still having issues including low libido.  He is concerned about low testosterone.  Will check today per patient request.  Discussed with patient that if his testosterone is low he will need to have cardiac clearance before starting supplementation.  Will likely refer him to urology for this if this is the case.  He can continue using Viagra or Cialis as needed.

## 2022-04-15 NOTE — Assessment & Plan Note (Signed)
Blood pressure at goal on current regimen per cardiology with Delene Loll 49-51 twice daily, Coreg 12.5 mg twice daily, and spironolactone 25 mg daily.

## 2022-04-15 NOTE — Patient Instructions (Signed)
It was very nice to see you today!  We need to work on lowering your A1c.  Please increase your Ozempic to 1 mg weekly.  Please send me a message in a few weeks to let me know how this is working for you.  We will check your testosterone level today.  Please come back to see me in 3 months to recheck your A1c.  Come back sooner if needed.  Take care, Dr Jerline Pain  PLEASE NOTE:  If you had any lab tests, please let us know if you have not heard back within a few days. You may see your results on mychart before we have a chance to review them but we will give you a call once they are reviewed by [place].   If we ordered any referrals today, please let us know if you have not heard from their office within the next week.   If you had any urgent prescriptions sent in today, please check with the pharmacy within an hour of our visit to make sure the prescription was transmitted appropriately.   Please try these tips to maintain a healthy lifestyle:  Eat at least 3 REAL meals and 1-2 snacks per day.  Aim for no more than 5 hours between eating.  If you eat breakfast, please do so within one hour of getting up.   Each meal should contain half fruits/vegetables, one quarter protein, and one quarter carbs (no bigger than a computer mouse)  Cut down on sweet beverages. This includes juice, soda, and sweet tea.   Drink at least 1 glass of water with each meal and aim for at least 8 glasses per day  Exercise at least 150 minutes every week.

## 2022-04-15 NOTE — Assessment & Plan Note (Signed)
A1c is not controlled at 8.1.  He has had some dietary indiscretions also.  He is working on improving his diet.  We also discussed importance of exercise.  Will continue Farxiga 10 mg daily, metformin 1000 mg twice daily, and sliding scale NovoLog.  Will increase his Ozempic to 1 mg weekly.  We discussed potential side effects.  He will follow-up in 4 weeks via MyChart let me know if this is working.  He will come back in 3 months and we can recheck A1c at that time.

## 2022-04-16 ENCOUNTER — Telehealth: Payer: Self-pay | Admitting: Family Medicine

## 2022-04-18 NOTE — Telephone Encounter (Signed)
[employer] back for more information and got connected with Olivia Mackie.   Olivia Mackie states: -Patient was pronounced deceased on 04/25/2022 @ 3:29am. The cause of death is unknown to her as well as the place of death  - [facility] who can be reached at [phone] - They are not able to send death certificate, this needs to be created in [place] and handled between PCP and funeral home   Olivia Mackie stated that [facility] who I originally spoke with on 04/25/22, would have information on COD and place of death. [facility] can be reached at [phone]. Olivia Mackie can be reached at [phone].

## 2022-04-19 NOTE — Telephone Encounter (Signed)
Call [facility] at [phone] Spoke with Director stated got all the information yesterday. Will let us know if Death certificate need to be sign by PCP

## 2022-04-25 ENCOUNTER — Encounter: Payer: Self-pay | Admitting: Family Medicine

## 2022-04-26 NOTE — Telephone Encounter (Signed)
See note

## 2022-04-26 NOTE — Telephone Encounter (Signed)
Caller states: - Last night patient passed away at home most likely due to cardiac event but this hasn't been confirmed  - Now that PCP is confirmed they will send over death certificate  - If more information is needed, should check with significant other

## 2022-04-26 NOTE — Telephone Encounter (Signed)
We can complete death certificate.  Algis Greenhouse. Jerline Pain, MD 03/31/2022 3:33 PM

## 2022-04-26 DEATH — deceased

## 2022-05-11 ENCOUNTER — Other Ambulatory Visit: Payer: Self-pay | Admitting: Family Medicine

## 2022-05-29 ENCOUNTER — Other Ambulatory Visit: Payer: Self-pay | Admitting: Family Medicine

## 2022-05-29 DIAGNOSIS — I5020 Unspecified systolic (congestive) heart failure: Secondary | ICD-10-CM

## 2022-05-31 NOTE — Telephone Encounter (Signed)
Patient's mother called stating that [facility] is requesting a PCP signed death certificate. She requests a callback when this has been completed and sent off.

## 2022-05-31 NOTE — Telephone Encounter (Signed)
Spoke with patient mother, notified Death Certificate was sign. Contact [facility] to request Certificate

## 2022-07-15 ENCOUNTER — Ambulatory Visit: Payer: BC Managed Care – PPO | Admitting: Family Medicine
